# Patient Record
Sex: Male | Born: 1940
Health system: Southern US, Community
[De-identification: ages and names within clinical notes are randomized; demographics above are authoritative.]

## PROBLEM LIST (undated history)

## (undated) DIAGNOSIS — M1990 Unspecified osteoarthritis, unspecified site: Secondary | ICD-10-CM

## (undated) DIAGNOSIS — I714 Abdominal aortic aneurysm, without rupture, unspecified: Secondary | ICD-10-CM

## (undated) DIAGNOSIS — I10 Essential (primary) hypertension: Secondary | ICD-10-CM

## (undated) DIAGNOSIS — C61 Malignant neoplasm of prostate: Secondary | ICD-10-CM

## (undated) DIAGNOSIS — E119 Type 2 diabetes mellitus without complications: Secondary | ICD-10-CM

---

## 2006-07-21 ENCOUNTER — Ambulatory Visit: Payer: Self-pay | Admitting: Internal Medicine

## 2006-07-21 ENCOUNTER — Ambulatory Visit (HOSPITAL_COMMUNITY): Admission: RE | Admit: 2006-07-21 | Discharge: 2006-07-21 | Payer: Self-pay | Admitting: Internal Medicine

## 2006-07-21 ENCOUNTER — Encounter (INDEPENDENT_AMBULATORY_CARE_PROVIDER_SITE_OTHER): Payer: Self-pay | Admitting: Specialist

## 2009-11-07 HISTORY — PX: PROSTATECTOMY: SHX69

## 2010-05-06 ENCOUNTER — Encounter: Payer: Self-pay | Admitting: Urology

## 2010-05-06 ENCOUNTER — Inpatient Hospital Stay (HOSPITAL_COMMUNITY): Admission: RE | Admit: 2010-05-06 | Discharge: 2010-05-07 | Payer: Self-pay | Admitting: Urology

## 2011-01-23 LAB — TYPE AND SCREEN: Antibody Screen: NEGATIVE

## 2011-01-23 LAB — GLUCOSE, CAPILLARY
Glucose-Capillary: 146 mg/dL — ABNORMAL HIGH (ref 70–99)
Glucose-Capillary: 89 mg/dL (ref 70–99)
Glucose-Capillary: 146 mg/dL — ABNORMAL HIGH (ref 70–99)

## 2011-01-23 LAB — COMPREHENSIVE METABOLIC PANEL
ALT: 29 U/L (ref 0–53)
AST: 23 U/L (ref 0–37)
Albumin: 4.1 g/dL (ref 3.5–5.2)
Calcium: 9.7 mg/dL (ref 8.4–10.5)
Chloride: 106 mEq/L (ref 96–112)
Creatinine, Ser: 1.09 mg/dL (ref 0.4–1.5)
GFR calc Af Amer: >60 mL/min (ref >60)
Sodium: 140 mEq/L (ref 135–145)

## 2011-01-23 LAB — SURGICAL PCR SCREEN: MRSA, PCR: NEGATIVE

## 2011-03-25 NOTE — Op Note (Signed)
NAME:  Charles Medina, Charles Medina                  ACCOUNT NO.:  [id number]   MEDICAL RECORD NO.:  [id number]          PATIENT TYPE:  AMB   LOCATION:  DAY                           FACILITY:  [facility]   PHYSICIAN:  Lionel December, M.D.    DATE OF BIRTH:  07-03-1941   DATE OF PROCEDURE:  07/21/2006  DATE OF DISCHARGE:                                 OPERATIVE REPORT   PROCEDURE:  Colonoscopy.   INDICATIONS FOR PROCEDURE:  The patient is a 70 year old African American  male who is undergoing screening colonoscopy.  The procedure and risks were  reviewed with the patient and informed consent was obtained.   MEDICATIONS FOR CONSCIOUS SEDATION:  Demerol 50 mg IV, Versed 2 mg IV.   FINDINGS:  The procedure is performed in the endoscopy suite.  The patient's  vital signs and O2 saturations were monitored during the procedure and  remained stable.  The patient was placed in the left lateral position and  rectal examination performed.  No abnormality noted on external or digital  exam.  The Olympus videoscope was placed in the rectum and advanced under  vision in the sigmoid colon and beyond.  The preparation was excellent.  There were a few tiny diverticula at the sigmoid colon and two more at the  transverse colon.  The scope was advanced to the cecum which was identified  by the appendiceal orifice and ileocecal valve.  Pictures were taken for the  record.  As the scope was withdrawn, colonic mucosa was once again carefully  examined.  There were three tiny polyps at the splenic flexure, possibly  hyperplastic.  These were ablated via cold biopsy and submitted in one  container.  The mucosa of the rest of the colon was normal.  The rectal  mucosa, similarly, was normal.  The scope was retroflexed to examine the  anorectal junction which was unremarkable.  The endoscope was straightened  and withdrawn.  The patient tolerated the procedure well.   FINAL DIAGNOSIS:  1. A few small diverticula at the sigmoid  colon and transverse colon.  2. Three tiny polyps at the splenic flexure which were ablated by cold      biopsy and submitted in one container.   RECOMMENDATIONS:  1. Standard instructions given.  2. High fiber diet.  3. I will be contacting the patient with results of the biopsy and further      recommendations, if any.      Lionel December, M.D.  Electronically Signed     NR/MEDQ  D:  07/21/2006  T:  07/22/2006  Job:  [id number]   cc:   Lorin Picket A. Gerda Diss, MD  Fax: [phone]

## 2011-05-27 ENCOUNTER — Ambulatory Visit (INDEPENDENT_AMBULATORY_CARE_PROVIDER_SITE_OTHER): Payer: Medicare Other | Admitting: Urology

## 2011-05-27 DIAGNOSIS — Z8546 Personal history of malignant neoplasm of prostate: Secondary | ICD-10-CM

## 2011-05-27 DIAGNOSIS — N529 Male erectile dysfunction, unspecified: Secondary | ICD-10-CM

## 2011-05-27 DIAGNOSIS — N393 Stress incontinence (female) (male): Secondary | ICD-10-CM

## 2011-07-12 ENCOUNTER — Encounter (INDEPENDENT_AMBULATORY_CARE_PROVIDER_SITE_OTHER): Payer: Self-pay | Admitting: *Deleted

## 2012-01-02 DIAGNOSIS — Z8546 Personal history of malignant neoplasm of prostate: Secondary | ICD-10-CM | POA: Diagnosis not present

## 2012-01-05 DIAGNOSIS — E119 Type 2 diabetes mellitus without complications: Secondary | ICD-10-CM | POA: Diagnosis not present

## 2012-01-05 DIAGNOSIS — Z794 Long term (current) use of insulin: Secondary | ICD-10-CM | POA: Diagnosis not present

## 2012-01-27 ENCOUNTER — Ambulatory Visit (INDEPENDENT_AMBULATORY_CARE_PROVIDER_SITE_OTHER): Payer: Medicare Other | Admitting: Urology

## 2012-01-27 DIAGNOSIS — N529 Male erectile dysfunction, unspecified: Secondary | ICD-10-CM

## 2012-01-27 DIAGNOSIS — Z8546 Personal history of malignant neoplasm of prostate: Secondary | ICD-10-CM | POA: Diagnosis not present

## 2012-01-27 DIAGNOSIS — N393 Stress incontinence (female) (male): Secondary | ICD-10-CM | POA: Diagnosis not present

## 2012-07-24 DIAGNOSIS — Z8546 Personal history of malignant neoplasm of prostate: Secondary | ICD-10-CM | POA: Diagnosis not present

## 2012-07-27 ENCOUNTER — Ambulatory Visit (INDEPENDENT_AMBULATORY_CARE_PROVIDER_SITE_OTHER): Payer: Medicare Other | Admitting: Urology

## 2012-07-27 DIAGNOSIS — N529 Male erectile dysfunction, unspecified: Secondary | ICD-10-CM

## 2012-07-27 DIAGNOSIS — N393 Stress incontinence (female) (male): Secondary | ICD-10-CM

## 2012-07-27 DIAGNOSIS — Z8546 Personal history of malignant neoplasm of prostate: Secondary | ICD-10-CM

## 2012-08-21 DIAGNOSIS — Z23 Encounter for immunization: Secondary | ICD-10-CM | POA: Diagnosis not present

## 2012-08-21 DIAGNOSIS — Z79899 Other long term (current) drug therapy: Secondary | ICD-10-CM | POA: Diagnosis not present

## 2012-08-21 DIAGNOSIS — E785 Hyperlipidemia, unspecified: Secondary | ICD-10-CM | POA: Diagnosis not present

## 2012-08-21 DIAGNOSIS — E119 Type 2 diabetes mellitus without complications: Secondary | ICD-10-CM | POA: Diagnosis not present

## 2012-08-21 DIAGNOSIS — E782 Mixed hyperlipidemia: Secondary | ICD-10-CM | POA: Diagnosis not present

## 2012-08-21 DIAGNOSIS — I10 Essential (primary) hypertension: Secondary | ICD-10-CM | POA: Diagnosis not present

## 2012-08-21 DIAGNOSIS — E1129 Type 2 diabetes mellitus with other diabetic kidney complication: Secondary | ICD-10-CM | POA: Diagnosis not present

## 2012-09-05 ENCOUNTER — Other Ambulatory Visit (INDEPENDENT_AMBULATORY_CARE_PROVIDER_SITE_OTHER): Payer: Self-pay | Admitting: *Deleted

## 2012-09-05 ENCOUNTER — Telehealth (INDEPENDENT_AMBULATORY_CARE_PROVIDER_SITE_OTHER): Payer: Self-pay | Admitting: *Deleted

## 2012-09-05 ENCOUNTER — Encounter (INDEPENDENT_AMBULATORY_CARE_PROVIDER_SITE_OTHER): Payer: Self-pay | Admitting: *Deleted

## 2012-09-05 DIAGNOSIS — Z86010 Personal history of colonic polyps: Secondary | ICD-10-CM

## 2012-09-05 NOTE — Telephone Encounter (Signed)
Patient needs movi prep 

## 2012-09-07 MED ORDER — PEG-KCL-NACL-NASULF-NA ASC-C 100 G PO SOLR: 1.0000 | Freq: Once | ORAL | Status: DC

## 2012-10-17 ENCOUNTER — Telehealth (INDEPENDENT_AMBULATORY_CARE_PROVIDER_SITE_OTHER): Payer: Self-pay | Admitting: *Deleted

## 2012-10-17 NOTE — Telephone Encounter (Signed)
agree

## 2012-10-17 NOTE — Telephone Encounter (Signed)
  Procedure: tcs  Reason/Indication:  Hx polyps  Has patient had this procedure before?  yes  If so, when, by whom and where?  5 yrs ago ([facility])  Is there a family history of colon cancer?  no  Who?  What age when diagnosed?    Is patient diabetic?   yes      Does patient have prosthetic heart valve?  no  Do you have a pacemaker?  no  Has patient had joint replacement within last 12 months?  no  Is patient on Coumadin, Plavix and/or Aspirin? no  Medications: glyburide 2.5 mg 2 tab in morning and 1 tab in evening, metformin 1000 mg daily, lisinopril 10 mg daily, pravastatin 80 mg daily  Allergies: nkda  Medication Adjustment: hold evening dose of glyburide evening before, 1/2 metformin day before, hold both morning of   Procedure date & time: 11/14/12 at 830

## 2012-10-29 ENCOUNTER — Encounter (HOSPITAL_COMMUNITY): Payer: Self-pay | Admitting: Pharmacy Technician

## 2012-11-14 ENCOUNTER — Ambulatory Visit (HOSPITAL_COMMUNITY)
Admission: RE | Admit: 2012-11-14 | Discharge: 2012-11-14 | Disposition: A | Discharge status: Home / Self Care | Payer: Medicare Other | Source: Ambulatory Visit | Attending: Internal Medicine | Admitting: Internal Medicine

## 2012-11-14 ENCOUNTER — Encounter (HOSPITAL_COMMUNITY): Payer: Self-pay | Admitting: *Deleted

## 2012-11-14 ENCOUNTER — Encounter (HOSPITAL_COMMUNITY)
Admission: RE | Disposition: A | Discharge status: Home / Self Care | Payer: Self-pay | Source: Ambulatory Visit | Attending: Internal Medicine

## 2012-11-14 DIAGNOSIS — K644 Residual hemorrhoidal skin tags: Secondary | ICD-10-CM | POA: Diagnosis not present

## 2012-11-14 DIAGNOSIS — D126 Benign neoplasm of colon, unspecified: Secondary | ICD-10-CM

## 2012-11-14 DIAGNOSIS — K648 Other hemorrhoids: Secondary | ICD-10-CM | POA: Diagnosis not present

## 2012-11-14 DIAGNOSIS — E119 Type 2 diabetes mellitus without complications: Secondary | ICD-10-CM | POA: Insufficient documentation

## 2012-11-14 DIAGNOSIS — K5730 Diverticulosis of large intestine without perforation or abscess without bleeding: Secondary | ICD-10-CM | POA: Diagnosis not present

## 2012-11-14 DIAGNOSIS — I10 Essential (primary) hypertension: Secondary | ICD-10-CM | POA: Diagnosis not present

## 2012-11-14 DIAGNOSIS — Z860100 Personal history of colon polyps, unspecified: Secondary | ICD-10-CM | POA: Insufficient documentation

## 2012-11-14 HISTORY — PX: COLONOSCOPY: SHX5424

## 2012-11-14 HISTORY — DX: Unspecified osteoarthritis, unspecified site: M19.90

## 2012-11-14 HISTORY — DX: Type 2 diabetes mellitus without complications: E11.9

## 2012-11-14 HISTORY — DX: Essential (primary) hypertension: I10

## 2012-11-14 SURGERY — COLONOSCOPY
Anesthesia: Moderate Sedation

## 2012-11-14 MED ORDER — MEPERIDINE HCL 50 MG/ML IJ SOLN
INTRAMUSCULAR | Status: AC
  Filled 2012-11-14: qty 1

## 2012-11-14 MED ORDER — MIDAZOLAM HCL 5 MG/5ML IJ SOLN
INTRAMUSCULAR | Status: DC | PRN
  Administered 2012-11-14 (×2): 2 mg via INTRAVENOUS

## 2012-11-14 MED ORDER — MIDAZOLAM HCL 5 MG/5ML IJ SOLN
INTRAMUSCULAR | Status: AC
  Filled 2012-11-14: qty 10

## 2012-11-14 MED ORDER — STERILE WATER FOR IRRIGATION IR SOLN
Status: DC | PRN
  Administered 2012-11-14: 09:00:00

## 2012-11-14 MED ORDER — SODIUM CHLORIDE 0.45 % IV SOLN
INTRAVENOUS | Status: DC
  Administered 2012-11-14: 1000 mL via INTRAVENOUS

## 2012-11-14 MED ORDER — MEPERIDINE HCL 50 MG/ML IJ SOLN
INTRAMUSCULAR | Status: DC | PRN
  Administered 2012-11-14 (×2): 25 mg via INTRAVENOUS

## 2012-11-14 NOTE — H&P (Signed)
Charles Medina is an 72 y.o. male.   Chief Complaint: Patient is here for colonoscopy. HPI: Patient is 72 year old African male with history of colonic adenomas and is here for surveillance colonoscopy. His last exam was in September 2007 with removal of 3 small polyps and 2 were adenomatous. He feels well. He denies abdominal pain change in his bowel habits or rectal bleeding. Family history is negative for colorectal carcinoma. He has history of prostate CA and had robotic surgery in 2012 and remains in remission.  Past Medical History  Diagnosis Date  . Hypertension   . Diabetes mellitus without complication   . Arthritis     Past Surgical History  Procedure Date  . Prostatectomy 2012    History reviewed. No pertinent family history. Social History:  reports that he quit smoking about 29 years ago. He has never used smokeless tobacco. He reports that he does not drink alcohol or use illicit drugs.  Allergies: No Known Allergies  Medications Prior to Admission  Medication Sig Dispense Refill  . glyBURIDE (DIABETA) 2.5 MG tablet Take 2.5-5 mg by mouth 2 (two) times daily. 2 in the morning and 1 at night.      Marland Kitchen lisinopril (PRINIVIL,ZESTRIL) 10 MG tablet Take 10 mg by mouth daily.      . metFORMIN (GLUCOPHAGE) 1000 MG tablet Take 1,000 mg by mouth daily with breakfast.      . peg 3350 powder (MOVIPREP) 100 G SOLR Take 1 kit (100 g total) by mouth once.  1 kit  0  . pravastatin (PRAVACHOL) 80 MG tablet Take 80 mg by mouth daily.         No results found for this or any previous visit (from the past 48 hour(s)). No results found.  ROS  Blood pressure 158/87, pulse 72, temperature 98 F (36.7 C), temperature source Oral, resp. rate 18, height 6' (1.829 m), weight 186 lb (84.369 kg), SpO2 99.00%. Physical Exam  Constitutional: He appears well-developed and well-nourished.  HENT:  Mouth/Throat: Oropharynx is clear and moist.  Eyes: Conjunctivae normal are normal. No scleral  icterus.  Neck: No thyromegaly present.  Cardiovascular: Normal rate, regular rhythm and normal heart sounds.   No murmur heard. Respiratory: Effort normal and breath sounds normal.  GI: Soft. He exhibits no distension and no mass. There is no tenderness.  Musculoskeletal: He exhibits no edema.  Lymphadenopathy:    He has no cervical adenopathy.  Neurological: He is alert.  Skin: Skin is warm and dry.     Assessment/Plan History of colonic adenomas. Surveillance colonoscopy.  REHMAN,NAJEEB U 11/14/2012, 8:37 AM

## 2012-11-14 NOTE — Op Note (Signed)
COLONOSCOPY PROCEDURE REPORT  PATIENT:  Charles Medina  MR#:  [id number] Birthdate:  Jan 12, 1941, 72 y.o., male Endoscopist:  Dr. Malissa Hippo, MD Referred By:  Dr. Lilyan Punt, MD Procedure Date: 11/14/2012  Procedure:   Colonoscopy  Indications:  Patient is 72 year old African male with history of colonic adenomas. His last exam was in September 2007.  Informed Consent:  The procedure and risks were reviewed with the patient and informed consent was obtained.  Medications:  Demerol 50 mg IV Versed 4 mg IV  Description of procedure:  After a digital rectal exam was performed, that colonoscope was advanced from the anus through the rectum and colon to the area of the cecum, ileocecal valve and appendiceal orifice. The cecum was deeply intubated. These structures were well-seen and photographed for the record. From the level of the cecum and ileocecal valve, the scope was slowly and cautiously withdrawn. The mucosal surfaces were carefully surveyed utilizing scope tip to flexion to facilitate fold flattening as needed. The scope was pulled down into the rectum where a thorough exam including retroflexion was performed.  Findings:   Prep excellent. Scattered diverticula throughout the colon. Two small polyps ablated via cold biopsy from ascending colon and submitted together. Another small polyp ablated via cold biopsy from transverse colon. Normal rectal mucosa. Small hemorrhoids above and below the dentate line.  Therapeutic/Diagnostic Maneuvers Performed:  See above  Complications:  None  Cecal Withdrawal Time:  19 minutes  Impression:  Examination performed to cecum. Pancolonic diverticulosis(few diverticula scattered throughout the colon). Three small polyps ablated via cold biopsy;  two of these were ascending colon and submitted together. Third polyp was at transverse colon. Small internal/external hemorrhoids.  Recommendations:  Standard instructions given. I will contact  patient with results of biopsy and further recommendations.  Charles Medina  11/14/2012 9:22 AM  CC: Dr. Lilyan Punt, MD & Dr. No ref. provider found

## 2012-11-19 ENCOUNTER — Encounter (HOSPITAL_COMMUNITY): Payer: Self-pay | Admitting: Internal Medicine

## 2012-11-21 ENCOUNTER — Encounter (INDEPENDENT_AMBULATORY_CARE_PROVIDER_SITE_OTHER): Payer: Self-pay | Admitting: *Deleted

## 2013-01-28 ENCOUNTER — Encounter: Payer: Self-pay | Admitting: *Deleted

## 2013-01-28 DIAGNOSIS — Z8546 Personal history of malignant neoplasm of prostate: Secondary | ICD-10-CM | POA: Diagnosis not present

## 2013-01-28 DIAGNOSIS — E785 Hyperlipidemia, unspecified: Secondary | ICD-10-CM | POA: Insufficient documentation

## 2013-01-28 DIAGNOSIS — I10 Essential (primary) hypertension: Secondary | ICD-10-CM | POA: Insufficient documentation

## 2013-02-01 ENCOUNTER — Ambulatory Visit (INDEPENDENT_AMBULATORY_CARE_PROVIDER_SITE_OTHER): Payer: Medicare Other | Admitting: Urology

## 2013-02-01 DIAGNOSIS — C61 Malignant neoplasm of prostate: Secondary | ICD-10-CM | POA: Diagnosis not present

## 2013-02-01 DIAGNOSIS — N529 Male erectile dysfunction, unspecified: Secondary | ICD-10-CM

## 2013-02-01 DIAGNOSIS — Z8546 Personal history of malignant neoplasm of prostate: Secondary | ICD-10-CM | POA: Diagnosis not present

## 2013-02-01 DIAGNOSIS — N393 Stress incontinence (female) (male): Secondary | ICD-10-CM | POA: Diagnosis not present

## 2013-02-19 ENCOUNTER — Other Ambulatory Visit: Payer: Self-pay | Admitting: *Deleted

## 2013-02-19 MED ORDER — PRAVASTATIN SODIUM 80 MG PO TABS: 80.0000 mg | ORAL_TABLET | Freq: Every day | ORAL | Status: DC

## 2013-02-19 MED ORDER — LISINOPRIL 10 MG PO TABS: 10.0000 mg | ORAL_TABLET | Freq: Every day | ORAL | Status: DC

## 2013-02-19 MED ORDER — GLYBURIDE 2.5 MG PO TABS: 2.5000 mg | ORAL_TABLET | Freq: Two times a day (BID) | ORAL | Status: DC

## 2013-02-19 MED ORDER — GLYBURIDE 5 MG PO TABS: 5.0000 mg | ORAL_TABLET | Freq: Every day | ORAL | Status: DC

## 2013-02-28 ENCOUNTER — Ambulatory Visit (INDEPENDENT_AMBULATORY_CARE_PROVIDER_SITE_OTHER): Payer: Medicare Other | Admitting: Family Medicine

## 2013-02-28 ENCOUNTER — Encounter: Payer: Self-pay | Admitting: Family Medicine

## 2013-02-28 VITALS — BP 148/88 | Wt 186.2 lb

## 2013-02-28 DIAGNOSIS — I10 Essential (primary) hypertension: Secondary | ICD-10-CM | POA: Diagnosis not present

## 2013-02-28 DIAGNOSIS — E785 Hyperlipidemia, unspecified: Secondary | ICD-10-CM

## 2013-02-28 LAB — POCT GLYCOSYLATED HEMOGLOBIN (HGB A1C): Hemoglobin A1C: 8

## 2013-02-28 MED ORDER — GLIPIZIDE 5 MG PO TABS: 5.0000 mg | ORAL_TABLET | Freq: Two times a day (BID) | ORAL | Status: DC

## 2013-02-28 NOTE — Patient Instructions (Addendum)
Increase your glipizide to one tablet in am and one with supper Continue the rest as is Follow up in 3 to 4 months Do your labs please

## 2013-02-28 NOTE — Progress Notes (Signed)
  Subjective:    Patient ID: Charles Medina, male    DOB: 09-16-1941, 72 y.o.   MRN: [id number]  Diabetes He presents for his follow-up diabetic visit. He has type 2 diabetes mellitus. The initial diagnosis of diabetes was made 9 years ago. His disease course has been stable. There are no hypoglycemic associated symptoms. Pertinent negatives for diabetes include no blurred vision, no chest pain, no foot paresthesias, no foot ulcerations, no polydipsia, no polyuria, no weakness and no weight loss. Symptoms are stable. There are no diabetic complications. Risk factors for coronary artery disease include dyslipidemia, diabetes mellitus and male sex. Current diabetic treatment includes diet and oral agent (dual therapy). He is compliant with treatment all of the time. His weight is stable. He is following a diabetic and generally healthy diet. Meal planning includes avoidance of concentrated sweets. He participates in exercise intermittently. His breakfast blood glucose is taken between 6-7 am. His breakfast blood glucose range is generally 110-130 mg/dl. An ACE inhibitor/angiotensin II receptor blocker is being taken. He does not see a podiatrist.Eye exam is current.      Review of Systems  Constitutional: Negative for weight loss.  Eyes: Negative for blurred vision.  Cardiovascular: Negative for chest pain.  Endocrine: Negative for polydipsia and polyuria.  Neurological: Negative for weakness.       Objective:   Physical Exam  Vitals reviewed. Constitutional: He appears well-developed and well-nourished.  HENT:  Head: Normocephalic and atraumatic.  Right Ear: External ear normal.  Left Ear: External ear normal.  Nose: Nose normal.  Mouth/Throat: Oropharynx is clear and moist.  Eyes: EOM are normal. Pupils are equal, round, and reactive to light.  Neck: Normal range of motion. Neck supple. No thyromegaly present.  Cardiovascular: Normal rate, regular rhythm and normal heart sounds.   No  murmur heard. Pulmonary/Chest: Effort normal and breath sounds normal. No respiratory distress. He has no wheezes.  Abdominal: Soft. Bowel sounds are normal. He exhibits no distension and no mass. There is no tenderness.  Musculoskeletal: Normal range of motion. He exhibits no edema.  Diabetic foot exam was completed. He does not have any ulcers. In no neuropathy. He does have bunions and calluses. Some foot deformity 2. He does take good care of his feet.  Lymphadenopathy:    He has no cervical adenopathy.  Neurological: He is alert. He exhibits normal muscle tone.  Skin: Skin is warm and dry. No erythema.  Psychiatric: He has a normal mood and affect. His behavior is normal. Judgment normal.          Assessment & Plan:  Type II or unspecified type diabetes mellitus without mention of complication, uncontrolled - Plan: POCT glycosylated hemoglobin (Hb A1C), Microalbumin, urine  Hypertension - Plan: Basic metabolic panel  Other and unspecified hyperlipidemia - Plan: Lipid panel, Hepatic function panel  This patient was encouraged to increase his medication. Glipizide 5 mg tablet in the morning and again at suppertime. He is continue his other medicines. In addition to this he will do his lab work. I like to see him back in 3-4 months to check hemoglobin A1c at that time.

## 2013-03-12 DIAGNOSIS — E785 Hyperlipidemia, unspecified: Secondary | ICD-10-CM | POA: Diagnosis not present

## 2013-03-12 DIAGNOSIS — I10 Essential (primary) hypertension: Secondary | ICD-10-CM | POA: Diagnosis not present

## 2013-03-12 LAB — BASIC METABOLIC PANEL
BUN: 14 mg/dL (ref 6–23)
CO2: 24 mEq/L (ref 19–32)
Calcium: 9.2 mg/dL (ref 8.4–10.5)
Creat: 1.09 mg/dL (ref 0.50–1.35)
Glucose, Bld: 117 mg/dL — ABNORMAL HIGH (ref 70–99)
Sodium: 139 mEq/L (ref 135–145)

## 2013-03-12 LAB — LIPID PANEL
Cholesterol: 155 mg/dL (ref 0–200)
HDL: 30 mg/dL — ABNORMAL LOW (ref >39)
Total CHOL/HDL Ratio: 5.2 Ratio
Triglycerides: 56 mg/dL (ref <150)

## 2013-03-12 LAB — HEPATIC FUNCTION PANEL
Albumin: 4.3 g/dL (ref 3.5–5.2)
Alkaline Phosphatase: 62 U/L (ref 39–117)
Total Protein: 7.1 g/dL (ref 6.0–8.3)

## 2013-03-13 ENCOUNTER — Other Ambulatory Visit: Payer: Self-pay | Admitting: *Deleted

## 2013-03-13 MED ORDER — ATORVASTATIN CALCIUM 40 MG PO TABS: 40.0000 mg | ORAL_TABLET | Freq: Every day | ORAL | Status: DC

## 2013-05-02 DIAGNOSIS — H524 Presbyopia: Secondary | ICD-10-CM | POA: Diagnosis not present

## 2013-05-02 DIAGNOSIS — E119 Type 2 diabetes mellitus without complications: Secondary | ICD-10-CM | POA: Diagnosis not present

## 2013-05-02 DIAGNOSIS — H5200 Hypermetropia, unspecified eye: Secondary | ICD-10-CM | POA: Diagnosis not present

## 2013-05-02 DIAGNOSIS — H52229 Regular astigmatism, unspecified eye: Secondary | ICD-10-CM | POA: Diagnosis not present

## 2013-05-13 ENCOUNTER — Other Ambulatory Visit: Payer: Self-pay | Admitting: Family Medicine

## 2013-05-30 ENCOUNTER — Ambulatory Visit (INDEPENDENT_AMBULATORY_CARE_PROVIDER_SITE_OTHER): Payer: Medicare Other | Admitting: Family Medicine

## 2013-05-30 ENCOUNTER — Encounter: Payer: Self-pay | Admitting: Family Medicine

## 2013-05-30 VITALS — BP 130/80 | Wt 183.4 lb

## 2013-05-30 DIAGNOSIS — E119 Type 2 diabetes mellitus without complications: Secondary | ICD-10-CM | POA: Diagnosis not present

## 2013-05-30 DIAGNOSIS — I10 Essential (primary) hypertension: Secondary | ICD-10-CM | POA: Diagnosis not present

## 2013-05-30 DIAGNOSIS — E785 Hyperlipidemia, unspecified: Secondary | ICD-10-CM

## 2013-05-30 LAB — POCT GLYCOSYLATED HEMOGLOBIN (HGB A1C): Hemoglobin A1C: 7.1

## 2013-05-30 NOTE — Progress Notes (Signed)
  Subjective:    Patient ID: Charles Medina, male    DOB: 1941-05-03, 72 y.o.   MRN: [id number]  HPI Patient arrives to follow up on diabetes. No Problems. This patient states she's overall doing well with diet and exercise he is also doing well with taking his medication. He denies any excessively high sugars denies blurred vision excessive thirst urination. Denies low spells. Past medical history hyperlipidemia hypertension diabetes Social does not smoke   Review of Systems Denies chest pain shortness of breath. States he's eating well and exercising    Objective:   Physical Exam Blood pressure is good lungs are clear no crackles heart is regular no murmurs pulses are normal foot exam noted.       Assessment & Plan:  Diabetes good control overall lab work will be done in the fall time. I am very pleased with the progress this patient is making see back in the fall. Flu vaccine recommended.

## 2013-07-09 DIAGNOSIS — E785 Hyperlipidemia, unspecified: Secondary | ICD-10-CM | POA: Diagnosis not present

## 2013-07-10 ENCOUNTER — Encounter: Payer: Self-pay | Admitting: Family Medicine

## 2013-07-10 LAB — LIPID PANEL
LDL Cholesterol: 86 mg/dL (ref 0–99)
Triglycerides: 84 mg/dL (ref <150)

## 2013-07-16 ENCOUNTER — Other Ambulatory Visit: Payer: Self-pay | Admitting: *Deleted

## 2013-07-16 MED ORDER — ATORVASTATIN CALCIUM 40 MG PO TABS: 40.0000 mg | ORAL_TABLET | Freq: Every day | ORAL | Status: DC

## 2013-08-21 DIAGNOSIS — Z8546 Personal history of malignant neoplasm of prostate: Secondary | ICD-10-CM | POA: Diagnosis not present

## 2013-09-19 ENCOUNTER — Other Ambulatory Visit: Payer: Self-pay | Admitting: Family Medicine

## 2013-11-08 ENCOUNTER — Ambulatory Visit (INDEPENDENT_AMBULATORY_CARE_PROVIDER_SITE_OTHER): Payer: Medicare Other | Admitting: Urology

## 2013-11-08 ENCOUNTER — Encounter (INDEPENDENT_AMBULATORY_CARE_PROVIDER_SITE_OTHER): Payer: Self-pay

## 2013-11-08 DIAGNOSIS — N529 Male erectile dysfunction, unspecified: Secondary | ICD-10-CM

## 2013-11-08 DIAGNOSIS — N393 Stress incontinence (female) (male): Secondary | ICD-10-CM

## 2013-11-08 DIAGNOSIS — Z8546 Personal history of malignant neoplasm of prostate: Secondary | ICD-10-CM

## 2013-11-28 ENCOUNTER — Other Ambulatory Visit: Payer: Self-pay | Admitting: Family Medicine

## 2013-12-12 ENCOUNTER — Ambulatory Visit (INDEPENDENT_AMBULATORY_CARE_PROVIDER_SITE_OTHER): Payer: Medicare Other | Admitting: Family Medicine

## 2013-12-12 ENCOUNTER — Encounter: Payer: Self-pay | Admitting: Family Medicine

## 2013-12-12 VITALS — BP 132/88 | Ht 71.5 in | Wt 189.0 lb

## 2013-12-12 DIAGNOSIS — IMO0001 Reserved for inherently not codable concepts without codable children: Secondary | ICD-10-CM

## 2013-12-12 DIAGNOSIS — E119 Type 2 diabetes mellitus without complications: Secondary | ICD-10-CM

## 2013-12-12 DIAGNOSIS — I10 Essential (primary) hypertension: Secondary | ICD-10-CM | POA: Diagnosis not present

## 2013-12-12 DIAGNOSIS — Z23 Encounter for immunization: Secondary | ICD-10-CM

## 2013-12-12 DIAGNOSIS — E1165 Type 2 diabetes mellitus with hyperglycemia: Secondary | ICD-10-CM

## 2013-12-12 LAB — POCT GLYCOSYLATED HEMOGLOBIN (HGB A1C): HEMOGLOBIN A1C: 7

## 2013-12-12 NOTE — Progress Notes (Signed)
   Subjective:    Patient ID: Charles Medina, male    DOB: 11-02-1941, 73 y.o.   MRN: [id number]  HPIDiabetic check up. Blood sugar 69 - 122. Patient states she is trying eat healthy he relates he is trying to watch his diet. He also relates he is staying physically active he denies any chest tightness pressure pain shortness breath. He states he's taken his medicines as directed. Denies any complications of diabetes hypertension or hyperlipidemia. Patient does not smoke  Requesting tetanus vaccine. Vaccine given today.  The patient was seen today as part of a comprehensive diabetic check up. The patient had the following elements completed: -Review of medication compliance -Review of glucose monitoring results -Review of any complications do to high or low sugars -Diabetic foot exam was completed as part of today's visit. The following was also discussed: -Importance of yearly eye exams -Importance of following diabetic/low sugar-starch diet -Importance of exercise and regular activity -Importance of regular followup visits. -Most recent hemoglobin A1c were reviewed with the patient along with goals regarding diabetes.     Review of Systems  Constitutional: Negative for fever, activity change and appetite change.  HENT: Negative for congestion and rhinorrhea.   Eyes: Negative for discharge.  Respiratory: Negative for cough and wheezing.   Cardiovascular: Negative for chest pain.  Gastrointestinal: Negative for vomiting, abdominal pain and blood in stool.  Genitourinary: Negative for frequency and difficulty urinating.  Musculoskeletal: Negative for neck pain.  Skin: Negative for rash.  Allergic/Immunologic: Negative for environmental allergies and food allergies.  Neurological: Negative for weakness and headaches.  Psychiatric/Behavioral: Negative for agitation.       Objective:   Physical Exam  Constitutional: He appears well-developed and well-nourished.  HENT:  Head:  Normocephalic and atraumatic.  Right Ear: External ear normal.  Left Ear: External ear normal.  Nose: Nose normal.  Mouth/Throat: Oropharynx is clear and moist.  Neck: Neck supple. No thyromegaly present.  Cardiovascular: Normal rate, regular rhythm and normal heart sounds.   No murmur heard. Pulmonary/Chest: Effort normal and breath sounds normal. No respiratory distress. He has no wheezes.  Abdominal: Soft. Bowel sounds are normal. He exhibits no distension and no mass. There is no tenderness.  Musculoskeletal: Normal range of motion. He exhibits no edema.  Lymphadenopathy:    He has no cervical adenopathy.  Neurological: He is alert. He exhibits normal muscle tone.  Skin: Skin is warm and dry. No erythema.  Psychiatric: He has a normal mood and affect. His behavior is normal. Judgment normal.          Assessment & Plan:  DM-overall doing well with this encourage patient continue medication watch diet exercise on a regular basis  Did his eye exam last year. He reports as being good.  HTN-under very good control continue current measures. Watch diet closely.  Hyperlip-numbers look great, continue current medication. Followup if ongoing troubles.  Significant orthopedic issues with his feet as well as peripheral neuropathy. Patient should be able to qualify for diabetic shoes.

## 2013-12-13 ENCOUNTER — Other Ambulatory Visit: Payer: Self-pay | Admitting: Family Medicine

## 2013-12-17 DIAGNOSIS — I10 Essential (primary) hypertension: Secondary | ICD-10-CM | POA: Diagnosis not present

## 2013-12-17 DIAGNOSIS — E119 Type 2 diabetes mellitus without complications: Secondary | ICD-10-CM | POA: Diagnosis not present

## 2013-12-17 LAB — LIPID PANEL
CHOLESTEROL: 121 mg/dL (ref 0–200)
HDL: 29 mg/dL — ABNORMAL LOW (ref >39)
LDL Cholesterol: 80 mg/dL (ref 0–99)
Total CHOL/HDL Ratio: 4.2 Ratio
Triglycerides: 60 mg/dL (ref <150)
VLDL: 12 mg/dL (ref 0–40)

## 2013-12-17 LAB — BASIC METABOLIC PANEL
BUN: 12 mg/dL (ref 6–23)
CHLORIDE: 105 meq/L (ref 96–112)
CO2: 27 meq/L (ref 19–32)
Calcium: 9 mg/dL (ref 8.4–10.5)
Creat: 0.98 mg/dL (ref 0.50–1.35)
GLUCOSE: 116 mg/dL — AB (ref 70–99)
POTASSIUM: 4.7 meq/L (ref 3.5–5.3)
SODIUM: 139 meq/L (ref 135–145)

## 2013-12-17 LAB — HEPATIC FUNCTION PANEL
ALT: 21 U/L (ref 0–53)
AST: 18 U/L (ref 0–37)
Albumin: 4.2 g/dL (ref 3.5–5.2)
Alkaline Phosphatase: 78 U/L (ref 39–117)
BILIRUBIN DIRECT: 0.1 mg/dL (ref 0.0–0.3)
BILIRUBIN INDIRECT: 0.2 mg/dL (ref 0.2–1.2)
BILIRUBIN TOTAL: 0.3 mg/dL (ref 0.2–1.2)
Total Protein: 6.8 g/dL (ref 6.0–8.3)

## 2013-12-18 LAB — MICROALBUMIN, URINE: MICROALB UR: 4.78 mg/dL — AB (ref 0.00–1.89)

## 2013-12-19 ENCOUNTER — Encounter: Payer: Self-pay | Admitting: Family Medicine

## 2014-03-04 ENCOUNTER — Other Ambulatory Visit: Payer: Self-pay | Admitting: Family Medicine

## 2014-03-15 ENCOUNTER — Other Ambulatory Visit: Payer: Self-pay | Admitting: Family Medicine

## 2014-04-23 ENCOUNTER — Other Ambulatory Visit: Payer: Self-pay | Admitting: Family Medicine

## 2014-04-28 ENCOUNTER — Encounter: Payer: Medicare Other | Admitting: Family Medicine

## 2014-05-13 ENCOUNTER — Ambulatory Visit (INDEPENDENT_AMBULATORY_CARE_PROVIDER_SITE_OTHER): Payer: Medicare Other | Admitting: Family Medicine

## 2014-05-13 ENCOUNTER — Encounter: Payer: Self-pay | Admitting: Family Medicine

## 2014-05-13 VITALS — BP 130/80 | Ht 68.5 in | Wt 177.2 lb

## 2014-05-13 DIAGNOSIS — Z23 Encounter for immunization: Secondary | ICD-10-CM | POA: Diagnosis not present

## 2014-05-13 DIAGNOSIS — Z0000 Encounter for general adult medical examination without abnormal findings: Secondary | ICD-10-CM | POA: Diagnosis not present

## 2014-05-13 DIAGNOSIS — Z8546 Personal history of malignant neoplasm of prostate: Secondary | ICD-10-CM | POA: Diagnosis not present

## 2014-05-13 NOTE — Progress Notes (Signed)
   Subjective:    Patient ID: Charles Medina, male    DOB: 12-09-1940, 73 y.o.   MRN: [id number]  HPI AWV- Annual Wellness Visit  The patient was seen for their annual wellness visit. The patient's past medical history, surgical history, and family history were reviewed. Pertinent vaccines were reviewed ( tetanus, pneumonia, shingles, flu) The patient's medication list was reviewed and updated.  The height and weight were entered. The patient's current BMI is: 26.56  Cognitive screening was completed. Outcome of Mini - Cog: passed  Falls within the past 6 months:none  Current tobacco usage: non-smoker (All patients who use tobacco were given written and verbal information on quitting)  Recent listing of emergency department/hospitalizations over the past year were reviewed.  current specialist the patient sees on a regular basis: none   Medicare annual wellness visit patient questionnaire was reviewed.  A written screening schedule for the patient for the next 5-10 years was given. Appropriate discussion of followup regarding next visit was discussed.      Review of Systems  Constitutional: Negative for fever, activity change and appetite change.  HENT: Negative for congestion and rhinorrhea.   Eyes: Negative for discharge.  Respiratory: Negative for cough and wheezing.   Cardiovascular: Negative for chest pain.  Gastrointestinal: Negative for vomiting, abdominal pain and blood in stool.  Genitourinary: Negative for frequency and difficulty urinating.  Musculoskeletal: Negative for neck pain.  Skin: Negative for rash.  Allergic/Immunologic: Negative for environmental allergies and food allergies.  Neurological: Negative for weakness and headaches.  Psychiatric/Behavioral: Negative for agitation.       Objective:   Physical Exam  Constitutional: He appears well-developed and well-nourished.  HENT:  Head: Normocephalic and atraumatic.  Right Ear: External ear normal.    Left Ear: External ear normal.  Nose: Nose normal.  Mouth/Throat: Oropharynx is clear and moist.  Eyes: EOM are normal. Pupils are equal, round, and reactive to light.  Neck: Normal range of motion. Neck supple. No thyromegaly present.  Cardiovascular: Normal rate, regular rhythm and normal heart sounds.   No murmur heard. Pulmonary/Chest: Effort normal and breath sounds normal. No respiratory distress. He has no wheezes.  Abdominal: Soft. Bowel sounds are normal. He exhibits no distension and no mass. There is no tenderness.  Genitourinary: Rectum normal.  Musculoskeletal: Normal range of motion. He exhibits no edema.  Lymphadenopathy:    He has no cervical adenopathy.  Neurological: He is alert. He exhibits normal muscle tone.  Skin: Skin is warm and dry. No erythema.  Psychiatric: He has a normal mood and affect. His behavior is normal. Judgment normal.          Assessment & Plan:  Patient is up-to-date on colonoscopy will need the next one in 2019 Patient had a prostatectomy from prostate cancer does not need PSA Shingles vaccine prescription given Patient encouraged the healthy stay physically active. Pneumonia vaccine given today. Safety measures dietary measures discussed Patient able to take care of himself and drive without difficulty He is to followup in 6-8 weeks for diabetic checkup

## 2014-05-16 ENCOUNTER — Ambulatory Visit (INDEPENDENT_AMBULATORY_CARE_PROVIDER_SITE_OTHER): Payer: Medicare Other | Admitting: Urology

## 2014-05-16 DIAGNOSIS — N529 Male erectile dysfunction, unspecified: Secondary | ICD-10-CM

## 2014-05-16 DIAGNOSIS — C61 Malignant neoplasm of prostate: Secondary | ICD-10-CM

## 2014-05-16 DIAGNOSIS — N393 Stress incontinence (female) (male): Secondary | ICD-10-CM | POA: Diagnosis not present

## 2014-05-16 DIAGNOSIS — R32 Unspecified urinary incontinence: Secondary | ICD-10-CM | POA: Diagnosis not present

## 2014-06-28 ENCOUNTER — Other Ambulatory Visit: Payer: Self-pay | Admitting: Family Medicine

## 2014-07-01 ENCOUNTER — Ambulatory Visit: Payer: Medicare Other | Admitting: Family Medicine

## 2014-07-01 ENCOUNTER — Encounter: Payer: Self-pay | Admitting: Family Medicine

## 2014-08-07 ENCOUNTER — Other Ambulatory Visit: Payer: Self-pay | Admitting: Family Medicine

## 2014-08-08 ENCOUNTER — Other Ambulatory Visit: Payer: Self-pay | Admitting: Family Medicine

## 2014-08-27 ENCOUNTER — Ambulatory Visit (INDEPENDENT_AMBULATORY_CARE_PROVIDER_SITE_OTHER): Payer: Medicare Other | Admitting: *Deleted

## 2014-08-27 DIAGNOSIS — Z23 Encounter for immunization: Secondary | ICD-10-CM

## 2014-11-11 DIAGNOSIS — Z8546 Personal history of malignant neoplasm of prostate: Secondary | ICD-10-CM | POA: Diagnosis not present

## 2014-11-14 ENCOUNTER — Ambulatory Visit (INDEPENDENT_AMBULATORY_CARE_PROVIDER_SITE_OTHER): Payer: Medicare Other | Admitting: Urology

## 2014-11-14 DIAGNOSIS — R32 Unspecified urinary incontinence: Secondary | ICD-10-CM

## 2014-11-14 DIAGNOSIS — Z8546 Personal history of malignant neoplasm of prostate: Secondary | ICD-10-CM

## 2014-11-14 DIAGNOSIS — N5231 Erectile dysfunction following radical prostatectomy: Secondary | ICD-10-CM | POA: Diagnosis not present

## 2014-12-16 ENCOUNTER — Other Ambulatory Visit: Payer: Self-pay | Admitting: Family Medicine

## 2015-01-26 ENCOUNTER — Other Ambulatory Visit: Payer: Self-pay | Admitting: Family Medicine

## 2015-02-05 ENCOUNTER — Other Ambulatory Visit: Payer: Self-pay | Admitting: *Deleted

## 2015-02-11 ENCOUNTER — Telehealth: Payer: Self-pay | Admitting: Family Medicine

## 2015-02-11 NOTE — Telephone Encounter (Signed)
Note is being sent to the patient recommending that he come in for a standard follow-up. It is been over 6 months.

## 2015-02-13 ENCOUNTER — Other Ambulatory Visit: Payer: Self-pay | Admitting: Family Medicine

## 2015-02-13 ENCOUNTER — Other Ambulatory Visit: Payer: Self-pay | Admitting: *Deleted

## 2015-02-13 MED ORDER — LISINOPRIL 10 MG PO TABS: 10.0000 mg | ORAL_TABLET | Freq: Every day | ORAL | Status: DC

## 2015-02-16 ENCOUNTER — Other Ambulatory Visit: Payer: Self-pay | Admitting: Family Medicine

## 2015-02-24 ENCOUNTER — Other Ambulatory Visit: Payer: Self-pay | Admitting: Family Medicine

## 2015-02-28 ENCOUNTER — Other Ambulatory Visit: Payer: Self-pay | Admitting: Family Medicine

## 2015-03-02 NOTE — Telephone Encounter (Signed)
Give 30 day plz send card he needs to sched and be seen

## 2015-03-06 ENCOUNTER — Other Ambulatory Visit: Payer: Self-pay | Admitting: Family Medicine

## 2015-03-19 ENCOUNTER — Ambulatory Visit (INDEPENDENT_AMBULATORY_CARE_PROVIDER_SITE_OTHER): Payer: Medicare Other | Admitting: Family Medicine

## 2015-03-19 ENCOUNTER — Encounter: Payer: Self-pay | Admitting: Family Medicine

## 2015-03-19 VITALS — BP 134/76 | Ht 68.5 in | Wt 172.0 lb

## 2015-03-19 DIAGNOSIS — E785 Hyperlipidemia, unspecified: Secondary | ICD-10-CM

## 2015-03-19 DIAGNOSIS — Z8546 Personal history of malignant neoplasm of prostate: Secondary | ICD-10-CM | POA: Insufficient documentation

## 2015-03-19 DIAGNOSIS — E119 Type 2 diabetes mellitus without complications: Secondary | ICD-10-CM | POA: Diagnosis not present

## 2015-03-19 DIAGNOSIS — M2010 Hallux valgus (acquired), unspecified foot: Secondary | ICD-10-CM | POA: Insufficient documentation

## 2015-03-19 DIAGNOSIS — N1832 Type 2 diabetes mellitus with diabetic chronic kidney disease: Secondary | ICD-10-CM | POA: Insufficient documentation

## 2015-03-19 DIAGNOSIS — I10 Essential (primary) hypertension: Secondary | ICD-10-CM

## 2015-03-19 DIAGNOSIS — M21619 Bunion of unspecified foot: Secondary | ICD-10-CM

## 2015-03-19 DIAGNOSIS — C61 Malignant neoplasm of prostate: Secondary | ICD-10-CM

## 2015-03-19 DIAGNOSIS — Z79899 Other long term (current) drug therapy: Secondary | ICD-10-CM | POA: Diagnosis not present

## 2015-03-19 MED ORDER — ATORVASTATIN CALCIUM 40 MG PO TABS: 40.0000 mg | ORAL_TABLET | Freq: Every day | ORAL | Status: DC

## 2015-03-19 MED ORDER — METFORMIN HCL 1000 MG PO TABS: ORAL_TABLET | ORAL | Status: DC

## 2015-03-19 MED ORDER — LISINOPRIL 10 MG PO TABS: 10.0000 mg | ORAL_TABLET | Freq: Every day | ORAL | Status: DC

## 2015-03-19 MED ORDER — GLIPIZIDE 5 MG PO TABS: ORAL_TABLET | ORAL | Status: DC

## 2015-03-19 NOTE — Progress Notes (Signed)
   Subjective:    Patient ID: Charles Medina, male    DOB: 03-27-1941, 74 y.o.   MRN: [id number]  Diabetes He presents for his follow-up diabetic visit. He has type 2 diabetes mellitus. Pertinent negatives for hypoglycemia include no confusion. Pertinent negatives for diabetes include no chest pain, no fatigue, no polydipsia, no polyphagia and no weakness. He participates in exercise three times a week. (89 - 125) He does not see a podiatrist.Eye exam is not current.  Wants rx for diabetic shoes. A1C 7.0 done on BW 12/12/14.   Pt states no concerns today.  Patient states he does try to eat healthy. He exercises several times a week he does take his cholesterol medicine blood pressure medicine. Denies any problems chest tightness pressure pain shortness of breath. States his energy level overall doing well. Patient denies any bone pain. Denies back pain. Denies rectal bleeding   Review of Systems  Constitutional: Negative for activity change, appetite change and fatigue.  HENT: Negative for congestion.   Respiratory: Negative for cough.   Cardiovascular: Negative for chest pain.  Gastrointestinal: Negative for abdominal pain.  Endocrine: Negative for polydipsia and polyphagia.  Neurological: Negative for weakness.  Psychiatric/Behavioral: Negative for confusion.       Objective:   Physical Exam  Constitutional: He appears well-nourished. No distress.  Cardiovascular: Normal rate, regular rhythm and normal heart sounds.   No murmur heard. Pulmonary/Chest: Effort normal and breath sounds normal. No respiratory distress.  Musculoskeletal: He exhibits no edema.  Lymphadenopathy:    He has no cervical adenopathy.  Neurological: He is alert.  Psychiatric: His behavior is normal.  Vitals reviewed.         Assessment & Plan:  1. Essential hypertension Blood pressure under good control when rechecked continue current measures check lab work - Basic metabolic panel  2.  Hyperlipidemia Continue cholesterol medicine. Check lab work in the next month - Lipid panel  3. Type 2 diabetes mellitus without complication Patient recent A1c looks good we will repeat this again - Hemoglobin A1c  4. Prostate cancer Has a history of prostate cancer check PSA had a prostatectomy in the past - PSA  5. High risk medication use Because of high risk medicine check liver profile - Hepatic function panel  6. History of prostate cancer See above  7. Hallux valgus with bunions, unspecified laterality Has bunions both sides along with pre-ulcerative calluses I do believe he would benefit from a diabetic shoes  Follow-up again approximate 6 months sooner problems

## 2015-03-25 ENCOUNTER — Telehealth: Payer: Self-pay | Admitting: Family Medicine

## 2015-03-25 NOTE — Telephone Encounter (Signed)
error 

## 2015-05-15 ENCOUNTER — Ambulatory Visit (INDEPENDENT_AMBULATORY_CARE_PROVIDER_SITE_OTHER): Payer: Medicare Other | Admitting: Urology

## 2015-05-15 DIAGNOSIS — Z8546 Personal history of malignant neoplasm of prostate: Secondary | ICD-10-CM

## 2015-05-15 DIAGNOSIS — N393 Stress incontinence (female) (male): Secondary | ICD-10-CM

## 2015-05-15 DIAGNOSIS — N5231 Erectile dysfunction following radical prostatectomy: Secondary | ICD-10-CM

## 2015-05-25 DIAGNOSIS — Z9079 Acquired absence of other genital organ(s): Secondary | ICD-10-CM | POA: Diagnosis not present

## 2015-05-25 DIAGNOSIS — E119 Type 2 diabetes mellitus without complications: Secondary | ICD-10-CM | POA: Diagnosis not present

## 2015-05-25 DIAGNOSIS — Z79899 Other long term (current) drug therapy: Secondary | ICD-10-CM | POA: Diagnosis not present

## 2015-05-25 DIAGNOSIS — C61 Malignant neoplasm of prostate: Secondary | ICD-10-CM | POA: Diagnosis not present

## 2015-05-25 DIAGNOSIS — I10 Essential (primary) hypertension: Secondary | ICD-10-CM | POA: Diagnosis not present

## 2015-05-25 DIAGNOSIS — E780 Pure hypercholesterolemia: Secondary | ICD-10-CM | POA: Diagnosis not present

## 2015-06-01 ENCOUNTER — Encounter: Payer: Self-pay | Admitting: Radiation Oncology

## 2015-06-01 NOTE — Progress Notes (Signed)
GU Location of Tumor / Histology: PSA recurrent prostate cancer  If Prostate Cancer, Gleason Score is (4 + 3=7) and PSA is (0.11 on 05/15/15)  Luan Pulling presented with a rising PSA after prostatectomy.  Biopsies revealed:  05/06/10 FINAL DIAGNOSIS Microscopic Examination and Diagnosis  1. PROSTATE, RADICAL RESECTION, : - PROSTATIC ADENOCARCINOMA, GLEASON'S SCORE 4+3=7 INVOLVING RIGHT AND LEFT LOBES. 2. LYMPH NODES, REGIONAL RESECTION, RIGHT PELVIC : - ONE BENIGN LYMPH NODE.NO TUMOR IDENTIFIED. 3. LYMPH NODES, REGIONAL RESECTION, LEFT PELVIC : - ONE BENIGN LYMPH NODE, NO TUMOR IDENTIFIED.  Past/Anticipated interventions by urology, if any: prostatectomy in 2011   Past/Anticipated interventions by medical oncology, if any: will return for follow up for 6 months with Dr. Jeffie Pollock.  Weight changes, if any: no  Bowel/Bladder complaints, if any: IPSS score of 2.  He reports nocturia and 2.  Denies bowel issues.   Nausea/Vomiting, if any: no  Pain issues, if any:  no  SAFETY ISSUES:  Prior radiation? no  Pacemaker/ICD? no  Possible current pregnancy? no  Is the patient on methotrexate? no  Current Complaints / other details:  Patient is her with his son and daughter.   BP 175/81 mmHg  Pulse 77  Temp(Src) 98.4 F (36.9 C) (Oral)  Resp 16  Ht 5' 8.5" (1.74 m)  Wt 166 lb 8 oz (75.524 kg)  BMI 24.95 kg/m2  SpO2 100%

## 2015-06-03 ENCOUNTER — Encounter: Payer: Self-pay | Admitting: Radiation Oncology

## 2015-06-03 ENCOUNTER — Telehealth: Payer: Self-pay | Admitting: *Deleted

## 2015-06-03 ENCOUNTER — Ambulatory Visit
Admission: RE | Admit: 2015-06-03 | Discharge: 2015-06-03 | Disposition: A | Discharge status: Home / Self Care | Payer: Medicare Other | Source: Ambulatory Visit | Attending: Radiation Oncology | Admitting: Radiation Oncology

## 2015-06-03 VITALS — BP 175/81 | HR 77 | Temp 98.4°F | Resp 16 | Ht 68.5 in | Wt 166.5 lb

## 2015-06-03 DIAGNOSIS — Z8546 Personal history of malignant neoplasm of prostate: Secondary | ICD-10-CM

## 2015-06-03 DIAGNOSIS — C61 Malignant neoplasm of prostate: Secondary | ICD-10-CM | POA: Diagnosis not present

## 2015-06-03 DIAGNOSIS — Z510 Encounter for antineoplastic radiation therapy: Secondary | ICD-10-CM | POA: Diagnosis not present

## 2015-06-03 HISTORY — DX: Malignant neoplasm of prostate: C61

## 2015-06-03 NOTE — Telephone Encounter (Signed)
Dr. Richardson Landry would like to see pt this week about test results. tcna to let pt know.

## 2015-06-03 NOTE — Progress Notes (Signed)
  Radiation Oncology         [phone] ________________________________  Documentation  Name: Charles Medina MRN: [id number]  Date: 06/03/2015  DOB: 01/09/1941  YH:OOILN Charles Phoenix, MD  Charles Drown, MD   REFERRING PHYSICIAN: Kathyrn Drown, MD   DIAGNOSIS: Charles Medina is a 74 year old male presenting to clinic in regards to his cancer of the prostate.   HISTORY OF PRESENT ILLNESS: Charles Medina is a 74 y.o. male who  was initially seen in consultation at the [facility] upon referral from Dr. Jeffie Pollock for rising PSA. Patient was felt to be a good candidate for salvage radiation therapy. Patient was scheduled for simulation today and to proceed with helical intensity modulated radiation therapy.  On the patient's planning CT scan of the abdomen and pelvis the patient was noted to have what appeared to be a significant abdominal aortic aneurysm. Patient appears to be completely asymptomatic concerning this issue. In light of this finding on the patient's planning CT scan, I called Dr. Lance Sell office and spoke to [employer]. The Patient will be set up for vascular surgery consultation in the near future with possible additional imaging planned. In light of this significant finding on the patient's CT scan his salvage radiation therapy is on hold at this time.      RADIOGRAPHY: From planning CT scan, radiation oncology Department       ______________________________  Blair Promise, PhD, MD

## 2015-06-03 NOTE — Progress Notes (Signed)
Please see the Nurse Progress Note in the MD Initial Consult Encounter for this patient. 

## 2015-06-04 ENCOUNTER — Encounter: Payer: Self-pay | Admitting: Family Medicine

## 2015-06-04 ENCOUNTER — Ambulatory Visit (INDEPENDENT_AMBULATORY_CARE_PROVIDER_SITE_OTHER): Payer: Medicare Other | Admitting: Family Medicine

## 2015-06-04 VITALS — BP 130/82 | Ht 68.5 in | Wt 162.0 lb

## 2015-06-04 DIAGNOSIS — I714 Abdominal aortic aneurysm, without rupture, unspecified: Secondary | ICD-10-CM

## 2015-06-04 MED ORDER — HYDROCODONE-ACETAMINOPHEN 5-325 MG PO TABS: 1.0000 | ORAL_TABLET | Freq: Two times a day (BID) | ORAL | Status: DC | PRN

## 2015-06-04 NOTE — Progress Notes (Signed)
   Subjective:    Patient ID: Charles Medina, male    DOB: 09-06-1941, 74 y.o.   MRN: [id number]  HPI Patient arrives to discuss recent test results. Patient was brought in for a discussion of recent incidental findings. He was seen the specialists in preparation for treatment for his prostate cancer. Evaluation at that time revealed the presence of an aortic aneurysm.   Patient reports no abdominal pain.  History remote smoking.  No close family history of aneurysm.  Review of Systems No headache no chest pain no shortness of breath no abdominal pain    Objective:   Physical Exam  Alert vitals stable. Lungs clear. Heart regular in rhythm. No CVA tenderness. Abdomen with deep palpation prominent pulsation noted.      Assessment & Plan:  Impression aortic aneurysm discussed at length plan ultrasound aorta. I advised patient with the size of this that he may well be facing surgery. Also warning signs discussed in terms of any sudden abdominal discomfort. Vascular surgeon referral. Gen. questions answered. WSL

## 2015-06-04 NOTE — Telephone Encounter (Signed)
o v scott Monday, we will disc pain meds then, make sure with scott, this is to discuss also recent aorytic aneurysm dx while being worked up for prostate intervention

## 2015-06-04 NOTE — Telephone Encounter (Signed)
Pt called back and wanted appt today. appt given.

## 2015-06-04 NOTE — Telephone Encounter (Signed)
Discussed with pt.pt is unable to come in this week. States he can come in Monday. Also pt requesting hydrocodone for shoulder pain he has had for years and pain that he has he has a BM. He states his stools are loose but since his surgery on his prostate 5 years ago he has pain off and on.

## 2015-06-08 ENCOUNTER — Ambulatory Visit (HOSPITAL_COMMUNITY)
Admission: RE | Admit: 2015-06-08 | Discharge: 2015-06-08 | Disposition: A | Discharge status: Home / Self Care | Payer: Medicare Other | Source: Ambulatory Visit | Attending: Family Medicine | Admitting: Family Medicine

## 2015-06-08 DIAGNOSIS — I714 Abdominal aortic aneurysm, without rupture: Secondary | ICD-10-CM | POA: Diagnosis not present

## 2015-06-11 ENCOUNTER — Encounter: Payer: Self-pay | Admitting: Vascular Surgery

## 2015-06-12 ENCOUNTER — Other Ambulatory Visit: Payer: Self-pay | Admitting: Vascular Surgery

## 2015-06-12 ENCOUNTER — Encounter: Payer: Self-pay | Admitting: Vascular Surgery

## 2015-06-12 ENCOUNTER — Encounter: Payer: Self-pay | Admitting: Cardiology

## 2015-06-12 ENCOUNTER — Ambulatory Visit (INDEPENDENT_AMBULATORY_CARE_PROVIDER_SITE_OTHER): Payer: Medicare Other | Admitting: Vascular Surgery

## 2015-06-12 VITALS — BP 188/80 | HR 77 | Temp 98.9°F | Resp 16 | Ht 71.0 in | Wt 162.0 lb

## 2015-06-12 DIAGNOSIS — I714 Abdominal aortic aneurysm, without rupture, unspecified: Secondary | ICD-10-CM | POA: Insufficient documentation

## 2015-06-12 DIAGNOSIS — Z01812 Encounter for preprocedural laboratory examination: Secondary | ICD-10-CM | POA: Diagnosis not present

## 2015-06-12 DIAGNOSIS — Z8546 Personal history of malignant neoplasm of prostate: Secondary | ICD-10-CM | POA: Diagnosis not present

## 2015-06-12 LAB — CREATININE, SERUM: CREATININE: 0.93 mg/dL (ref 0.70–1.18)

## 2015-06-12 NOTE — Addendum Note (Signed)
Addended by: Dorthula Rue L on: 06/12/2015 11:38 AM   Modules accepted: Orders

## 2015-06-12 NOTE — Addendum Note (Signed)
Addended by: Dorthula Rue L on: 06/12/2015 01:24 PM   Modules accepted: Orders

## 2015-06-12 NOTE — Progress Notes (Addendum)
Referred by: Charles Drown, MD [address] [place], [place] [address]  Reason for referral: AAA  History of Present Illness  The patient is a 74 y.o. ([birth year]) male who presents with chief complaint: aneurysm.  Patient is s/p prostectomy (?robotic).  On his subsequent abdominal CT, a large AAA was found.  This was confirmed with recent abd ultrasound: 6.1 cm x 6.7 cm.  The ultrasound also found a 2.6 cm x 2.8 cm R CIA aneurysm.  he patient does not have back or abdominal pain.  The patient notes no history of embolic episodes from the AAA.  The patient's risk factors for AAA included: age, male sex.  The patient does not smoke cigarettes.  Past Medical History  Diagnosis Date  . Hypertension   . Diabetes mellitus without complication   . Arthritis   . Prostate cancer     Past Surgical History  Procedure Laterality Date  . Prostatectomy  2011  . Colonoscopy  11/14/2012    Procedure: COLONOSCOPY;  Surgeon: Charles Houston, MD;  Location: [facility];  Service: Endoscopy;  Laterality: N/A;  830    History   Social History  . Marital Status: Married    Spouse Name: N/A  . Number of Children: 6  . Years of Education: N/A   Occupational History  . Not on file.   Social History Main Topics  . Smoking status: Former Smoker -- 0.50 packs/day    Quit date: 11/14/1978  . Smokeless tobacco: Never Used  . Alcohol Use: No  . Drug Use: No  . Sexual Activity: Yes   Other Topics Concern  . Not on file   Social History Narrative    Family History  Problem Relation Age of Onset  . Family history unknown: Yes    Current Outpatient Prescriptions  Medication Sig Dispense Refill  . aspirin 81 MG tablet Take 81 mg by mouth daily.    Marland Kitchen glipiZIDE (GLUCOTROL) 5 MG tablet TAKE 1 TABLET BY MOUTH TWICE DAILY BEFORE A MEAL. 60 tablet 5  . HYDROcodone-acetaminophen (NORCO/VICODIN) 5-325 MG per tablet Take 1 tablet by mouth 2 (two) times daily as needed. With food 42  tablet 0  . lisinopril (PRINIVIL,ZESTRIL) 10 MG tablet Take 1 tablet (10 mg total) by mouth daily. 15 tablet 5  . metFORMIN (GLUCOPHAGE) 1000 MG tablet TAKE (1) TABLET BY MOUTH TWICE DAILY. 180 tablet 5  . atorvastatin (LIPITOR) 40 MG tablet Take 1 tablet (40 mg total) by mouth daily. (Patient not taking: Reported on 06/12/2015) 30 tablet 5   No current facility-administered medications for this visit.     No Known Allergies  REVIEW OF SYSTEMS:  (Positives checked otherwise negative)  CARDIOVASCULAR:   [ ]  chest pain,  [ ]  chest pressure,  [ ]  palpitations,  [ ]  shortness of breath when laying flat,  [ ]  shortness of breath with exertion,   [x]  pain in feet when walking,  [x]  pain in feet when laying flat, [ ]  history of blood clot in veins (DVT),  [ ]  history of phlebitis,  [ ]  swelling in legs,  [ ]  varicose veins  PULMONARY:   [ ]  productive cough,  [ ]  asthma,  [ ]  wheezing  NEUROLOGIC:   [ ]  weakness in arms or legs,  [x]  numbness in arms or legs,  [ ]  difficulty speaking or slurred speech,  [ ]  temporary loss of vision in one eye,  [ ]  dizziness  HEMATOLOGIC:   [ ]  bleeding problems,  [ ]  problems with blood clotting too easily  MUSCULOSKEL:   [ ]  joint pain, [ ]  joint swelling  GASTROINTEST:   [ ]  vomiting blood,  [x]  painful stool: 5 yrs ago  GENITOURINARY:   [ ]  burning with urination,  [x]  painful urination: 5 yrs ago  PSYCHIATRIC:   [ ]  history of major depression  INTEGUMENTARY:   [ ]  rashes,  [ ]  ulcers  CONSTITUTIONAL:   [ ]  fever,  [ ]  chills    For VQI Use Only  PRE-ADM LIVING: Home  AMB STATUS: Ambulatory  CAD Sx: None  PRIOR CHF: None  STRESS TEST: [x]  No, [ ]  Normal, [ ]  + ischemia, [ ]  + MI, [ ]  Both   Physical Examination  Filed Vitals:   06/12/15 1027 06/12/15 1035  BP: 183/96 188/80  Pulse: 75 77  Temp: 98.9 F (37.2 C)   TempSrc: Oral   Resp: 16   Height: 5\' 11"  (1.803 m)   Weight: 162 lb (73.483 kg)    SpO2: 100%    Body mass index is 22.6 kg/(m^2).  General: A&O x 3, WDWN  Head: [place]/AT  Ear/Nose/Throat: Hearing grossly intact, nares w/o erythema or drainage, oropharynx w/o Erythema/Exudate  Eyes: PERRLA, EOMI  Neck: Supple, no nuchal rigidity, no palpable LAD  Pulmonary: Sym exp, good air movt, CTAB, no rales, rhonchi, & wheezing  Cardiac: RRR, Nl S1, S2, no Murmurs, rubs or gallops  Vascular: Vessel Right Left  Radial Palpable Palpable  Brachial Palpable Palpable  Carotid Palpable, without bruit Palpable, without bruit  Aorta Palpable AAA N/A  Femoral Palpable Palpable  Popliteal Not palpable Not palpable  PT Faintly Palpable Faintly Palpable  DP Palpable Palpable   Gastrointestinal: soft, NTND, no G/R, no HSM, no masses, no CVAT B, large AAA palpable in mid-line, small healed incisions consistent with laparoscopic/robotic procedure  Musculoskeletal: M/S 5/5 throughout , Extremities without ischemic changes   Neurologic: CN 2-12 intact , Pain and light touch intact in extremities , Motor exam as listed above  Psychiatric: Judgment intact, Mood & affect appropriate for pt's clinical situation  Dermatologic: See M/S exam for extremity exam, no rashes otherwise noted  Lymph : No Cervical, Axillary, or Inguinal lymphadenopathy    Non-Invasive Vascular Imaging  Outside AAA Duplex (Date: 06/08/15) Abdominal aorta measures at least 6.7 cm in diameter. The sonographer did obtain some images measuring the coronal diameter of the aorta at 7.2 cm although this may be an over estimation and slightly oblique. Vascular surgery consultation is recommended.   Outside Studies/Documentation 4 pages of outside documents were reviewed including: Heme/Onc progress note and outside AAA duplex.   Medical Decision Making  The patient is a 74 y.o. male who presents with: large AAA, small R CIA aneurysm   Based on this patient's exam and diagnostic studies, he needs CTA  abd/pelvis.  Pt meets criteria for repair.  Need CTA to determine if EVAR vs FEVER vs OAR candidate.  Will need to refer to Cardiology to expedite Anesthesia clearance.  The patient will follow up in 1 week with the CTA.  I emphasized the importance of maximal medical management including strict control of blood pressure, blood glucose, and lipid levels, antiplatelet agents, obtaining regular exercise, and cessation of smoking.    Thank you for allowing [place] to participate in this patient's care.   Adele Barthel, MD [facility] Office: [phone] Pager: [phone]  06/12/2015, 11:22  AM

## 2015-06-15 ENCOUNTER — Ambulatory Visit: Payer: Medicare Other | Admitting: Radiation Oncology

## 2015-06-15 ENCOUNTER — Ambulatory Visit
Admission: RE | Admit: 2015-06-15 | Discharge: 2015-06-15 | Disposition: A | Discharge status: Home / Self Care | Payer: Medicare Other | Source: Ambulatory Visit | Attending: Radiation Oncology | Admitting: Radiation Oncology

## 2015-06-15 ENCOUNTER — Encounter: Payer: Self-pay | Admitting: *Deleted

## 2015-06-16 ENCOUNTER — Ambulatory Visit
Admission: RE | Admit: 2015-06-16 | Discharge: 2015-06-16 | Disposition: A | Discharge status: Home / Self Care | Payer: Medicare Other | Source: Ambulatory Visit | Attending: Vascular Surgery | Admitting: Vascular Surgery

## 2015-06-16 ENCOUNTER — Ambulatory Visit: Payer: Medicare Other

## 2015-06-16 ENCOUNTER — Encounter: Payer: Self-pay | Admitting: *Deleted

## 2015-06-16 DIAGNOSIS — I714 Abdominal aortic aneurysm, without rupture, unspecified: Secondary | ICD-10-CM

## 2015-06-16 DIAGNOSIS — Z8546 Personal history of malignant neoplasm of prostate: Secondary | ICD-10-CM

## 2015-06-16 DIAGNOSIS — K5730 Diverticulosis of large intestine without perforation or abscess without bleeding: Secondary | ICD-10-CM | POA: Diagnosis not present

## 2015-06-16 MED ORDER — IOPAMIDOL (ISOVUE-370) INJECTION 76%
75.0000 mL | Freq: Once | INTRAVENOUS | Status: AC | PRN
  Administered 2015-06-16: 75 mL via INTRAVENOUS

## 2015-06-17 ENCOUNTER — Ambulatory Visit: Payer: Medicare Other

## 2015-06-18 ENCOUNTER — Ambulatory Visit: Payer: Medicare Other

## 2015-06-18 ENCOUNTER — Ambulatory Visit: Payer: Medicare Other | Admitting: Cardiology

## 2015-06-18 ENCOUNTER — Encounter: Payer: Self-pay | Admitting: Vascular Surgery

## 2015-06-18 ENCOUNTER — Ambulatory Visit (INDEPENDENT_AMBULATORY_CARE_PROVIDER_SITE_OTHER): Payer: Medicare Other | Admitting: Cardiology

## 2015-06-18 ENCOUNTER — Encounter: Payer: Self-pay | Admitting: *Deleted

## 2015-06-18 ENCOUNTER — Encounter: Payer: Self-pay | Admitting: Cardiology

## 2015-06-18 VITALS — BP 158/84 | HR 71 | Ht 70.0 in | Wt 161.0 lb

## 2015-06-18 DIAGNOSIS — Z01810 Encounter for preprocedural cardiovascular examination: Secondary | ICD-10-CM | POA: Diagnosis not present

## 2015-06-18 DIAGNOSIS — I10 Essential (primary) hypertension: Secondary | ICD-10-CM

## 2015-06-18 DIAGNOSIS — R0989 Other specified symptoms and signs involving the circulatory and respiratory systems: Secondary | ICD-10-CM

## 2015-06-18 NOTE — Addendum Note (Signed)
Addended by: Dorthula Rue L on: 06/18/2015 02:50 PM   Modules accepted: Orders

## 2015-06-18 NOTE — Progress Notes (Signed)
Patient ID: PHILO KURTZ, male   DOB: 1941-04-06, 74 y.o.   MRN: [id number]     Clinical Summary Mr. Rideaux is a 74 y.o.male seen today as a new patient for the following medical problems.  1. Preoperative cardiac evaluation - patient being considered for AAA surgery for 6.1 x 6.7 aneurysm - no cardiac history. Denies any chest pain. Denies any SOB or DOE.  - goes to gym regularly, can go up to 30 minutes on treadmill at fast walking pace. Walks up flight of stairs without troubles on a regular basis.    Past Medical History  Diagnosis Date  . Hypertension   . Diabetes mellitus without complication   . Arthritis   . Prostate cancer      No Known Allergies   Current Outpatient Prescriptions  Medication Sig Dispense Refill  . aspirin 81 MG tablet Take 81 mg by mouth daily.    Marland Kitchen atorvastatin (LIPITOR) 40 MG tablet Take 1 tablet (40 mg total) by mouth daily. (Patient not taking: Reported on 06/12/2015) 30 tablet 5  . glipiZIDE (GLUCOTROL) 5 MG tablet TAKE 1 TABLET BY MOUTH TWICE DAILY BEFORE A MEAL. 60 tablet 5  . HYDROcodone-acetaminophen (NORCO/VICODIN) 5-325 MG per tablet Take 1 tablet by mouth 2 (two) times daily as needed. With food 42 tablet 0  . lisinopril (PRINIVIL,ZESTRIL) 10 MG tablet Take 1 tablet (10 mg total) by mouth daily. 15 tablet 5  . metFORMIN (GLUCOPHAGE) 1000 MG tablet TAKE (1) TABLET BY MOUTH TWICE DAILY. 180 tablet 5   No current facility-administered medications for this visit.     Past Surgical History  Procedure Laterality Date  . Prostatectomy  2011  . Colonoscopy  11/14/2012    Procedure: COLONOSCOPY;  Surgeon: Rogene Houston, MD;  Location: [facility];  Service: Endoscopy;  Laterality: N/A;  830     No Known Allergies    Family History  Problem Relation Age of Onset  . Family history unknown: Yes     Social History Mr. Chamberland reports that he quit smoking about 36 years ago. He has never used smokeless tobacco. Mr. Moates reports that he does  not drink alcohol.   Review of Systems CONSTITUTIONAL: No weight loss, fever, chills, weakness or fatigue.  HEENT: Eyes: No visual loss, blurred vision, double vision or yellow sclerae.No hearing loss, sneezing, congestion, runny nose or sore throat.  SKIN: No rash or itching.  CARDIOVASCULAR: per HPI RESPIRATORY: No shortness of breath, cough or sputum.  GASTROINTESTINAL: No anorexia, nausea, vomiting or diarrhea. No abdominal pain or blood.  GENITOURINARY: No burning on urination, no polyuria NEUROLOGICAL: No headache, dizziness, syncope, paralysis, ataxia, numbness or tingling in the extremities. No change in bowel or bladder control.  MUSCULOSKELETAL: No muscle, back pain, joint pain or stiffness.  LYMPHATICS: No enlarged nodes. No history of splenectomy.  PSYCHIATRIC: No history of depression or anxiety.  ENDOCRINOLOGIC: No reports of sweating, cold or heat intolerance. No polyuria or polydipsia.  Marland Kitchen   Physical Examination Filed Vitals:   06/18/15 1041  BP: 158/84  Pulse: 71   Filed Vitals:   06/18/15 1041  Height: 5\' 10"  (1.778 m)  Weight: 161 lb (73.029 kg)   Manual bp 140/90 Gen: resting comfortably, no acute distress HEENT: no scleral icterus, pupils equal round and reactive, no palptable cervical adenopathy,  CV: RRR, no m/r/g, no JVD. Bilateral carotid bruits Resp: Clear to auscultation bilaterally GI: abdomen is soft, non-tender, non-distended, normal bowel sounds, no hepatosplenomegaly MSK: extremities  are warm, no edema.  Skin: warm, no rash Neuro:  no focal deficits Psych: appropriate affect     Assessment and Plan  1. Preoperative cardiac evaluation - patient being considered for AAA repair - he has not active acute cardiac conditions - he tolerates greater than 4 METs regularly without significant limitation - recommend proceeding with procedure as planned  2. HTN - at goal, continue current meds    F/u 3 months Arnoldo Lenis, M.D.

## 2015-06-18 NOTE — Patient Instructions (Signed)
Your physician wants you to follow-up in: 6 months with Dr. Bryna Colander will receive a reminder letter in the mail two months in advance. If you don't receive a letter, please call our office to schedule the follow-up appointment.  Your physician recommends that you continue on your current medications as directed. Please refer to the Current Medication list given to you today.  Your physician has requested that you have a carotid duplex. This test is an ultrasound of the carotid arteries in your neck. It looks at blood flow through these arteries that supply the brain with blood. Allow one hour for this exam. There are no restrictions or special instructions.  Thank you for choosing [facility]!!

## 2015-06-19 ENCOUNTER — Ambulatory Visit: Payer: Medicare Other

## 2015-06-19 ENCOUNTER — Encounter: Payer: Self-pay | Admitting: Vascular Surgery

## 2015-06-19 ENCOUNTER — Other Ambulatory Visit: Payer: Self-pay

## 2015-06-19 ENCOUNTER — Ambulatory Visit (INDEPENDENT_AMBULATORY_CARE_PROVIDER_SITE_OTHER): Payer: Medicare Other | Admitting: Vascular Surgery

## 2015-06-19 ENCOUNTER — Telehealth: Payer: Self-pay | Admitting: *Deleted

## 2015-06-19 VITALS — BP 168/81 | HR 72 | Temp 98.5°F | Ht 70.0 in | Wt 162.5 lb

## 2015-06-19 DIAGNOSIS — I714 Abdominal aortic aneurysm, without rupture, unspecified: Secondary | ICD-10-CM

## 2015-06-19 NOTE — Telephone Encounter (Signed)
VVS Dr. Daron Offer office would like this complete by Monday, they are aware Dr. Harl Bowie was 1/2 day today.

## 2015-06-19 NOTE — Progress Notes (Signed)
Established Abdominal Aortic Aneurysm  History of Present Illness  The patient is a 74 y.o. ([birth year]) male who presents with chief complaint: follow up for AAA.  Pt recently had CTA abd/pelvis which identified a 6.7 cm AAA compatible with EVAR.  The patient does no have back or abdominal pain.  The patient is not a smoker.  Past Medical History  Diagnosis Date  . Hypertension   . Diabetes mellitus without complication   . Arthritis   . Prostate cancer     Past Surgical History  Procedure Laterality Date  . Prostatectomy  2011  . Colonoscopy  11/14/2012    Procedure: COLONOSCOPY;  Surgeon: Rogene Houston, MD;  Location: [facility];  Service: Endoscopy;  Laterality: N/A;  830    Social History   Social History  . Marital Status: Widowed    Spouse Name: N/A  . Number of Children: 6  . Years of Education: N/A   Occupational History  . Not on file.   Social History Main Topics  . Smoking status: Former Smoker -- 0.50 packs/day for 23 years    Types: Cigarettes    Start date: 05/08/1956    Quit date: 11/14/1978  . Smokeless tobacco: Never Used  . Alcohol Use: No  . Drug Use: No  . Sexual Activity: Yes   Other Topics Concern  . Not on file   Social History Narrative    Family History  Problem Relation Age of Onset  . Family history unknown: Yes     Current Outpatient Prescriptions  Medication Sig Dispense Refill  . aspirin 81 MG tablet Take 81 mg by mouth daily.    Marland Kitchen atorvastatin (LIPITOR) 40 MG tablet Take 1 tablet (40 mg total) by mouth daily. 30 tablet 5  . glipiZIDE (GLUCOTROL) 5 MG tablet TAKE 1 TABLET BY MOUTH TWICE DAILY BEFORE A MEAL. 60 tablet 5  . HYDROcodone-acetaminophen (NORCO/VICODIN) 5-325 MG per tablet Take 1 tablet by mouth 2 (two) times daily as needed. With food 42 tablet 0  . lisinopril (PRINIVIL,ZESTRIL) 10 MG tablet Take 1 tablet (10 mg total) by mouth daily. 15 tablet 5  . metFORMIN (GLUCOPHAGE) 1000 MG tablet TAKE (1) TABLET BY  MOUTH TWICE DAILY. 180 tablet 5   No current facility-administered medications for this visit.     No Known Allergies   REVIEW OF SYSTEMS: (Positives checked otherwise negative)  CARDIOVASCULAR:  [ ]  chest pain,  [ ]  chest pressure,  [ ]  palpitations,  [ ]  shortness of breath when laying flat,  [ ]  shortness of breath with exertion,  [x]  pain in feet when walking,  [x]  pain in feet when laying flat, [ ]  history of blood clot in veins (DVT),  [ ]  history of phlebitis,  [ ]  swelling in legs,  [ ]  varicose veins  PULMONARY:  [ ]  productive cough,  [ ]  asthma,  [ ]  wheezing  NEUROLOGIC:  [ ]  weakness in arms or legs,  [x]  numbness in arms or legs,  [ ]  difficulty speaking or slurred speech,  [ ]  temporary loss of vision in one eye,  [ ]  dizziness  HEMATOLOGIC:  [ ]  bleeding problems,  [ ]  problems with blood clotting too easily  MUSCULOSKEL:  [ ]  joint pain, [ ]  joint swelling  GASTROINTEST:  [ ]  vomiting blood,  [x]  painful stool: 5 yrs ago  GENITOURINARY:  [ ]  burning with urination,  [x]  painful urination: 5 yrs ago  PSYCHIATRIC:  [ ]   history of major depression  INTEGUMENTARY:  [ ]  rashes,  [ ]  ulcers  CONSTITUTIONAL:  [ ]  fever,  [ ]  chills    For VQI Use Only  PRE-ADM LIVING: Home  AMB STATUS: Ambulatory  CAD Sx: None  PRIOR CHF: None  STRESS TEST: [x]  No, [ ]  Normal, [ ]  + ischemia, [ ]  + MI, [ ]  Both   Physical Examination  Filed Vitals:   06/19/15 0831 06/19/15 0833  BP: 161/89 168/81  Pulse: 72   Temp: 98.5 F (36.9 C)   TempSrc: Oral   Height: 5\' 10"  (1.778 m)   Weight: 162 lb 8 oz (73.71 kg)   SpO2: 100%    Body mass index is 23.32 kg/(m^2).  General: A&O x 3, WDWN  Pulmonary: Sym exp, good air movt, CTAB, no rales, rhonchi, & wheezing  Cardiac: RRR, Nl S1, S2, no Murmurs, rubs or gallops  Vascular: Vessel Right Left  Radial Palpable Palpable  Brachial Palpable  Palpable  Carotid Palpable, with bruit Palpable, without bruit  Aorta Not palpable N/A  Femoral Palpable Palpable  Popliteal Not palpable Not palpable  PT Faintly Palpable Faintly Palpable  DP Palpable Palpable   Gastrointestinal: soft, NTND, no G/R, no HSM, no masses, no CVAT B  Musculoskeletal: M/S 5/5 throughout , Extremities without ischemic changes   Neurologic:  Pain and light touch intact in extremities , Motor exam as listed above  CTA Abd/Pelvis (06/16/15) Vascular Impression:  1. Large infrarenal abdominal aortic aneurysm measuring approximately 6.7 cm in maximal diameter extends to asymmetrically involve the origin and proximal aspect of the right common iliac artery. There is a large amount of mixed calcified and largely noncalcified irregular mural thrombus within the dominant aneurysmal component without evidence of abdominal aortic dissection or periaortic stranding. 2. Suspected hemodynamically significant stenoses involving the origin of the celiac and left common iliac arteries. Nonvascular Impression:  1. Colonic diverticulosis without evidence of diverticulitis.  Based on my review of this patient's CTA, he has a large infrarenal AAA with adequate neck and access vessels for an EVAR   Medical Decision Making  The patient is a 74 y.o. male who presents with: asymptomatic large AAA    Based on this patient's exam and diagnostic studies, he needs EVAR. The patient is aware the risks of endovascular aortic surgery include but are not limited to: bleeding, need for transfusion, infection, death, stroke, paralysis, wound complications, bowel ischemia, extended ventilation, anaphylactic reaction to contrast, contrast induced nephropathy, embolism, and need for additional procedure to address endoleaks.   Overall, I cited a mortality rate of 1-2% and morbidity rate of 15%.  Tenatively, he will be scheduled for EVAR on 18 AUG 16 vs 25 AUG 16.  Patient has no  chest pain and is able to to climb >2 flights of stairs daily without chest pain, so he should be able to tolerate the procedure.  Will get a clearance letter from Cardiology if they agree.  I emphasized the importance of maximal medical management including strict control of blood pressure, blood glucose, and lipid levels, antiplatelet agents, obtaining regular exercise, and cessation of smoking.    Thank you for allowing [place] to participate in this patient's care.   Adele Barthel, MD [facility] Office: [phone] Pager: [phone]  06/19/2015, 9:12 AM

## 2015-06-19 NOTE — Telephone Encounter (Signed)
VVS Dr. Lurline Hare would like surgical clearance for upcoming endovascular repair of aneurysm scheduled for 8/18 and Carotid US is scheduled for 8/15. Will forward to Dr. Harl Bowie

## 2015-06-19 NOTE — Telephone Encounter (Signed)
Will complete 

## 2015-06-22 ENCOUNTER — Inpatient Hospital Stay (HOSPITAL_COMMUNITY): Admission: RE | Admit: 2015-06-22 | Payer: Medicare Other | Source: Ambulatory Visit

## 2015-06-22 ENCOUNTER — Ambulatory Visit: Payer: Medicare Other

## 2015-06-23 ENCOUNTER — Encounter (HOSPITAL_COMMUNITY): Payer: Self-pay

## 2015-06-23 ENCOUNTER — Encounter (HOSPITAL_COMMUNITY)
Admission: RE | Admit: 2015-06-23 | Discharge: 2015-06-23 | Disposition: A | Discharge status: Home / Self Care | Payer: Medicare Other | Source: Ambulatory Visit | Attending: Vascular Surgery | Admitting: Vascular Surgery

## 2015-06-23 ENCOUNTER — Ambulatory Visit: Payer: Medicare Other

## 2015-06-23 ENCOUNTER — Encounter: Payer: Self-pay | Admitting: Vascular Surgery

## 2015-06-23 HISTORY — DX: Abdominal aortic aneurysm, without rupture: I71.4

## 2015-06-23 HISTORY — DX: Abdominal aortic aneurysm, without rupture, unspecified: I71.40

## 2015-06-23 HISTORY — DX: Type 2 diabetes mellitus without complications: E11.9

## 2015-06-23 LAB — COMPREHENSIVE METABOLIC PANEL
ALBUMIN: 3.8 g/dL (ref 3.5–5.0)
ALT: 27 U/L (ref 17–63)
AST: 26 U/L (ref 15–41)
Alkaline Phosphatase: 55 U/L (ref 38–126)
Anion gap: 11 (ref 5–15)
BUN: 13 mg/dL (ref 6–20)
CHLORIDE: 107 mmol/L (ref 101–111)
CO2: 22 mmol/L (ref 22–32)
Calcium: 9.5 mg/dL (ref 8.9–10.3)
Creatinine, Ser: 0.91 mg/dL (ref 0.61–1.24)
GFR calc Af Amer: >60 mL/min (ref >60)
GFR calc non Af Amer: >60 mL/min (ref >60)
GLUCOSE: 135 mg/dL — AB (ref 65–99)
POTASSIUM: 4.4 mmol/L (ref 3.5–5.1)
Sodium: 140 mmol/L (ref 135–145)
Total Bilirubin: 0.5 mg/dL (ref 0.3–1.2)
Total Protein: 6.6 g/dL (ref 6.5–8.1)

## 2015-06-23 LAB — BLOOD GAS, ARTERIAL
Acid-base deficit: 0 mmol/L (ref 0.0–2.0)
BICARBONATE: 24.2 meq/L — AB (ref 20.0–24.0)
Drawn by: 206361
FIO2: 0.21
O2 Saturation: 97.4 %
PATIENT TEMPERATURE: 98.6
PCO2 ART: 39.7 mmHg (ref 35.0–45.0)
PH ART: 7.401 (ref 7.350–7.450)
TCO2: 25.4 mmol/L (ref 0–100)
pO2, Arterial: 96.2 mmHg (ref 80.0–100.0)

## 2015-06-23 LAB — SURGICAL PCR SCREEN
MRSA, PCR: NEGATIVE
STAPHYLOCOCCUS AUREUS: NEGATIVE

## 2015-06-23 LAB — TYPE AND SCREEN
ABO/RH(D): B POS
Antibody Screen: NEGATIVE

## 2015-06-23 LAB — URINALYSIS, ROUTINE W REFLEX MICROSCOPIC
GLUCOSE, UA: NEGATIVE mg/dL
HGB URINE DIPSTICK: NEGATIVE
KETONES UR: 15 mg/dL — AB
Leukocytes, UA: NEGATIVE
Nitrite: NEGATIVE
PROTEIN: NEGATIVE mg/dL
Specific Gravity, Urine: 1.03 (ref 1.005–1.030)
Urobilinogen, UA: 1 mg/dL (ref 0.0–1.0)
pH: 5 (ref 5.0–8.0)

## 2015-06-23 LAB — CBC
HEMATOCRIT: 40.5 % (ref 39.0–52.0)
Hemoglobin: 13.4 g/dL (ref 13.0–17.0)
MCH: 31.2 pg (ref 26.0–34.0)
MCHC: 33.1 g/dL (ref 30.0–36.0)
MCV: 94.4 fL (ref 78.0–100.0)
PLATELETS: 154 10*3/uL (ref 150–400)
RBC: 4.29 MIL/uL (ref 4.22–5.81)
RDW: 15 % (ref 11.5–15.5)
WBC: 7.3 10*3/uL (ref 4.0–10.5)

## 2015-06-23 LAB — APTT: aPTT: 27 seconds (ref 24–37)

## 2015-06-23 LAB — PROTIME-INR
INR: 1.17 (ref 0.00–1.49)
Prothrombin Time: 15.1 seconds (ref 11.6–15.2)

## 2015-06-23 LAB — ABO/RH: ABO/RH(D): B POS

## 2015-06-23 LAB — GLUCOSE, CAPILLARY: Glucose-Capillary: 137 mg/dL — ABNORMAL HIGH (ref 65–99)

## 2015-06-23 NOTE — Progress Notes (Signed)
Patient denies chest pain/ shob. Cardiac clearance by Dr. Harl Bowie on chart. Dr. Wolfgang Phoenix, Nicki Reaper in [place] is PCP

## 2015-06-23 NOTE — Pre-Procedure Instructions (Signed)
CAIUS SILBERNAGEL  06/23/2015      Your procedure is scheduled on August 18.  Report to [facility] Admitting at 5:30 A.M.  Call this number if you have problems the morning of surgery:  [phone]   Remember:  Do not eat food or drink liquids after midnight.  Take these medicines the morning of surgery with A SIP OF WATER None   STOP/ Do not take  Aleve, Naproxen, Advil, Ibuprofen, Motrin, Vitamins, Herbs, or Supplements starting today  How to Manage Your Diabetes Before Surgery   Why is it important to control my blood sugar before and after surgery?   Improving blood sugar levels before and after surgery helps healing and can limit problems.  A way of improving blood sugar control is eating a healthy diet by:  - Eating less sugar and carbohydrates  - Increasing activity/exercise  - Talk with your doctor about reaching your blood sugar goals  High blood sugars (greater than 180 mg/dL) can raise your risk of infections and slow down your recovery so you will need to focus on controlling your diabetes during the weeks before surgery.  Make sure that the doctor who takes care of your diabetes knows about your planned surgery including the date and location.  How do I manage my blood sugars before surgery?   Check your blood sugar at least 4 times a day, 2 days before surgery to make sure that they are not too high or low.   Check your blood sugar the morning of your surgery when you wake up and every 2 hours until you get to the Short-Stay unit.  If your blood sugar is less than 70 mg/dL, you will need to treat for low blood sugar by:  Treat a low blood sugar (less than 70 mg/dL) with 1/2 cup of clear juice (cranberry or apple), 4 glucose tablets, OR glucose gel.  Recheck blood sugar in 15 minutes after treatment (to make sure it is greater than 70 mg/dL).  If blood sugar is not greater than 70 mg/dL on re-check, call [phone] for further  instructions.   Report your blood sugar to the Short-Stay nurse when you get to Short-Stay.  References:  [facility], 2007 "How to Manage your Diabetes Before and After Surgery".  What do I do about my diabetes medications?   Do not take oral diabetes medicines (pills) the morning of surgery.   Do not wear jewelry, make-up or nail polish.  Do not wear lotions, powders, or perfumes.  You may wear deodorant.  Do not shave 48 hours prior to surgery.  Men may shave face and neck.  Do not bring valuables to the hospital.  [facility] is not responsible for any belongings or valuables.  Contacts, dentures or bridgework may not be worn into surgery.  Leave your suitcase in the car.  After surgery it may be brought to your room.  For patients admitted to the hospital, discharge time will be determined by your treatment team.  Patients discharged the day of surgery will not be allowed to drive home.   [place] - Preparing for Surgery  Before surgery, you can play an important role.  Because skin is not sterile, your skin needs to be as free of germs as possible.  You can reduce the number of germs on you skin by washing with CHG (chlorahexidine gluconate) soap before surgery.  CHG is an antiseptic cleaner which kills germs and  bonds with the skin to continue killing germs even after washing.  Please DO NOT use if you have an allergy to CHG or antibacterial soaps.  If your skin becomes reddened/irritated stop using the CHG and inform your nurse when you arrive at Short Stay.  Do not shave (including legs and underarms) for at least 48 hours prior to the first CHG shower.  You may shave your face.  Please follow these instructions carefully:   1.  Shower with CHG Soap the night before surgery and the morning of Surgery.  2.  If you choose to wash your hair, wash your hair first as usual with your normal shampoo.  3.  After you shampoo, rinse your hair and body  thoroughly to remove the shampoo.  4.  Use CHG as you would any other liquid soap.  You can apply CHG directly to the skin and wash gently with scrungie or a clean washcloth.  5.  Apply the CHG Soap to your body ONLY FROM THE NECK DOWN.  Do not use on open wounds or open sores.  Avoid contact with your eyes, ears, mouth and genitals (private parts).  Wash genitals (private parts) with your normal soap.  6.  Wash thoroughly, paying special attention to the area where your surgery will be performed.  7.  Thoroughly rinse your body with warm water from the neck down.  8.  DO NOT shower/wash with your normal soap after using and rinsing off the CHG Soap.  9.  Pat yourself dry with a clean towel.            10.  Wear clean pajamas.            11.  Place clean sheets on your bed the night of your first shower and do not sleep with pets.  Day of Surgery  Do not apply any lotions the morning of surgery.  Please wear clean clothes to the hospital/surgery center.    Please read over the following fact sheets that you were given. Pain Booklet, Coughing and Deep Breathing, Blood Transfusion Information and Surgical Site Infection Prevention

## 2015-06-24 ENCOUNTER — Other Ambulatory Visit: Payer: Self-pay | Admitting: Vascular Surgery

## 2015-06-24 ENCOUNTER — Encounter: Payer: Self-pay | Admitting: Vascular Surgery

## 2015-06-24 ENCOUNTER — Ambulatory Visit: Payer: Medicare Other

## 2015-06-24 ENCOUNTER — Other Ambulatory Visit: Payer: Self-pay

## 2015-06-24 ENCOUNTER — Ambulatory Visit (INDEPENDENT_AMBULATORY_CARE_PROVIDER_SITE_OTHER)
Admission: RE | Admit: 2015-06-24 | Discharge: 2015-06-24 | Disposition: A | Discharge status: Home / Self Care | Payer: Medicare Other | Source: Ambulatory Visit | Attending: Vascular Surgery | Admitting: Vascular Surgery

## 2015-06-24 ENCOUNTER — Other Ambulatory Visit: Payer: Self-pay | Admitting: Cardiology

## 2015-06-24 DIAGNOSIS — R0989 Other specified symptoms and signs involving the circulatory and respiratory systems: Secondary | ICD-10-CM | POA: Diagnosis not present

## 2015-06-24 DIAGNOSIS — Z01810 Encounter for preprocedural cardiovascular examination: Secondary | ICD-10-CM

## 2015-06-24 LAB — HEMOGLOBIN A1C
HEMOGLOBIN A1C: 6.6 % — AB (ref 4.8–5.6)
MEAN PLASMA GLUCOSE: 143 mg/dL

## 2015-06-24 MED ORDER — SODIUM CHLORIDE 0.9 % IV SOLN: INTRAVENOUS | Status: DC

## 2015-06-24 MED ORDER — DEXTROSE 5 % IV SOLN
1.5000 g | INTRAVENOUS | Status: AC
  Administered 2015-06-25: 1.5 g via INTRAVENOUS
  Filled 2015-06-24: qty 1.5

## 2015-06-24 MED ORDER — CHLORHEXIDINE GLUCONATE CLOTH 2 % EX PADS: 6.0000 | MEDICATED_PAD | Freq: Once | CUTANEOUS | Status: DC

## 2015-06-24 NOTE — Progress Notes (Signed)
Anesthesia Chart Review: Patient is a 74 year old male scheduled for AAA EVAR on 06/25/15 by Dr. Bridgett Larsson.    History includes 6.7 cm AAA, former smoker, HTN, DM2, prostate cancer s/p prostatectomy '11 with evidence of recurrence 11/2014 (radiation therapy is on hold until after EVAR). PCP is Dr. Sallee Lange. Urologist is Dr. Jeffie Pollock. He was evaluated by cardiologist Dr. Carlyle Dolly and was cleared for surgery as patient was asymptomatic from a CV standpoint and could tolerate activity > 4 METS.  06/18/15 EKG: SR, old anteroseptal infarct, non-specific T wave abnormality.  He is scheduled for carotid duplex at 3:30 PM today. Results still pending at 5 PM, so Dr. Bridgett Larsson and his anesthesiologist can follow-up tomorrow.    Preoperative labs noted.   Patient was cleared by cardiology without additional cardiac testing. If no acute changes then I would anticipate that he could proceed as planned.  George Hugh [facility] Short Stay Center/Anesthesiology Phone [phone] 06/24/2015 5:00 PM

## 2015-06-25 ENCOUNTER — Inpatient Hospital Stay (HOSPITAL_COMMUNITY)
Admission: RE | Admit: 2015-06-25 | Discharge: 2015-06-26 | DRG: 269 | Disposition: A | Discharge status: Home / Self Care | Payer: Medicare Other | Source: Ambulatory Visit | Attending: Vascular Surgery | Admitting: Vascular Surgery

## 2015-06-25 ENCOUNTER — Ambulatory Visit: Payer: Medicare Other

## 2015-06-25 ENCOUNTER — Encounter (HOSPITAL_COMMUNITY)
Admission: RE | Disposition: A | Discharge status: Home / Self Care | Payer: Self-pay | Source: Ambulatory Visit | Attending: Vascular Surgery

## 2015-06-25 ENCOUNTER — Encounter: Payer: Medicare Other | Admitting: Vascular Surgery

## 2015-06-25 ENCOUNTER — Encounter (HOSPITAL_COMMUNITY): Payer: Self-pay | Admitting: *Deleted

## 2015-06-25 ENCOUNTER — Inpatient Hospital Stay (HOSPITAL_COMMUNITY): Payer: Medicare Other

## 2015-06-25 ENCOUNTER — Inpatient Hospital Stay (HOSPITAL_COMMUNITY): Payer: Medicare Other | Admitting: Certified Registered Nurse Anesthetist

## 2015-06-25 ENCOUNTER — Inpatient Hospital Stay (HOSPITAL_COMMUNITY): Payer: Medicare Other | Admitting: Vascular Surgery

## 2015-06-25 DIAGNOSIS — I723 Aneurysm of iliac artery: Secondary | ICD-10-CM | POA: Diagnosis present

## 2015-06-25 DIAGNOSIS — Z7982 Long term (current) use of aspirin: Secondary | ICD-10-CM

## 2015-06-25 DIAGNOSIS — I714 Abdominal aortic aneurysm, without rupture, unspecified: Secondary | ICD-10-CM | POA: Diagnosis present

## 2015-06-25 DIAGNOSIS — Z87891 Personal history of nicotine dependence: Secondary | ICD-10-CM

## 2015-06-25 DIAGNOSIS — I10 Essential (primary) hypertension: Secondary | ICD-10-CM | POA: Diagnosis present

## 2015-06-25 DIAGNOSIS — Z79899 Other long term (current) drug therapy: Secondary | ICD-10-CM

## 2015-06-25 DIAGNOSIS — E119 Type 2 diabetes mellitus without complications: Secondary | ICD-10-CM | POA: Diagnosis present

## 2015-06-25 DIAGNOSIS — Z8546 Personal history of malignant neoplasm of prostate: Secondary | ICD-10-CM

## 2015-06-25 DIAGNOSIS — Z48812 Encounter for surgical aftercare following surgery on the circulatory system: Secondary | ICD-10-CM | POA: Diagnosis not present

## 2015-06-25 DIAGNOSIS — Z9889 Other specified postprocedural states: Secondary | ICD-10-CM

## 2015-06-25 DIAGNOSIS — Z4889 Encounter for other specified surgical aftercare: Secondary | ICD-10-CM | POA: Diagnosis not present

## 2015-06-25 HISTORY — PX: ABDOMINAL AORTIC ENDOVASCULAR STENT GRAFT: SHX5707

## 2015-06-25 LAB — CREATININE, SERUM
CREATININE: 1.43 mg/dL — AB (ref 0.61–1.24)
GFR, EST AFRICAN AMERICAN: 53 mL/min — AB (ref >60)
GFR, EST NON AFRICAN AMERICAN: 46 mL/min — AB (ref >60)

## 2015-06-25 LAB — GLUCOSE, CAPILLARY
GLUCOSE-CAPILLARY: 114 mg/dL — AB (ref 65–99)
GLUCOSE-CAPILLARY: 169 mg/dL — AB (ref 65–99)
GLUCOSE-CAPILLARY: 145 mg/dL — AB (ref 65–99)
GLUCOSE-CAPILLARY: 119 mg/dL — AB (ref 65–99)
Glucose-Capillary: 205 mg/dL — ABNORMAL HIGH (ref 65–99)

## 2015-06-25 LAB — CBC
HCT: 22.1 % — ABNORMAL LOW (ref 39.0–52.0)
HCT: 35 % — ABNORMAL LOW (ref 39.0–52.0)
HEMOGLOBIN: 7.4 g/dL — AB (ref 13.0–17.0)
Hemoglobin: 11.5 g/dL — ABNORMAL LOW (ref 13.0–17.0)
MCH: 31.6 pg (ref 26.0–34.0)
MCH: 31.2 pg (ref 26.0–34.0)
MCHC: 33.5 g/dL (ref 30.0–36.0)
MCHC: 32.9 g/dL (ref 30.0–36.0)
MCV: 94.4 fL (ref 78.0–100.0)
MCV: 94.9 fL (ref 78.0–100.0)
PLATELETS: 103 10*3/uL — AB (ref 150–400)
Platelets: 93 10*3/uL — ABNORMAL LOW (ref 150–400)
RBC: 2.34 MIL/uL — AB (ref 4.22–5.81)
RBC: 3.69 MIL/uL — ABNORMAL LOW (ref 4.22–5.81)
RDW: 15 % (ref 11.5–15.5)
RDW: 15.1 % (ref 11.5–15.5)
WBC: 6.8 10*3/uL (ref 4.0–10.5)
WBC: 7 10*3/uL (ref 4.0–10.5)

## 2015-06-25 LAB — MAGNESIUM: MAGNESIUM: 1.5 mg/dL — AB (ref 1.7–2.4)

## 2015-06-25 LAB — BASIC METABOLIC PANEL
Anion gap: 6 (ref 5–15)
BUN: 13 mg/dL (ref 6–20)
CHLORIDE: 109 mmol/L (ref 101–111)
CO2: 22 mmol/L (ref 22–32)
Calcium: 8 mg/dL — ABNORMAL LOW (ref 8.9–10.3)
Creatinine, Ser: 0.94 mg/dL (ref 0.61–1.24)
GFR calc non Af Amer: >60 mL/min (ref >60)
Glucose, Bld: 192 mg/dL — ABNORMAL HIGH (ref 65–99)
POTASSIUM: 4.5 mmol/L (ref 3.5–5.1)
SODIUM: 137 mmol/L (ref 135–145)

## 2015-06-25 LAB — PROTIME-INR
INR: 1.39 (ref 0.00–1.49)
PROTHROMBIN TIME: 17.2 s — AB (ref 11.6–15.2)

## 2015-06-25 LAB — APTT: APTT: 27 s (ref 24–37)

## 2015-06-25 SURGERY — INSERTION, ENDOVASCULAR STENT GRAFT, AORTA, ABDOMINAL
Anesthesia: General | Site: Abdomen | Laterality: Right

## 2015-06-25 MED ORDER — METOPROLOL TARTRATE 1 MG/ML IV SOLN: 2.0000 mg | INTRAVENOUS | Status: DC | PRN

## 2015-06-25 MED ORDER — EPHEDRINE SULFATE 50 MG/ML IJ SOLN
INTRAMUSCULAR | Status: AC
  Filled 2015-06-25: qty 1

## 2015-06-25 MED ORDER — IODIXANOL 320 MG/ML IV SOLN
INTRAVENOUS | Status: DC | PRN
  Administered 2015-06-25: 150 mL via INTRAVENOUS

## 2015-06-25 MED ORDER — LABETALOL HCL 5 MG/ML IV SOLN
10.0000 mg | INTRAVENOUS | Status: AC | PRN
  Administered 2015-06-25 – 2015-06-26 (×4): 10 mg via INTRAVENOUS
  Filled 2015-06-25 (×3): qty 4

## 2015-06-25 MED ORDER — ONDANSETRON HCL 4 MG/2ML IJ SOLN
INTRAMUSCULAR | Status: DC | PRN
  Administered 2015-06-25: 4 mg via INTRAVENOUS

## 2015-06-25 MED ORDER — ENSURE ENLIVE PO LIQD
237.0000 mL | Freq: Two times a day (BID) | ORAL | Status: DC
  Administered 2015-06-25 – 2015-06-26 (×2): 237 mL via ORAL

## 2015-06-25 MED ORDER — LISINOPRIL 10 MG PO TABS
10.0000 mg | ORAL_TABLET | Freq: Every day | ORAL | Status: DC
  Administered 2015-06-25 – 2015-06-26 (×2): 10 mg via ORAL
  Filled 2015-06-25 (×2): qty 1

## 2015-06-25 MED ORDER — MAGNESIUM SULFATE 2 GM/50ML IV SOLN
2.0000 g | Freq: Every day | INTRAVENOUS | Status: AC | PRN
  Administered 2015-06-25: 2 g via INTRAVENOUS
  Filled 2015-06-25: qty 50

## 2015-06-25 MED ORDER — PROTAMINE SULFATE 10 MG/ML IV SOLN
INTRAVENOUS | Status: DC | PRN
  Administered 2015-06-25: 50 mg via INTRAVENOUS

## 2015-06-25 MED ORDER — OXYCODONE-ACETAMINOPHEN 5-325 MG PO TABS
1.0000 | ORAL_TABLET | ORAL | Status: DC | PRN
  Administered 2015-06-25 – 2015-06-26 (×4): 1 via ORAL
  Filled 2015-06-25 (×4): qty 1

## 2015-06-25 MED ORDER — HEPARIN SODIUM (PORCINE) 1000 UNIT/ML IJ SOLN
INTRAMUSCULAR | Status: AC
  Filled 2015-06-25: qty 2

## 2015-06-25 MED ORDER — GLIPIZIDE 5 MG PO TABS
5.0000 mg | ORAL_TABLET | Freq: Every day | ORAL | Status: DC
  Administered 2015-06-26: 5 mg via ORAL
  Filled 2015-06-25 (×2): qty 1

## 2015-06-25 MED ORDER — OXYCODONE HCL 5 MG/5ML PO SOLN: 5.0000 mg | Freq: Once | ORAL | Status: AC | PRN

## 2015-06-25 MED ORDER — FENTANYL CITRATE (PF) 100 MCG/2ML IJ SOLN
INTRAMUSCULAR | Status: AC
  Administered 2015-06-25: 50 ug via INTRAVENOUS
  Filled 2015-06-25: qty 2

## 2015-06-25 MED ORDER — LABETALOL HCL 5 MG/ML IV SOLN
5.0000 mg | INTRAVENOUS | Status: AC | PRN
  Administered 2015-06-25 (×2): 5 mg via INTRAVENOUS

## 2015-06-25 MED ORDER — PHENYLEPHRINE HCL 10 MG/ML IJ SOLN
INTRAMUSCULAR | Status: DC | PRN
  Administered 2015-06-25 (×2): 40 ug via INTRAVENOUS

## 2015-06-25 MED ORDER — SODIUM CHLORIDE 0.9 % IR SOLN
Status: DC | PRN
  Administered 2015-06-25: 500 mL

## 2015-06-25 MED ORDER — DEXTROSE 5 % IV SOLN: 1.5000 g | Freq: Two times a day (BID) | INTRAVENOUS | Status: DC

## 2015-06-25 MED ORDER — 0.9 % SODIUM CHLORIDE (POUR BTL) OPTIME
TOPICAL | Status: DC | PRN
  Administered 2015-06-25: 1000 mL

## 2015-06-25 MED ORDER — GLYCOPYRROLATE 0.2 MG/ML IJ SOLN
INTRAMUSCULAR | Status: DC | PRN
Start: 2015-06-25 — End: 2015-06-25
  Administered 2015-06-25: 0.4 mg via INTRAVENOUS

## 2015-06-25 MED ORDER — ROCURONIUM BROMIDE 100 MG/10ML IV SOLN
INTRAVENOUS | Status: DC | PRN
  Administered 2015-06-25: 40 mg via INTRAVENOUS

## 2015-06-25 MED ORDER — BISACODYL 10 MG RE SUPP
10.0000 mg | Freq: Every day | RECTAL | Status: DC | PRN
Start: 2015-06-25 — End: 2015-06-26

## 2015-06-25 MED ORDER — GUAIFENESIN-DM 100-10 MG/5ML PO SYRP
15.0000 mL | ORAL_SOLUTION | ORAL | Status: DC | PRN
Start: 2015-06-25 — End: 2015-06-26

## 2015-06-25 MED ORDER — MORPHINE SULFATE (PF) 2 MG/ML IV SOLN
2.0000 mg | INTRAVENOUS | Status: DC | PRN
  Administered 2015-06-25: 2 mg via INTRAVENOUS
  Filled 2015-06-25: qty 1

## 2015-06-25 MED ORDER — PROMETHAZINE HCL 25 MG/ML IJ SOLN: 6.2500 mg | INTRAMUSCULAR | Status: DC | PRN

## 2015-06-25 MED ORDER — SODIUM CHLORIDE 0.9 % IV SOLN
INTRAVENOUS | Status: DC
  Administered 2015-06-25: 13:00:00 via INTRAVENOUS

## 2015-06-25 MED ORDER — SUCCINYLCHOLINE CHLORIDE 20 MG/ML IJ SOLN
INTRAMUSCULAR | Status: AC
  Filled 2015-06-25: qty 1

## 2015-06-25 MED ORDER — FENTANYL CITRATE (PF) 100 MCG/2ML IJ SOLN
INTRAMUSCULAR | Status: DC | PRN
  Administered 2015-06-25: 25 ug via INTRAVENOUS
  Administered 2015-06-25: 100 ug via INTRAVENOUS
  Administered 2015-06-25 (×5): 50 ug via INTRAVENOUS
  Administered 2015-06-25: 75 ug via INTRAVENOUS
  Administered 2015-06-25: 50 ug via INTRAVENOUS

## 2015-06-25 MED ORDER — PROPOFOL 10 MG/ML IV BOLUS
INTRAVENOUS | Status: DC | PRN
  Administered 2015-06-25: 150 mg via INTRAVENOUS
  Administered 2015-06-25: 20 mg via INTRAVENOUS

## 2015-06-25 MED ORDER — ASPIRIN 81 MG PO CHEW
81.0000 mg | CHEWABLE_TABLET | Freq: Every day | ORAL | Status: DC
  Administered 2015-06-25 – 2015-06-26 (×2): 81 mg via ORAL
  Filled 2015-06-25 (×2): qty 1

## 2015-06-25 MED ORDER — MIDAZOLAM HCL 2 MG/2ML IJ SOLN
INTRAMUSCULAR | Status: AC
  Filled 2015-06-25: qty 4

## 2015-06-25 MED ORDER — PHENOL 1.4 % MT LIQD: 1.0000 | OROMUCOSAL | Status: DC | PRN

## 2015-06-25 MED ORDER — DOCUSATE SODIUM 100 MG PO CAPS
100.0000 mg | ORAL_CAPSULE | Freq: Every day | ORAL | Status: DC
  Administered 2015-06-26: 100 mg via ORAL
  Filled 2015-06-25: qty 1

## 2015-06-25 MED ORDER — IODIXANOL 320 MG/ML IV SOLN
INTRAVENOUS | Status: DC | PRN
  Administered 2015-06-25: 5 mL via INTRAVENOUS

## 2015-06-25 MED ORDER — ACETAMINOPHEN 325 MG PO TABS: 325.0000 mg | ORAL_TABLET | ORAL | Status: DC | PRN

## 2015-06-25 MED ORDER — DEXTROSE 5 % IV SOLN
1.5000 g | Freq: Three times a day (TID) | INTRAVENOUS | Status: DC
  Administered 2015-06-25 – 2015-06-26 (×4): 1.5 g via INTRAVENOUS
  Filled 2015-06-25 (×6): qty 1.5

## 2015-06-25 MED ORDER — ESMOLOL HCL 10 MG/ML IV SOLN
INTRAVENOUS | Status: AC
  Filled 2015-06-25: qty 20

## 2015-06-25 MED ORDER — FENTANYL CITRATE (PF) 250 MCG/5ML IJ SOLN
INTRAMUSCULAR | Status: AC
  Filled 2015-06-25: qty 5

## 2015-06-25 MED ORDER — FENTANYL CITRATE (PF) 100 MCG/2ML IJ SOLN
25.0000 ug | INTRAMUSCULAR | Status: DC | PRN
  Administered 2015-06-25 (×2): 50 ug via INTRAVENOUS

## 2015-06-25 MED ORDER — HYDRALAZINE HCL 20 MG/ML IJ SOLN
INTRAMUSCULAR | Status: DC | PRN
  Administered 2015-06-25: 6 mg via INTRAVENOUS
  Administered 2015-06-25: 2 mg via INTRAVENOUS
  Administered 2015-06-25 (×3): 4 mg via INTRAVENOUS

## 2015-06-25 MED ORDER — METFORMIN HCL 500 MG PO TABS
1000.0000 mg | ORAL_TABLET | Freq: Every day | ORAL | Status: DC
  Filled 2015-06-25 (×2): qty 2

## 2015-06-25 MED ORDER — ALUM & MAG HYDROXIDE-SIMETH 200-200-20 MG/5ML PO SUSP: 15.0000 mL | ORAL | Status: DC | PRN

## 2015-06-25 MED ORDER — PNEUMOCOCCAL VAC POLYVALENT 25 MCG/0.5ML IJ INJ
0.5000 mL | INJECTION | INTRAMUSCULAR | Status: AC
  Administered 2015-06-26: 0.5 mL via INTRAMUSCULAR
  Filled 2015-06-25: qty 0.5

## 2015-06-25 MED ORDER — LIDOCAINE HCL (CARDIAC) 20 MG/ML IV SOLN
INTRAVENOUS | Status: DC | PRN
  Administered 2015-06-25: 100 mg via INTRAVENOUS

## 2015-06-25 MED ORDER — LABETALOL HCL 5 MG/ML IV SOLN
INTRAVENOUS | Status: AC
  Filled 2015-06-25: qty 4

## 2015-06-25 MED ORDER — MIDAZOLAM HCL 5 MG/5ML IJ SOLN
INTRAMUSCULAR | Status: DC | PRN
  Administered 2015-06-25: 1 mg via INTRAVENOUS

## 2015-06-25 MED ORDER — NEOSTIGMINE METHYLSULFATE 10 MG/10ML IV SOLN
INTRAVENOUS | Status: DC | PRN
  Administered 2015-06-25: 3 mg via INTRAVENOUS

## 2015-06-25 MED ORDER — HYDRALAZINE HCL 20 MG/ML IJ SOLN
5.0000 mg | INTRAMUSCULAR | Status: DC | PRN
  Administered 2015-06-26: 5 mg via INTRAVENOUS
  Filled 2015-06-25: qty 1

## 2015-06-25 MED ORDER — PHENYLEPHRINE 40 MCG/ML (10ML) SYRINGE FOR IV PUSH (FOR BLOOD PRESSURE SUPPORT)
PREFILLED_SYRINGE | INTRAVENOUS | Status: AC
  Filled 2015-06-25: qty 10

## 2015-06-25 MED ORDER — LACTATED RINGERS IV SOLN
INTRAVENOUS | Status: DC | PRN
  Administered 2015-06-25: 07:00:00 via INTRAVENOUS

## 2015-06-25 MED ORDER — LIDOCAINE HCL (CARDIAC) 20 MG/ML IV SOLN
INTRAVENOUS | Status: AC
  Filled 2015-06-25: qty 10

## 2015-06-25 MED ORDER — ONDANSETRON HCL 4 MG/2ML IJ SOLN: 4.0000 mg | Freq: Four times a day (QID) | INTRAMUSCULAR | Status: DC | PRN

## 2015-06-25 MED ORDER — HEPARIN SODIUM (PORCINE) 1000 UNIT/ML IJ SOLN
INTRAMUSCULAR | Status: DC | PRN
  Administered 2015-06-25: 7000 [IU] via INTRAVENOUS
  Administered 2015-06-25: 2000 [IU] via INTRAVENOUS

## 2015-06-25 MED ORDER — POTASSIUM CHLORIDE CRYS ER 20 MEQ PO TBCR: 20.0000 meq | EXTENDED_RELEASE_TABLET | Freq: Every day | ORAL | Status: DC | PRN

## 2015-06-25 MED ORDER — ACETAMINOPHEN 650 MG RE SUPP: 325.0000 mg | RECTAL | Status: DC | PRN

## 2015-06-25 MED ORDER — PROTAMINE SULFATE 10 MG/ML IV SOLN
INTRAVENOUS | Status: AC
  Filled 2015-06-25: qty 5

## 2015-06-25 MED ORDER — SODIUM CHLORIDE 0.9 % IJ SOLN
INTRAMUSCULAR | Status: AC
  Filled 2015-06-25: qty 10

## 2015-06-25 MED ORDER — OXYCODONE HCL 5 MG PO TABS
ORAL_TABLET | ORAL | Status: AC
  Administered 2015-06-25: 5 mg via ORAL
  Filled 2015-06-25: qty 1

## 2015-06-25 MED ORDER — SODIUM CHLORIDE 0.9 % IV SOLN: 500.0000 mL | Freq: Once | INTRAVENOUS | Status: DC | PRN

## 2015-06-25 MED ORDER — ROCURONIUM BROMIDE 50 MG/5ML IV SOLN
INTRAVENOUS | Status: AC
  Filled 2015-06-25: qty 1

## 2015-06-25 MED ORDER — PANTOPRAZOLE SODIUM 40 MG PO TBEC
40.0000 mg | DELAYED_RELEASE_TABLET | Freq: Every day | ORAL | Status: DC
  Administered 2015-06-25 – 2015-06-26 (×2): 40 mg via ORAL
  Filled 2015-06-25 (×2): qty 1

## 2015-06-25 MED ORDER — SENNOSIDES-DOCUSATE SODIUM 8.6-50 MG PO TABS
1.0000 | ORAL_TABLET | Freq: Every evening | ORAL | Status: DC | PRN
  Filled 2015-06-25: qty 1

## 2015-06-25 MED ORDER — ESMOLOL HCL 10 MG/ML IV SOLN
INTRAVENOUS | Status: DC | PRN
  Administered 2015-06-25: 40 mg via INTRAVENOUS
  Administered 2015-06-25: 30 mg via INTRAVENOUS
  Administered 2015-06-25: 50 mg via INTRAVENOUS
  Administered 2015-06-25: 30 mg via INTRAVENOUS

## 2015-06-25 MED ORDER — LABETALOL HCL 5 MG/ML IV SOLN
INTRAVENOUS | Status: AC
  Administered 2015-06-25: 5 mg via INTRAVENOUS
  Filled 2015-06-25: qty 4

## 2015-06-25 MED ORDER — INSULIN ASPART 100 UNIT/ML ~~LOC~~ SOLN
0.0000 [IU] | Freq: Three times a day (TID) | SUBCUTANEOUS | Status: DC
  Administered 2015-06-26 (×2): 2 [IU] via SUBCUTANEOUS

## 2015-06-25 MED ORDER — PHENYLEPHRINE HCL 10 MG/ML IJ SOLN
10.0000 mg | INTRAVENOUS | Status: DC | PRN
  Administered 2015-06-25: 10 ug/min via INTRAVENOUS

## 2015-06-25 MED ORDER — GLYCOPYRROLATE 0.2 MG/ML IJ SOLN
INTRAMUSCULAR | Status: AC
  Filled 2015-06-25: qty 4

## 2015-06-25 MED ORDER — ONDANSETRON HCL 4 MG/2ML IJ SOLN
INTRAMUSCULAR | Status: AC
  Filled 2015-06-25: qty 4

## 2015-06-25 MED ORDER — PROPOFOL 10 MG/ML IV BOLUS
INTRAVENOUS | Status: AC
  Filled 2015-06-25: qty 20

## 2015-06-25 MED ORDER — LABETALOL HCL 5 MG/ML IV SOLN
INTRAVENOUS | Status: DC | PRN
  Administered 2015-06-25: 5 mg via INTRAVENOUS
  Administered 2015-06-25: 2.5 mg via INTRAVENOUS
  Administered 2015-06-25 (×2): 5 mg via INTRAVENOUS
  Administered 2015-06-25: 2.5 mg via INTRAVENOUS

## 2015-06-25 MED ORDER — OXYCODONE HCL 5 MG PO TABS
5.0000 mg | ORAL_TABLET | Freq: Once | ORAL | Status: AC | PRN
  Administered 2015-06-25: 5 mg via ORAL

## 2015-06-25 MED ORDER — ENOXAPARIN SODIUM 40 MG/0.4ML ~~LOC~~ SOLN
40.0000 mg | SUBCUTANEOUS | Status: DC
  Administered 2015-06-26: 40 mg via SUBCUTANEOUS
  Filled 2015-06-25 (×2): qty 0.4

## 2015-06-25 MED ORDER — ATORVASTATIN CALCIUM 40 MG PO TABS
40.0000 mg | ORAL_TABLET | Freq: Every day | ORAL | Status: DC
  Administered 2015-06-25 – 2015-06-26 (×2): 40 mg via ORAL
  Filled 2015-06-25 (×2): qty 1

## 2015-06-25 SURGICAL SUPPLY — 68 items
CANISTER SUCTION 2500CC (MISCELLANEOUS) ×4 IMPLANT
CATH ACCU-VU SIZ PIG 5F 70CM (CATHETERS) ×4 IMPLANT
CATH ANGIO 5F BER2 65CM (CATHETERS) ×4 IMPLANT
CATH BEACON 5.038 65CM KMP-01 (CATHETERS) IMPLANT
CATH CROSS OVER TEMPO 5F (CATHETERS) ×4 IMPLANT
CATH OMNI FLUSH .035X70CM (CATHETERS) IMPLANT
CATH SOFTOUCH MOTARJEME 5F (CATHETERS) ×4 IMPLANT
CATH SOS OMNI O 5F 80CM (CATHETERS) ×4 IMPLANT
COVER PROBE W GEL 5X96 (DRAPES) ×4 IMPLANT
DEVICE CLOSURE PERCLS PRGLD 6F (VASCULAR PRODUCTS) ×8 IMPLANT
DEVICE TORQUE KENDALL .025-038 (MISCELLANEOUS) ×4 IMPLANT
DRESSING OPSITE X SMALL 2X3 (GAUZE/BANDAGES/DRESSINGS) ×4 IMPLANT
DRSG TEGADERM 2-3/8X2-3/4 SM (GAUZE/BANDAGES/DRESSINGS) ×8 IMPLANT
DRYSEAL FLEXSHEATH 12FR 33CM (SHEATH) ×2
DRYSEAL FLEXSHEATH 16FR 33CM (SHEATH) ×2
ELECT REM PT RETURN 9FT ADLT (ELECTROSURGICAL) ×8
ELECTRODE REM PT RTRN 9FT ADLT (ELECTROSURGICAL) ×4 IMPLANT
EXCLUDER TNK LEG 26MX14X14 (Endovascular Graft) ×2 IMPLANT
EXCLUDER TRUNK LEG 26MX14X14 (Endovascular Graft) ×4 IMPLANT
GAUZE SPONGE 2X2 8PLY STRL LF (GAUZE/BANDAGES/DRESSINGS) ×2 IMPLANT
GLOVE BIO SURGEON STRL SZ7 (GLOVE) ×8 IMPLANT
GLOVE BIO SURGEON STRL SZ7.5 (GLOVE) ×8 IMPLANT
GLOVE BIOGEL PI IND STRL 6.5 (GLOVE) ×4 IMPLANT
GLOVE BIOGEL PI IND STRL 7.0 (GLOVE) ×2 IMPLANT
GLOVE BIOGEL PI IND STRL 7.5 (GLOVE) ×2 IMPLANT
GLOVE BIOGEL PI IND STRL 8 (GLOVE) ×4 IMPLANT
GLOVE BIOGEL PI INDICATOR 6.5 (GLOVE) ×4
GLOVE BIOGEL PI INDICATOR 7.0 (GLOVE) ×2
GLOVE BIOGEL PI INDICATOR 7.5 (GLOVE) ×2
GLOVE BIOGEL PI INDICATOR 8 (GLOVE) ×4
GLOVE SS BIOGEL STRL SZ 6.5 (GLOVE) ×2 IMPLANT
GLOVE SUPERSENSE BIOGEL SZ 6.5 (GLOVE) ×2
GOWN STRL REUS W/ TWL LRG LVL3 (GOWN DISPOSABLE) ×6 IMPLANT
GOWN STRL REUS W/TWL LRG LVL3 (GOWN DISPOSABLE) ×6
GRAFT BALLN CATH 65CM (STENTS) ×2 IMPLANT
GRAFT EXCLUDER LEG 12X12 (Endovascular Graft) ×4 IMPLANT
GRAFT EXCLUDER LEG 16X10 (Endovascular Graft) ×8 IMPLANT
GUIDEWIRE ANGLED .035X150CM (WIRE) ×4 IMPLANT
KIT BASIN OR (CUSTOM PROCEDURE TRAY) ×4 IMPLANT
KIT ROOM TURNOVER OR (KITS) ×4 IMPLANT
LIQUID BAND (GAUZE/BANDAGES/DRESSINGS) ×4 IMPLANT
NEEDLE PERC 18GX7CM (NEEDLE) ×4 IMPLANT
NS IRRIG 1000ML POUR BTL (IV SOLUTION) ×4 IMPLANT
PACK ENDOVASCULAR (PACKS) ×4 IMPLANT
PAD ARMBOARD 7.5X6 YLW CONV (MISCELLANEOUS) ×8 IMPLANT
PERCLOSE PROGLIDE 6F (VASCULAR PRODUCTS) ×16
SET MICROPUNCTURE 5F STIFF (MISCELLANEOUS) ×4 IMPLANT
SHEATH AVANTI 11CM 8FR (MISCELLANEOUS) IMPLANT
SHEATH BRITE TIP 8FR 23CM (MISCELLANEOUS) ×4 IMPLANT
SHEATH DRYSEAL FLEX 12FR 33CM (SHEATH) ×2 IMPLANT
SHEATH DRYSEAL FLEX 16FR 33CM (SHEATH) ×2 IMPLANT
SPONGE GAUZE 2X2 STER 10/PKG (GAUZE/BANDAGES/DRESSINGS) ×2
STAPLER VISISTAT 35W (STAPLE) IMPLANT
STENT GRAFT BALLN CATH 65CM (STENTS) ×2
STOPCOCK MORSE 400PSI 3WAY (MISCELLANEOUS) ×4 IMPLANT
SUT ETHILON 3 0 PS 1 (SUTURE) IMPLANT
SUT MNCRL AB 4-0 PS2 18 (SUTURE) ×8 IMPLANT
SUT PROLENE 5 0 C 1 24 (SUTURE) IMPLANT
SUT VIC AB 2-0 CTX 36 (SUTURE) IMPLANT
SUT VIC AB 3-0 SH 27 (SUTURE)
SUT VIC AB 3-0 SH 27X BRD (SUTURE) IMPLANT
SYR 30ML LL (SYRINGE) ×4 IMPLANT
TRAY FOLEY W/METER SILVER 16FR (SET/KITS/TRAYS/PACK) ×4 IMPLANT
TUBING HIGH PRESSURE 120CM (CONNECTOR) ×4 IMPLANT
WIRE AMPLATZ SS-J .035X180CM (WIRE) ×8 IMPLANT
WIRE BENTSON .035X145CM (WIRE) ×12 IMPLANT
WIRE HI TORQ VERSACORE J 260CM (WIRE) ×4 IMPLANT
WIRE ROSEN 145CM (WIRE) ×4 IMPLANT

## 2015-06-25 NOTE — Transfer of Care (Addendum)
Immediate Anesthesia Transfer of Care Note  Patient: Charles Medina  Procedure(s) Performed: Procedure(s): ABDOMINAL AORTIC ENDOVASCULAR STENT GRAFT (N/A)  Patient Location: PACU  Anesthesia Type:General  Level of Consciousness: awake, alert  and oriented  Airway & Oxygen Therapy: Patient Spontanous Breathing and Patient connected to nasal cannula oxygen  Post-op Assessment: Report given to RN and Post -op Vital signs reviewed and stable  Post vital signs: Reviewed and stable  Last Vitals:  Filed Vitals:   06/25/15 1054  BP:   Pulse:   Temp: 36.6 C  Resp:     Complications: No apparent anesthesia complications   Continued high systolic BP >283 on arterial line. Fitzgerald to come to PACU bedside. Pt A/O. VSS.

## 2015-06-25 NOTE — Progress Notes (Signed)
Notified Dr. Linna Caprice of patient stating water 300 am (patient stated a quart initially , now says only 1 pint).

## 2015-06-25 NOTE — Op Note (Signed)
OPERATIVE NOTE   PROCEDURE: 1. Left common femoral artery cannulation under ultrasound guidance 2. "Preclose" repair of right common femoral artery  3. Placement of catheter in aorta  4. Aortogram 5. Placement of bifurcated aortic endograft (26 mm x 14 mm x 14 cm) 6. Placement of right iliac limb extension (10 mm x 7 cm) 7. Radiologic S&I  (Dictated by Dr. Scot Dock) 8. Right femoral artery cannulation under ultrasound guidance 9. "Preclose" repair of right common femoral artery                              10. Placement of catheter in aorta  11. Placement of left iliac limb (12 mm x 10 cm) 12. Placement of left iliac limb extension (10 mm x 7 cm)  PRE-OPERATIVE DIAGNOSIS: asymptomatic large abdominal aortic aneurysm and asymptomatic common iliac artery aneurysms  POST-OPERATIVE DIAGNOSIS: same as above   CO-SURGEONS: Adele Barthel, MD; Gae Gallop, MD  ANESTHESIA: general  ESTIMATED BLOOD LOSS: 50 cc  FINDING(S): 1. Successful exclusion of abdominal aortic aneurysm with preservation of bilateral renal arteries  2.  Bilateral internal iliac artery occlusion 3. No endoleak 4. Dopplerable posterior tibial artery signals bilaterally at end of case  SPECIMEN(S): none  INDICATIONS:  Charles Medina is a 74 y.o. (1941-10-02) male who presents with large asymptomatic abdominal aortic aneurysm with bilateral common iliac artery aneurysm.  His right common iliac artery was too large to be sealed, so extension to the right external iliac artery was necessary.  Subsequently, we discussed proceeding with endovascular repair of her infrarenal aortic aneurysm and embolization of right internal iliac artery. The patient is aware the risks of endovascular aortic surgery include but are not limited to: bleeding, need for transfusion, infection, death, stroke, paralysis, wound complications, bowel ischemia, pelvic ischemia, extended ventilation, anaphylactic reaction to contrast, contrast  induced nephropathy, embolism, and need for additional procedure to address endoleaks. Overall, I cited a mortality rate of 1-2% and morbidity rate of 15%.  DESCRIPTION: After obtaining full informed written consent, the patient was brought back to the operating room and placed supine upon the operating table. The patient received IV antibiotics prior to induction. After obtaining adequate anesthesia, the patient was prepped and draped in the standard fashion for: aortic and endovascular aortic repair. At this point, I turned my attention to the left groin. Under Sonosite guidance, I cannulated the right common femoral artery with a micropuncture needle.  I passed a microwire up the left common femoral artery and then loaded a microsheath. A Benson wire was passed into the aorta.  The first Proglide device was loaded over the wire into the artery, the wire was removed and the device advanced until a flash of blood was obtained through the side port. I deployed the device with 30 degrees medial rotation. The proglide sutures were removed and tagged. The [facility] wire was reloaded through the used Proglide device. I loaded another Proglide over the wire in iliac artery. This time the device was was deployed at 30 degree lateral rotation. The device was removed and then a 8-Fr Destination sheath was loaded into the left common iliac artery. At this point, I selected the right common iliac artery and tried to cross with a variety of wires and catheter.  Ultimately, no combination would track into the right external iliac artery due to severe angulation in the proximal external iliac artery and a calcified shelf of plaque in  the iliac bifurcation.     At this point, Dr. Scot Dock turned his attention to the right common femoral artery. Dr. Scot Dock cannulated this artery under ultrasound guidance and performed the Preclose technique on this artery, as described above.  He placed a long  8-Fr Sheath over  a Benson wire.  I then passed a Versacore into the distal aorta.  Dr. Scot Dock then snared this wire with a loop snare and pulled the wire into the right sheath while I advanced the Versacore wire.  I then reloaded the dilator in the left sheath and passed it into the proximal right common iliac artery.  This sheath was now in position for embolization of the right internal iliac artery.  Dr. Scot Dock did a hand injection of the right pelvis to image the right internal iliac artery's position.  On this image, the right internal iliac artery appeared to be already occluded in the mid-segment down to its bifurcation.  Subsequently, I did not feel embolization was going to be needed.  I retracted the Versacore wire and pulled the left sheath back into the aorta.  I redirected the sheath and wire back into the suprarenal aorta.  At this point, Dr. Scot Dock advanced his wire into the suprarenal aorta and exchanged his wire for an Amplatz wire (see his notes for details).  The right sheath was exchanged for a 12-Fr Dryseal sheath.  Similarly at this point, I exchanged the left wire for an Amplatz.  I exchanged the left sheath for a 16-Fr Dryseal sheath which I advanced into the aorta. Dr. Scot Dock loaded the pigtail over the right wire and removed the wire. The pigtail catheter was positioned in the suprarenal position for an aortogram.I loaded the main body through the left sheath: C3 26 mm x 14 mm x 14 cm in the infrarenal position. A power injector aortogram was completed to identify the location of the left renal artery which was the lowest renal artery. I deployed the main body main body in what I thought was the immediate infrarenal position. Completion aortogram demonstrated a downward tick of the graft ~0.5 cm.  I reconstrained the main body and repositioned it more proximal.  It appeared to be too high, so I reconstrained it and this time it appeared to lay just distal to the left renal artery.  Completion  aortogram demonstrated: patency over both renal arteries with near flush deployment next to left renal artery.    At this point, Dr. Scot Dock placed an Amplatz wire through the pigtail catheter.  He then placed a buddy wire, Glidewire, through the right femoral sheath with a 8-Fr dilator. Using a BER-2 and glidewire, I was able to cannulate the contralateral gate. I advanced the catheter and wire into the suprarenal aorta. The Amplatz wire was removed and used to replace the Glidewire.  The catheter was exchanged for a pigtail catheter. The wire was pulled down and the catheter was rotated at the top of the endograft to demonstrated intragraft position of the catheter. The Amplatz wire was replaced in this catheter and the catheter pulled down the flow divider.   A retrograde injection from the right femoral sheath demonstrated the position of the residual right internal iliac artery. Based on the length measurements, a left limb (12 mm x 10 cm) was selected. Dr. Scot Dock positioned this graft with adequate overlap, and he deployed the limb. The stent delivery device was removed. A limb extension was selected (10 mm x 7 cm) and used to  extend into the right external iliac artery.  The stent delivery device was removed.  See Dr. Scot Dock note for details.  At this point, I pulled back the left femoral sheath and performed a retrograde injection on the left side to confirm that the left internal iliac artery was occluded.  I found no internal iliac artery on this side.  I fully released the ipsilateral limb and detached the main body.  The stent delivery device was removed.  I selected a 10 mm x 7 cm limb extension and deployed it with adequate overlap.  This gave adequate extension into the left common iliac artery.  At this point, Dr. Scot Dock passed the Q50 balloon up the right sheath and inflated the molding balloon at the proximal aortic endograft, at the flow divider and at the end of the right  iliac limb. The balloon was deflated and placed on the left wire. I molded the overlapping points of the limbs and the distal iliac limb.  A pigtail catheter was loaded on the right side and the left wire was exchanged for a [employer] wire. A completion aortogram was completed. Based on the images, there appeared to be no endoleak, widely patent renal arteries and no evidence of continued internal iliac artery flow, and intact external iliac artery blood flow.    Dr. Scot Dock placed a Britta Mccreedy wire in the right pigtail catheter and removed the catheter. I then held pressure while Dr. Scot Dock removed the right sheath.  Dr. Scot Dock then completed the repair of the right common femoral artery with the previously placed Proglide sutures.  See his notes for details.  Dr. Scot Dock then held pressure on the left groin, while I removed the sheath.  I then repaired the left common femoral artery with the previously placed Proglide sutures.  The patient was given 50 mg of Protamine to reverse anticoagulation. Both incisions were washed out. No active bleeding was noted. Both groins incisions were repaired with U-stitches of 4-0 Monocryl.  The skin at both incisions was cleaned, dried, and reinforced with Dermabond. Sterile bandages were applied to both groins  Distally there were dopplerable posterior tibial artery signals.   COMPLICATIONS: none  CONDITION: stable   Adele Barthel, MD [facility] [place] Office: [phone] Pager: [phone]  06/25/2015, 10:40 AM

## 2015-06-25 NOTE — Anesthesia Procedure Notes (Signed)
Procedure Name: Intubation Date/Time: 06/25/2015 7:58 AM Performed by: Merdis Delay Pre-anesthesia Checklist: Patient identified, Emergency Drugs available, Suction available, Patient being monitored and Timeout performed Patient Re-evaluated:Patient Re-evaluated prior to inductionOxygen Delivery Method: Circle system utilized Preoxygenation: Pre-oxygenation with 100% oxygen Intubation Type: IV induction Ventilation: Mask ventilation without difficulty and Oral airway inserted - appropriate to patient size Laryngoscope Size: Mac and 3 Grade View: Grade II Tube type: Oral Tube size: 7.5 mm Number of attempts: 1 Airway Equipment and Method: Stylet Placement Confirmation: breath sounds checked- equal and bilateral,  CO2 detector,  positive ETCO2 and ETT inserted through vocal cords under direct vision Secured at: 22 cm Tube secured with: Tape Dental Injury: Teeth and Oropharynx as per pre-operative assessment

## 2015-06-25 NOTE — H&P (View-Only) (Signed)
Established Abdominal Aortic Aneurysm  History of Present Illness  The patient is a 74 y.o. ([birth year]) male who presents with chief complaint: follow up for AAA.  Pt recently had CTA abd/pelvis which identified a 6.7 cm AAA compatible with EVAR.  The patient does no have back or abdominal pain.  The patient is not a smoker.  Past Medical History  Diagnosis Date  . Hypertension   . Diabetes mellitus without complication   . Arthritis   . Prostate cancer     Past Surgical History  Procedure Laterality Date  . Prostatectomy  2011  . Colonoscopy  11/14/2012    Procedure: COLONOSCOPY;  Surgeon: Rogene Houston, MD;  Location: [facility];  Service: Endoscopy;  Laterality: N/A;  830    Social History   Social History  . Marital Status: Widowed    Spouse Name: N/A  . Number of Children: 6  . Years of Education: N/A   Occupational History  . Not on file.   Social History Main Topics  . Smoking status: Former Smoker -- 0.50 packs/day for 23 years    Types: Cigarettes    Start date: 05/08/1956    Quit date: 11/14/1978  . Smokeless tobacco: Never Used  . Alcohol Use: No  . Drug Use: No  . Sexual Activity: Yes   Other Topics Concern  . Not on file   Social History Narrative    Family History  Problem Relation Age of Onset  . Family history unknown: Yes     Current Outpatient Prescriptions  Medication Sig Dispense Refill  . aspirin 81 MG tablet Take 81 mg by mouth daily.    Marland Kitchen atorvastatin (LIPITOR) 40 MG tablet Take 1 tablet (40 mg total) by mouth daily. 30 tablet 5  . glipiZIDE (GLUCOTROL) 5 MG tablet TAKE 1 TABLET BY MOUTH TWICE DAILY BEFORE A MEAL. 60 tablet 5  . HYDROcodone-acetaminophen (NORCO/VICODIN) 5-325 MG per tablet Take 1 tablet by mouth 2 (two) times daily as needed. With food 42 tablet 0  . lisinopril (PRINIVIL,ZESTRIL) 10 MG tablet Take 1 tablet (10 mg total) by mouth daily. 15 tablet 5  . metFORMIN (GLUCOPHAGE) 1000 MG tablet TAKE (1) TABLET BY  MOUTH TWICE DAILY. 180 tablet 5   No current facility-administered medications for this visit.     No Known Allergies   REVIEW OF SYSTEMS: (Positives checked otherwise negative)  CARDIOVASCULAR:  [ ]  chest pain,  [ ]  chest pressure,  [ ]  palpitations,  [ ]  shortness of breath when laying flat,  [ ]  shortness of breath with exertion,  [x]  pain in feet when walking,  [x]  pain in feet when laying flat, [ ]  history of blood clot in veins (DVT),  [ ]  history of phlebitis,  [ ]  swelling in legs,  [ ]  varicose veins  PULMONARY:  [ ]  productive cough,  [ ]  asthma,  [ ]  wheezing  NEUROLOGIC:  [ ]  weakness in arms or legs,  [x]  numbness in arms or legs,  [ ]  difficulty speaking or slurred speech,  [ ]  temporary loss of vision in one eye,  [ ]  dizziness  HEMATOLOGIC:  [ ]  bleeding problems,  [ ]  problems with blood clotting too easily  MUSCULOSKEL:  [ ]  joint pain, [ ]  joint swelling  GASTROINTEST:  [ ]  vomiting blood,  [x]  painful stool: 5 yrs ago  GENITOURINARY:  [ ]  burning with urination,  [x]  painful urination: 5 yrs ago  PSYCHIATRIC:  [ ]   history of major depression  INTEGUMENTARY:  [ ]  rashes,  [ ]  ulcers  CONSTITUTIONAL:  [ ]  fever,  [ ]  chills    For VQI Use Only  PRE-ADM LIVING: Home  AMB STATUS: Ambulatory  CAD Sx: None  PRIOR CHF: None  STRESS TEST: [x]  No, [ ]  Normal, [ ]  + ischemia, [ ]  + MI, [ ]  Both   Physical Examination  Filed Vitals:   06/19/15 0831 06/19/15 0833  BP: 161/89 168/81  Pulse: 72   Temp: 98.5 F (36.9 C)   TempSrc: Oral   Height: 5\' 10"  (1.778 m)   Weight: 162 lb 8 oz (73.71 kg)   SpO2: 100%    Body mass index is 23.32 kg/(m^2).  General: A&O x 3, WDWN  Pulmonary: Sym exp, good air movt, CTAB, no rales, rhonchi, & wheezing  Cardiac: RRR, Nl S1, S2, no Murmurs, rubs or gallops  Vascular: Vessel Right Left  Radial Palpable Palpable  Brachial Palpable  Palpable  Carotid Palpable, with bruit Palpable, without bruit  Aorta Not palpable N/A  Femoral Palpable Palpable  Popliteal Not palpable Not palpable  PT Faintly Palpable Faintly Palpable  DP Palpable Palpable   Gastrointestinal: soft, NTND, no G/R, no HSM, no masses, no CVAT B  Musculoskeletal: M/S 5/5 throughout , Extremities without ischemic changes   Neurologic:  Pain and light touch intact in extremities , Motor exam as listed above  CTA Abd/Pelvis (06/16/15) Vascular Impression:  1. Large infrarenal abdominal aortic aneurysm measuring approximately 6.7 cm in maximal diameter extends to asymmetrically involve the origin and proximal aspect of the right common iliac artery. There is a large amount of mixed calcified and largely noncalcified irregular mural thrombus within the dominant aneurysmal component without evidence of abdominal aortic dissection or periaortic stranding. 2. Suspected hemodynamically significant stenoses involving the origin of the celiac and left common iliac arteries. Nonvascular Impression:  1. Colonic diverticulosis without evidence of diverticulitis.  Based on my review of this patient's CTA, he has a large infrarenal AAA with adequate neck and access vessels for an EVAR   Medical Decision Making  The patient is a 74 y.o. male who presents with: asymptomatic large AAA    Based on this patient's exam and diagnostic studies, he needs EVAR. The patient is aware the risks of endovascular aortic surgery include but are not limited to: bleeding, need for transfusion, infection, death, stroke, paralysis, wound complications, bowel ischemia, extended ventilation, anaphylactic reaction to contrast, contrast induced nephropathy, embolism, and need for additional procedure to address endoleaks.   Overall, I cited a mortality rate of 1-2% and morbidity rate of 15%.  Tenatively, he will be scheduled for EVAR on 18 AUG 16 vs 25 AUG 16.  Patient has no  chest pain and is able to to climb >2 flights of stairs daily without chest pain, so he should be able to tolerate the procedure.  Will get a clearance letter from Cardiology if they agree.  I emphasized the importance of maximal medical management including strict control of blood pressure, blood glucose, and lipid levels, antiplatelet agents, obtaining regular exercise, and cessation of smoking.    Thank you for allowing [place] to participate in this patient's care.   Adele Barthel, MD [facility] [place] Office: [phone] Pager: [phone]  06/19/2015, 9:12 AM

## 2015-06-25 NOTE — Interval H&P Note (Signed)
[facility] [place]  History and Physical Update  The patient was interviewed and re-examined.  The patient's previous History and Physical has been reviewed and is unchanged from consult.  Will plan on R IIA embolization, EVAR.  The patient is aware the risks of endovascular aortic surgery include but are not limited to: bleeding, need for transfusion, infection, death, stroke, paralysis, wound complications, bowel ischemia, pelvic ischemia, extended ventilation, anaphylactic reaction to contrast, contrast induced nephropathy, embolism, and need for additional procedure to address endoleaks.  Overall, I cited a mortality rate of 1-2% and morbidity rate of 15%.   Adele Barthel, MD [facility] [place] Office: [phone] Pager: [phone]  06/25/2015, 7:14 AM

## 2015-06-25 NOTE — Op Note (Signed)
    NAME: [name]    MRN: [id number] DOB: 01-16-1941    DATE OF OPERATION: 06/25/2015  PREOP DIAGNOSIS: abdominal aortic aneurysm  POSTOP DIAGNOSIS: same  PROCEDURE: 1. Ultrasound-guided access to the right common femoral artery 2. Pre-closure of right common femoral artery using 2 pro glide devices 3. Introduction of contralateral iliac limb 2  CO-SURGEON's: Judeth Cornfield. Scot Dock, MD, Dr. Adele Barthel  ANESTHESIA: Gen.   EBL: per anesthesia record  INDICATIONS: [name] is a 74 y.o. male who presents for elective repair of an abdominal aortic aneurysm.  FINDINGS: completion arteriogram shows excellent position of graft with no evidence of endoleak.  TECHNIQUE: On the right side, after the left side had been previously cannulated as dictated, under ultrasound guidance the right common femoral artery was cannulated and the tract dilated with an 8 French sheath. 2 Perclose devices were placed. The initial device was rotated 10 medially and the second device was rotated 10 laterally. A long 8 French sheath was then placed on the right.  The Bentson wire was exchanged for an Amplatz wire over the Berenstein to catheter and then the 12 French sheath introduced over the Amplatz wire on the right side. After the trunk ipsilateral component had been deployed as dictated the contralateral gate was cannulated using a Berenstein to catheter and an angled Glidewire. Position was confirmed by turning the pigtail catheter within the body of the graft. The Amplatz wire was then repositioned in the initial iliac limb was advanced into the contralateral gate. This was a 12 mm x 10 cm Gore iliac limb. A second 10 mm x 7 cm Gore iliac limb was then overlapped into the initial iliac limb.  The proximal graft and overlap regions in addition to the distal graft were angioplastied  With theGore Q-50 balloon. Completion films showed an excellent result with no endo-leaks noted and excellent position of the  graft.  At the completion, the previously placed prone glide sutures were secured and there was good hemostasis. The wire was removed and the sutures secured. There were then cut. The small incision was closed with a 4-0 Monocryl.  The patient tolerated the procedure well and was transferred to the recovery room in stable condition. All needle and sponge counts were correct.  Deitra Mayo, MD, FACS [facility]  DATE OF DICTATION:   06/25/2015

## 2015-06-25 NOTE — Anesthesia Preprocedure Evaluation (Addendum)
Anesthesia Evaluation  Patient identified by MRN, date of birth, ID band Patient awake    Reviewed: Allergy & Precautions, NPO status , Patient's Chart, lab work & pertinent test results  Airway Mallampati: II  TM Distance: >3 FB Neck ROM: Full    Dental  (+) Edentulous Upper, Edentulous Lower, Dental Advisory Given   Pulmonary former smoker,  breath sounds clear to auscultation        Cardiovascular hypertension, Pt. on medications + Peripheral Vascular Disease Rhythm:Regular Rate:Normal     Neuro/Psych negative neurological ROS     GI/Hepatic negative GI ROS, Neg liver ROS,   Endo/Other  diabetes, Type 2, Oral Hypoglycemic Agents  Renal/GU negative Renal ROS     Musculoskeletal  (+) Arthritis -,   Abdominal   Peds  Hematology negative hematology ROS (+)   Anesthesia Other Findings   Reproductive/Obstetrics                           Anesthesia Physical Anesthesia Plan  ASA: III  Anesthesia Plan: General   Post-op Pain Management:    Induction: Intravenous  Airway Management Planned: Oral ETT  Additional Equipment: Arterial line  Intra-op Plan:   Post-operative Plan: Extubation in OR  Informed Consent: I have reviewed the patients History and Physical, chart, labs and discussed the procedure including the risks, benefits and alternatives for the proposed anesthesia with the patient or authorized representative who has indicated his/her understanding and acceptance.   Dental advisory given  Plan Discussed with: CRNA  Anesthesia Plan Comments:         Anesthesia Quick Evaluation

## 2015-06-25 NOTE — Progress Notes (Addendum)
  Day of Surgery Note    Subjective:  Sleeping-awakes to voice  Filed Vitals:   06/25/15 1054  BP:   Pulse:   Temp: 97.9 F (36.6 C)  Resp:     Incisions:   Bilateral groins are soft without hematoma Extremities:  +audible doppler signals right DP/PT and left monophasic PT Cardiac:  regular Lungs:  Non labored Abdomen:  Soft, NT/ND  Assessment/Plan:  This is a 74 y.o. male who is s/p EVAR  -pt doing well in PACU -no left DP pulse able to be found with doppler.  Discussed with Dr. Bridgett Larsson and will wait for pt to warm up and since he does have a monophasic PT and is asymptomatic, will watch. -to [place] when bed available.   Leontine Locket, PA-C 06/25/2015 11:11 AM  Addendum  Pt asx with completely intact motor and sensation in feet.  Palpable R PT with biphasic L PT.  I did not feel B DP in this pt before the start of the case, so I suspect my pre-op exam must have been incorrect.  A-line and BP also almost 30-40 mm Hg different.  A-line waveform appears to be ok, so will assume that to be correct for now.  Adele Barthel, MD [facility] [place] Office: [phone] Pager: [phone]  06/25/2015, 12:59 PM

## 2015-06-25 NOTE — Anesthesia Postprocedure Evaluation (Signed)
  Anesthesia Post-op Note  Patient: Charles Medina  Procedure(s) Performed: Procedure(s): ABDOMINAL AORTIC ENDOVASCULAR STENT GRAFT (N/A)  Patient Location: PACU  Anesthesia Type:General  Level of Consciousness: awake and alert   Airway and Oxygen Therapy: Patient Spontanous Breathing  Post-op Pain: none  Post-op Assessment: Post-op Vital signs reviewed LLE Motor Response: Purposeful movement, Responds to commands LLE Sensation: No numbness RLE Motor Response: Purposeful movement, Responds to commands RLE Sensation: No numbness      Post-op Vital Signs: Reviewed  Last Vitals:  Filed Vitals:   06/25/15 1320  BP:   Pulse: 86  Temp:   Resp: 12    Complications: No apparent anesthesia complications

## 2015-06-26 ENCOUNTER — Encounter (HOSPITAL_COMMUNITY): Payer: Self-pay | Admitting: Vascular Surgery

## 2015-06-26 ENCOUNTER — Other Ambulatory Visit: Payer: Self-pay | Admitting: *Deleted

## 2015-06-26 ENCOUNTER — Ambulatory Visit: Payer: Medicare Other

## 2015-06-26 DIAGNOSIS — I714 Abdominal aortic aneurysm, without rupture, unspecified: Secondary | ICD-10-CM

## 2015-06-26 DIAGNOSIS — Z48812 Encounter for surgical aftercare following surgery on the circulatory system: Secondary | ICD-10-CM

## 2015-06-26 DIAGNOSIS — I739 Peripheral vascular disease, unspecified: Secondary | ICD-10-CM

## 2015-06-26 LAB — CBC
HCT: 33.2 % — ABNORMAL LOW (ref 39.0–52.0)
HEMOGLOBIN: 10.9 g/dL — AB (ref 13.0–17.0)
MCH: 31 pg (ref 26.0–34.0)
MCHC: 32.8 g/dL (ref 30.0–36.0)
MCV: 94.3 fL (ref 78.0–100.0)
Platelets: 101 10*3/uL — ABNORMAL LOW (ref 150–400)
RBC: 3.52 MIL/uL — ABNORMAL LOW (ref 4.22–5.81)
RDW: 15.2 % (ref 11.5–15.5)
WBC: 7.6 10*3/uL (ref 4.0–10.5)

## 2015-06-26 LAB — BASIC METABOLIC PANEL
Anion gap: 8 (ref 5–15)
BUN: 14 mg/dL (ref 6–20)
CHLORIDE: 104 mmol/L (ref 101–111)
CO2: 24 mmol/L (ref 22–32)
CREATININE: 1.39 mg/dL — AB (ref 0.61–1.24)
Calcium: 8.5 mg/dL — ABNORMAL LOW (ref 8.9–10.3)
GFR calc Af Amer: 55 mL/min — ABNORMAL LOW (ref >60)
GFR calc non Af Amer: 48 mL/min — ABNORMAL LOW (ref >60)
GLUCOSE: 175 mg/dL — AB (ref 65–99)
Potassium: 4.2 mmol/L (ref 3.5–5.1)
SODIUM: 136 mmol/L (ref 135–145)

## 2015-06-26 LAB — GLUCOSE, CAPILLARY
GLUCOSE-CAPILLARY: 160 mg/dL — AB (ref 65–99)
GLUCOSE-CAPILLARY: 189 mg/dL — AB (ref 65–99)
GLUCOSE-CAPILLARY: 221 mg/dL — AB (ref 65–99)

## 2015-06-26 LAB — HEMOGLOBIN A1C
Hgb A1c MFr Bld: 6.6 % — ABNORMAL HIGH (ref 4.8–5.6)
Mean Plasma Glucose: 143 mg/dL

## 2015-06-26 MED ORDER — TRAMADOL HCL 50 MG PO TABS
50.0000 mg | ORAL_TABLET | Freq: Four times a day (QID) | ORAL | Status: DC | PRN
  Administered 2015-06-26: 50 mg via ORAL
  Filled 2015-06-26: qty 1

## 2015-06-26 MED ORDER — TRAMADOL HCL 50 MG PO TABS: 50.0000 mg | ORAL_TABLET | Freq: Four times a day (QID) | ORAL | Status: DC | PRN

## 2015-06-26 MED ORDER — OXYCODONE-ACETAMINOPHEN 5-325 MG PO TABS: 1.0000 | ORAL_TABLET | Freq: Four times a day (QID) | ORAL | Status: DC | PRN

## 2015-06-26 NOTE — Care Management Note (Signed)
Case Management Note  Patient Details  Name: Charles Medina MRN: [id number] Date of Birth: November 01, 1941  Subjective/Objective:                 Admitted with AAA   Action/Plan: Discharge planning  Expected Discharge Date:                  Expected Discharge Plan:  Home/Self Care  In-House Referral:     Discharge planning Services  CM Consult  Post Acute Care Choice:    Choice offered to:     DME Arranged:    DME Agency:     HH Arranged:    HH Agency:     Status of Service:  In process, will continue to follow  Medicare Important Message Given:    Date Medicare IM Given:    Medicare IM give by:    Date Additional Medicare IM Given:    Additional Medicare Important Message give by:     If discussed at [place] of Stay Meetings, dates discussed:    Additional Comments: Charles Medina (Daughter) [phone] Charles Medina (Daughter) [phone]  Sharin Mons, RN 06/26/2015, 8:33 AM

## 2015-06-26 NOTE — Progress Notes (Signed)
Patient discharge instructions reviewed with the patient and two daughters who were at bedside. Prescription handed to patient. Incision instructions reviewed. Patient and family verbalize understanding of all instructions.  No physical therapy needs were required. Patient was able to ambulate around unit without any equipment and assistance. Gait was stable. IVs were removed, pressure held for a few minutes as bleeding was continual. Bleeding stopped. Patient was observed for another few minutes to ensure no more bleeding. All of the patients questions were answered. VSS. Pt is in no acute distress. Patient will be going home with his daughter, whom he lives with. Patient discharged via wheelchair.

## 2015-06-26 NOTE — Progress Notes (Addendum)
[facility] [place] a little dizzy when he got up to the chair this am and a little nauseated.   Objective 136/66 91 99.4 F (37.4 C) (Oral) 10 97%  Intake/Output Summary (Last 24 hours) at 06/26/15 0711 Last data filed at 06/26/15 0600  Gross per 24 hour  Intake 4748.75 ml  Output   2175 ml  Net 2573.75 ml    Doppler PT bilaterally biphasic Groins soft clean and dry Abdomin soft Heat RRR  Assessment/Planning: POD # 1 EVAR  He has tolerated PO's well He needs to ambulate and void before he is D/C'd I added Tramadol for pain due to nausea to try BP elevated given Labetalol IV will monitor Stable Plan to discharge later today   Laurence Slate [facility] 06/26/2015 7:11 AM --  Laboratory Lab Results:  Recent Labs  06/25/15 1119 06/25/15 1715  WBC 6.8 7.0  HGB 7.4* 11.5*  HCT 22.1* 35.0*  PLT 93* 103*   BMET  Recent Labs  06/25/15 1119 06/25/15 1715 06/26/15 0420  NA 137  --  136  K 4.5  --  4.2  CL 109  --  104  CO2 22  --  24  GLUCOSE 192*  --  175*  BUN 13  --  14  CREATININE 0.94 1.43* 1.39*  CALCIUM 8.0*  --  8.5*    COAG Lab Results  Component Value Date   INR 1.39 06/25/2015   INR 1.17 06/23/2015   No results found for: PTT    Addendum  I have independently interviewed and examined the patient, and I agree with the physician assistant's findings.  Suspect nausea due to narcotics: chg to tramadol.  Hypertensive but not getting his home PO regimen: suspect will be controlled once started.  Ok to D/C home.  Pt continues to be asx from his feet with dopplerable PT signal.  This patient's L sheath was occlusive, so I have no concern of distal embolization.  This suggests that my preop exam was correct that he does not have DP pulses and has pre-existing PAD.  Follow up in office in one month with CTA.  Adele Barthel, MD [facility] [place] Office: [phone] Pager:  [phone]  06/26/2015, 7:53 AM

## 2015-06-26 NOTE — Progress Notes (Signed)
Pt stood up to attempt to ambulate this AM and he c/o of dizziness. Pt sat down in chair and still continues to c/o of dizziness and a headache. BP is 168/72. 1 percocet and 10mg  labetalol IV given. Gwenette Greet PA is at the bedside and made aware. Will continue to monitor.

## 2015-06-26 NOTE — Progress Notes (Signed)
Initial Nutrition Assessment  DOCUMENTATION CODES:   Not applicable  INTERVENTION:    Ensure Enlive po BID, each supplement provides 350 kcal and 20 grams of protein  NUTRITION DIAGNOSIS:   Inadequate oral intake related to poor appetite as evidenced by meal completion < 50%.  GOAL:   Patient will meet greater than or equal to 90% of their needs  MONITOR:   PO intake, Supplement acceptance, Weight trends, Labs  REASON FOR ASSESSMENT:   Malnutrition Screening Tool    ASSESSMENT:   Patient admitted on 8/18 S/P AAA repair with stent.   Spoke with patient and his daughter. He has been eating well at home. For the past month has been unable to go to the gym for weight lifting, which has caused him to lose weight. Nutrition-Focused physical exam completed. Findings are no fat depletion, mild-moderate muscle depletion, and no edema. Patient has been drinking Ensure between meals. Plans for d/c home today.  Diet Order:  Diet heart healthy/carb modified Room service appropriate?: Yes; Fluid consistency:: Thin  Skin:  Reviewed, no issues  Last BM:  8/17  Height:   Ht Readings from Last 1 Encounters:  06/25/15 5\' 9"  (1.753 m)    Weight:   Wt Readings from Last 1 Encounters:  06/25/15 160 lb 7.9 oz (72.8 kg)    Ideal Body Weight:  72.7 kg  BMI:  Body mass index is 23.69 kg/(m^2).  Estimated Nutritional Needs:   Kcal:  2000-2200  Protein:  100-115 gm  Fluid:  2-2.2 L  EDUCATION NEEDS:   Education needs addressed   Molli Barrows, [place], [place], [place] Pager [phone] After Hours Pager [phone]

## 2015-06-29 ENCOUNTER — Ambulatory Visit: Payer: Medicare Other

## 2015-06-30 ENCOUNTER — Ambulatory Visit: Payer: Medicare Other

## 2015-06-30 NOTE — Discharge Summary (Signed)
[facility] Discharge Summary   Patient ID:  Charles Medina MRN: [id number] DOB/AGE: Sep 12, 1941 74 y.o.  Admit date: 06/25/2015 Discharge date: 06/26/2015 Date of Surgery: 06/25/2015 Surgeon: Surgeon(s): Conrad [place]avia, MD Angelia Mould, MD  Admission Diagnosis: Abdominal aortic aneurysm I71.4  Discharge Diagnoses:  Abdominal aortic aneurysm I71.4  Secondary Diagnoses: Past Medical History  Diagnosis Date  . Hypertension   . Diabetes mellitus without complication   . Arthritis   . Prostate cancer   . Type 2 diabetes mellitus   . AAA (abdominal aortic aneurysm)     Procedure(s): ABDOMINAL AORTIC ENDOVASCULAR STENT GRAFT  Discharged Condition: good  HPI: 74 y.o. (12/19/1940) male who presents with chief complaint: follow up for AAA. Pt recently had CTA abd/pelvis which identified a 6.7 cm AAA compatible with EVAR. The patient does no have back or abdominal pain. The patient is not a smoker.    Hospital Course:  Charles Medina is a 74 y.o. male is S/P  Procedure(s): ABDOMINAL AORTIC ENDOVASCULAR STENT GRAFT  POD#1    He has tolerated PO's well He needs to ambulate and void before he is D/C'd I added Tramadol for pain due to nausea to try BP elevated given Labetalol IV will monitor Stable Plan to discharge today  F/U with Dr. Bridgett Larsson in 4 weeks with CTA abdomin and pelvis  Significant Diagnostic Studies: CBC Lab Results  Component Value Date   WBC 7.6 06/26/2015   HGB 10.9* 06/26/2015   HCT 33.2* 06/26/2015   MCV 94.3 06/26/2015   PLT 101* 06/26/2015    BMET    Component Value Date/Time   NA 136 06/26/2015 0420   K 4.2 06/26/2015 0420   CL 104 06/26/2015 0420   CO2 24 06/26/2015 0420   GLUCOSE 175* 06/26/2015 0420   BUN 14 06/26/2015 0420   CREATININE 1.39* 06/26/2015 0420   CREATININE 0.93 06/12/2015 1214   CALCIUM 8.5* 06/26/2015 0420   GFRNONAA 48* 06/26/2015 0420   GFRAA 55* 06/26/2015 0420   COAG Lab Results  Component  Value Date   INR 1.39 06/25/2015   INR 1.17 06/23/2015     Disposition:  Discharge to :Home Discharge Instructions    Call MD for:  redness, tenderness, or signs of infection (pain, swelling, bleeding, redness, odor or green/yellow discharge around incision site)    Complete by:  As directed      Call MD for:  severe or increased pain, loss or decreased feeling  in affected limb(s)    Complete by:  As directed      Call MD for:  temperature >100.5    Complete by:  As directed      Discharge instructions    Complete by:  As directed   You may shower in 24 hours, band aide over incisions both  groins as needed     Driving Restrictions    Complete by:  As directed   No driving While taking pain medication     Lifting restrictions    Complete by:  As directed   No lifting for 6 weeks     Resume previous diet    Complete by:  As directed             Medication List    TAKE these medications        aspirin 81 MG tablet  Take 81 mg by mouth daily.     atorvastatin 40 MG tablet  Commonly known as:  LIPITOR  Take 1 tablet (40 mg total) by mouth daily.     glipiZIDE 5 MG tablet  Commonly known as:  GLUCOTROL  TAKE 1 TABLET BY MOUTH TWICE DAILY BEFORE A MEAL.     lisinopril 10 MG tablet  Commonly known as:  PRINIVIL,ZESTRIL  Take 1 tablet (10 mg total) by mouth daily.     metFORMIN 1000 MG tablet  Commonly known as:  GLUCOPHAGE  TAKE (1) TABLET BY MOUTH TWICE DAILY.     oxyCODONE-acetaminophen 5-325 MG per tablet  Commonly known as:  PERCOCET/ROXICET  Take 1 tablet by mouth every 6 (six) hours as needed for moderate pain.     traMADol 50 MG tablet  Commonly known as:  ULTRAM  Take 1 tablet (50 mg total) by mouth every 6 (six) hours as needed for moderate pain.       Verbal and written Discharge instructions given to the patient. Wound care per Discharge AVS     Follow-up Information    Follow up with Adele Barthel, MD In 4 weeks.   Specialties:  Vascular  Surgery, Cardiology   Why:  sent message to Office   Contact information:   [address] [place] [address] [phone]       Signed: Laurence Slate [facility] 06/30/2015, 4:17 PM   Addendum  I have independently interviewed and examined the patient, and I agree with the physician assistant's discharge summary.  Pt underwent uneventful EVAR.  During the case, the R IIA was found to be already embolized on imaging.  Pt will follow up in the office in one month with CTA.    Adele Barthel, MD [facility] [place] Office: [phone] Pager: [phone]  07/01/2015, 9:53 AM   - For VQI Registry use --- Instructions: Press F2 to tab through selections.  Delete question if not applicable.   Post-op:  Time to Extubation: [x ] In OR, [ ]  < 12 hrs, [ ]  12-24 hrs, [ ]  >=24 hrs Vasopressors Req. Post-op: No MI: [ x] No, [ ]  Troponin only, [ ]  EKG or Clinical New Arrhythmia: No CHF: No ICU Stay: 1 days Transfusion: No  If yes, 0 units given  Complications: Resp failure: [x ] none, [ ]  Pneumonia, [ ]  Ventilator Chg in renal function: [x ] none, [ ]  Inc. Cr > 0.5, [ ]  Temp. Dialysis, [ ]  Permanent dialysis Leg ischemia: [x ] No, [ ]  Yes, no Surgery needed, [ ]  Yes, Surgery needed, [ ]  Amputation Bowel ischemia: [x ] No, [ ]  Medical Rx, [ ]  Surgical Rx Wound complication: [x ] No, [ ]  Superficial separation/infection, [ ]  Return to OR Return to OR: No  Return to OR for bleeding: No Stroke: [ x] None, [ ]  Minor, [ ]  Major  Discharge medications: Statin use:  Yes ASA use:  Yes Plavix use:  No  for medical reason   Beta blocker use:  No  for medical reason

## 2015-07-01 ENCOUNTER — Ambulatory Visit: Payer: Medicare Other

## 2015-07-01 ENCOUNTER — Telehealth: Payer: Self-pay | Admitting: Vascular Surgery

## 2015-07-01 NOTE — Telephone Encounter (Addendum)
-----   Message from Mena Goes, RN sent at 06/26/2015 10:52 AM EDT ----- Regarding: Schedule   ----- Message -----    From: Conrad [place], MD    Sent: 06/26/2015   7:56 AM      To: [facility] [address]  Charles Medina [id number] 1941/09/22  On follow-up please do B ABI also.  notified patient of cta appt. on 07-24-15 at 11, then post op appt. on 07-31-15 2:30 for abi's, then 3:15 to see dr. Bridgett Larsson

## 2015-07-02 ENCOUNTER — Ambulatory Visit: Payer: Medicare Other

## 2015-07-03 ENCOUNTER — Ambulatory Visit: Payer: Medicare Other

## 2015-07-06 ENCOUNTER — Ambulatory Visit: Payer: Medicare Other

## 2015-07-07 ENCOUNTER — Ambulatory Visit: Payer: Medicare Other

## 2015-07-08 ENCOUNTER — Ambulatory Visit: Payer: Medicare Other

## 2015-07-09 ENCOUNTER — Ambulatory Visit: Payer: Medicare Other

## 2015-07-10 ENCOUNTER — Ambulatory Visit: Payer: Medicare Other

## 2015-07-14 ENCOUNTER — Ambulatory Visit: Payer: Medicare Other

## 2015-07-15 ENCOUNTER — Ambulatory Visit: Payer: Medicare Other

## 2015-07-16 ENCOUNTER — Ambulatory Visit: Payer: Medicare Other

## 2015-07-17 ENCOUNTER — Ambulatory Visit: Payer: Medicare Other

## 2015-07-20 ENCOUNTER — Ambulatory Visit: Payer: Medicare Other

## 2015-07-21 ENCOUNTER — Ambulatory Visit: Payer: Medicare Other

## 2015-07-22 ENCOUNTER — Ambulatory Visit: Payer: Medicare Other

## 2015-07-23 ENCOUNTER — Ambulatory Visit: Payer: Medicare Other

## 2015-07-24 ENCOUNTER — Ambulatory Visit
Admission: RE | Admit: 2015-07-24 | Discharge: 2015-07-24 | Disposition: A | Discharge status: Home / Self Care | Payer: Medicare Other | Source: Ambulatory Visit | Attending: Surgery | Admitting: Surgery

## 2015-07-24 ENCOUNTER — Ambulatory Visit: Payer: Medicare Other

## 2015-07-24 DIAGNOSIS — I739 Peripheral vascular disease, unspecified: Secondary | ICD-10-CM

## 2015-07-24 DIAGNOSIS — Z48812 Encounter for surgical aftercare following surgery on the circulatory system: Secondary | ICD-10-CM

## 2015-07-24 DIAGNOSIS — I714 Abdominal aortic aneurysm, without rupture, unspecified: Secondary | ICD-10-CM

## 2015-07-24 MED ORDER — IOPAMIDOL (ISOVUE-370) INJECTION 76%
75.0000 mL | Freq: Once | INTRAVENOUS | Status: AC | PRN
  Administered 2015-07-24: 75 mL via INTRAVENOUS

## 2015-07-27 ENCOUNTER — Ambulatory Visit: Payer: Medicare Other

## 2015-07-28 ENCOUNTER — Ambulatory Visit: Payer: Medicare Other

## 2015-07-29 ENCOUNTER — Ambulatory Visit: Payer: Medicare Other

## 2015-07-30 ENCOUNTER — Ambulatory Visit: Payer: Medicare Other

## 2015-07-31 ENCOUNTER — Encounter: Payer: Medicare Other | Admitting: Vascular Surgery

## 2015-07-31 ENCOUNTER — Ambulatory Visit: Payer: Medicare Other

## 2015-07-31 ENCOUNTER — Encounter (HOSPITAL_COMMUNITY): Payer: Medicare Other

## 2015-08-03 ENCOUNTER — Ambulatory Visit: Payer: Medicare Other

## 2015-08-04 ENCOUNTER — Ambulatory Visit: Payer: Medicare Other

## 2015-08-05 ENCOUNTER — Ambulatory Visit: Payer: Medicare Other

## 2015-08-05 ENCOUNTER — Encounter: Payer: Self-pay | Admitting: Vascular Surgery

## 2015-08-06 ENCOUNTER — Ambulatory Visit: Payer: Medicare Other

## 2015-08-07 ENCOUNTER — Ambulatory Visit (INDEPENDENT_AMBULATORY_CARE_PROVIDER_SITE_OTHER): Payer: Self-pay | Admitting: Vascular Surgery

## 2015-08-07 ENCOUNTER — Encounter: Payer: Self-pay | Admitting: Vascular Surgery

## 2015-08-07 ENCOUNTER — Ambulatory Visit (HOSPITAL_COMMUNITY)
Admission: RE | Admit: 2015-08-07 | Discharge: 2015-08-07 | Disposition: A | Discharge status: Home / Self Care | Payer: Medicare Other | Source: Ambulatory Visit | Attending: Vascular Surgery | Admitting: Vascular Surgery

## 2015-08-07 VITALS — BP 158/71 | HR 77 | Temp 99.0°F | Resp 16 | Ht 70.5 in | Wt 156.0 lb

## 2015-08-07 DIAGNOSIS — Z48812 Encounter for surgical aftercare following surgery on the circulatory system: Secondary | ICD-10-CM | POA: Insufficient documentation

## 2015-08-07 DIAGNOSIS — E119 Type 2 diabetes mellitus without complications: Secondary | ICD-10-CM | POA: Insufficient documentation

## 2015-08-07 DIAGNOSIS — I714 Abdominal aortic aneurysm, without rupture, unspecified: Secondary | ICD-10-CM

## 2015-08-07 DIAGNOSIS — I10 Essential (primary) hypertension: Secondary | ICD-10-CM | POA: Diagnosis not present

## 2015-08-07 DIAGNOSIS — I739 Peripheral vascular disease, unspecified: Secondary | ICD-10-CM | POA: Diagnosis not present

## 2015-08-07 NOTE — Addendum Note (Signed)
Addended by: Dorthula Rue L on: 08/07/2015 03:09 PM   Modules accepted: Orders

## 2015-08-07 NOTE — Progress Notes (Signed)
    Post-operative EVAR   History of Present Illness  Charles Medina is a 74 y.o. male who presents post-op s/p EVAR (Date: 06/25/15). Most recent CTA (Date: 08/07/2015) demonstrates: no endoleak and stable sac size.  The patient has not had back or abdominal pain.  This patient denies any sx of CMI and denies any intermittent claudication.  For VQI Use Only  PRE-ADM LIVING: Home  AMB STATUS: Ambulatory  Physical Examination  Filed Vitals:   08/07/15 1331  BP: 158/71  Pulse: 77  Temp:   Resp:     Vascular: Vessel Right Left  Aorta Non-palpable N/A  Femoral  Palpable  Palpable  Popliteal  Non-palpable  Non-palpable  PT Faintly Palpable Faintly Palpable  DP Palpable Faintly Palpable   Gastrointestinal: soft, NTND, -G/R, - HSM, - masses, - CVAT B  Non-Invasive Vascular Imaging  CTA Abd & pelvis (Date: 08/07/2015)  AAA sac size: 7.0 cm x 5.8 cm  No endoleak detected  Based on my review of this patient's CTA, his endograft is in good infrarenal position without endoleak.  Both limbs are widely patent.  I do not see the severe celiac stenosis.  SMA appears widely patent.  ABI (Date: 08/07/2015)  R:   ABI: 0.76,   DP: mono,   PT: mono,   TBI: 0.64  L:   ABI: 0.65,   DP: mono,   PT: mono,   TBI: 0.56   Medical Decision Making  Charles Medina is a 74 y.o. male who presents s/p EVAR.  Pt is asymptomatic with stable sac size, BLE PAD   In regards to the celiac stenosis, this patient has NO CMI sx.  With a high grade celiac stenosis and covered IMA, I would expect this patient to be sx.  In regards to his patient's PAD, his ABI demonstrate what I had suspected.  This patient has chronic SFA occlusion well compensated as evident with normal blood flow at the level of the toes on the right nearly normal on the left.  No intervention for the legs is necessary.  I discussed with the patient the importance of surveillance of the endograft.  The next endograft  duplex will be scheduled for 6 months.  The next CT will be scheduled for 12 months.  The patient will follow up with [place] in 6 months with these studies.  Thank you for allowing [place] to participate in this patient's care.  Adele Barthel, MD [facility] [place] Office: [phone] Pager: [phone]  08/07/2015, 1:56 PM

## 2015-08-07 NOTE — Progress Notes (Signed)
Filed Vitals:   08/07/15 1326 08/07/15 1331  BP: 162/73 158/71  Pulse: 74 77  Temp: 99 F (37.2 C)   TempSrc: Oral   Resp: 16   Height: 5' 10.5" (1.791 m)   Weight: 156 lb (70.761 kg)   SpO2: 100%

## 2015-09-15 ENCOUNTER — Encounter: Payer: Self-pay | Admitting: *Deleted

## 2015-09-21 ENCOUNTER — Ambulatory Visit (INDEPENDENT_AMBULATORY_CARE_PROVIDER_SITE_OTHER): Payer: Medicare Other | Admitting: Family Medicine

## 2015-09-21 ENCOUNTER — Encounter: Payer: Self-pay | Admitting: Family Medicine

## 2015-09-21 VITALS — BP 132/72 | Ht 68.5 in | Wt 162.0 lb

## 2015-09-21 DIAGNOSIS — Z23 Encounter for immunization: Secondary | ICD-10-CM

## 2015-09-21 DIAGNOSIS — E785 Hyperlipidemia, unspecified: Secondary | ICD-10-CM | POA: Diagnosis not present

## 2015-09-21 DIAGNOSIS — Z79899 Other long term (current) drug therapy: Secondary | ICD-10-CM | POA: Diagnosis not present

## 2015-09-21 DIAGNOSIS — E119 Type 2 diabetes mellitus without complications: Secondary | ICD-10-CM | POA: Diagnosis not present

## 2015-09-21 DIAGNOSIS — I10 Essential (primary) hypertension: Secondary | ICD-10-CM

## 2015-09-21 NOTE — Progress Notes (Signed)
   Subjective:    Patient ID: Charles Medina, male    DOB: 1941-04-13, 74 y.o.   MRN: [id number]  Diabetes He presents for his follow-up diabetic visit. He has type 2 diabetes mellitus. There are no hypoglycemic associated symptoms. Pertinent negatives for hypoglycemia include no confusion. There are no diabetic associated symptoms. Pertinent negatives for diabetes include no chest pain, no fatigue, no polydipsia, no polyphagia and no weakness. There are no hypoglycemic complications. Symptoms are stable. There are no diabetic complications. There are no known risk factors for coronary artery disease. Current diabetic treatment includes oral agent (dual therapy). He is compliant with treatment all of the time.   Last A1C was 2 months ago. Never had a diabetic eye exam.   Patient has no concerns at this time.    patient denies any low sugar spells Patient had aortic aneurysm repaired with the stent he is doing well with this although he's been out of exercise for several months and is loss of fair amount of muscle mass   patient does relate he takes his blood pressure medicine on regular basis tries watch his diet  patient denies any low sugar spells   patient denies any bleeding issues is taking 81 mg aspirin  patient is taking his cholesterol medicine states tolerating it well  Patient has not had exam    Review of Systems  Constitutional: Negative for activity change, appetite change and fatigue.  HENT: Negative for congestion.   Respiratory: Negative for cough.   Cardiovascular: Negative for chest pain.  Gastrointestinal: Negative for abdominal pain.  Endocrine: Negative for polydipsia and polyphagia.  Neurological: Negative for weakness.  Psychiatric/Behavioral: Negative for confusion.       Objective:   Physical Exam  Constitutional: He appears well-nourished. No distress.  Cardiovascular: Normal rate, regular rhythm and normal heart sounds.   No murmur heard. Pulmonary/Chest:  Effort normal and breath sounds normal. No respiratory distress.  Musculoskeletal: He exhibits no edema.  Lymphadenopathy:    He has no cervical adenopathy.  Neurological: He is alert.  Psychiatric: His behavior is normal.  Vitals reviewed.    diabetic foot exam was completed today as part of his visit      Assessment & Plan:   eye exam was recommended the patient will set this up No signs of cardiac disease currently Blood pressure very good currently continue medication  diabetes under very good control A1c recently looked good no low sugar spells continue medication  patient with hyperlipidemia continue medication check lipid liver profile  urine micro- protein will be checked as well as patient wasn't counseled to continue his medications including 81 mg aspirin

## 2015-09-22 DIAGNOSIS — E785 Hyperlipidemia, unspecified: Secondary | ICD-10-CM | POA: Diagnosis not present

## 2015-09-22 DIAGNOSIS — Z79899 Other long term (current) drug therapy: Secondary | ICD-10-CM | POA: Diagnosis not present

## 2015-09-22 DIAGNOSIS — E119 Type 2 diabetes mellitus without complications: Secondary | ICD-10-CM | POA: Diagnosis not present

## 2015-09-23 ENCOUNTER — Ambulatory Visit (INDEPENDENT_AMBULATORY_CARE_PROVIDER_SITE_OTHER): Payer: Medicare Other | Admitting: Cardiology

## 2015-09-23 ENCOUNTER — Encounter: Payer: Self-pay | Admitting: Cardiology

## 2015-09-23 ENCOUNTER — Encounter: Payer: Self-pay | Admitting: Family Medicine

## 2015-09-23 VITALS — BP 154/84 | HR 78 | Ht 70.0 in | Wt 159.0 lb

## 2015-09-23 DIAGNOSIS — E785 Hyperlipidemia, unspecified: Secondary | ICD-10-CM

## 2015-09-23 DIAGNOSIS — I10 Essential (primary) hypertension: Secondary | ICD-10-CM

## 2015-09-23 LAB — HEPATIC FUNCTION PANEL
ALBUMIN: 4.3 g/dL (ref 3.5–4.8)
ALK PHOS: 85 IU/L (ref 39–117)
ALT: 18 IU/L (ref 0–44)
AST: 15 IU/L (ref 0–40)
Bilirubin Total: 0.3 mg/dL (ref 0.0–1.2)
Bilirubin, Direct: 0.09 mg/dL (ref 0.00–0.40)
Total Protein: 7.1 g/dL (ref 6.0–8.5)

## 2015-09-23 LAB — BASIC METABOLIC PANEL
BUN/Creatinine Ratio: 11 (ref 10–22)
BUN: 12 mg/dL (ref 8–27)
CHLORIDE: 101 mmol/L (ref 97–106)
CO2: 22 mmol/L (ref 18–29)
Calcium: 9.4 mg/dL (ref 8.6–10.2)
Creatinine, Ser: 1.11 mg/dL (ref 0.76–1.27)
GFR calc Af Amer: 74 mL/min/{1.73_m2} (ref >59)
GFR, EST NON AFRICAN AMERICAN: 64 mL/min/{1.73_m2} (ref >59)
GLUCOSE: 171 mg/dL — AB (ref 65–99)
POTASSIUM: 4.4 mmol/L (ref 3.5–5.2)
SODIUM: 143 mmol/L (ref 136–144)

## 2015-09-23 LAB — LIPID PANEL
CHOL/HDL RATIO: 3.4 ratio (ref 0.0–5.0)
CHOLESTEROL TOTAL: 121 mg/dL (ref 100–199)
HDL: 36 mg/dL — AB (ref >39)
LDL Calculated: 73 mg/dL (ref 0–99)
TRIGLYCERIDES: 60 mg/dL (ref 0–149)
VLDL Cholesterol Cal: 12 mg/dL (ref 5–40)

## 2015-09-23 LAB — MICROALBUMIN / CREATININE URINE RATIO
Creatinine, Urine: 114.6 mg/dL
MICROALB/CREAT RATIO: 53.2 mg/g creat — ABNORMAL HIGH (ref 0.0–30.0)
MICROALBUM., U, RANDOM: 61 ug/mL

## 2015-09-23 LAB — COMMENT

## 2015-09-23 MED ORDER — ATORVASTATIN CALCIUM 80 MG PO TABS: 80.0000 mg | ORAL_TABLET | Freq: Every day | ORAL | Status: DC

## 2015-09-23 NOTE — Progress Notes (Signed)
Patient ID: Charles Medina, male   DOB: 1941-06-30, 74 y.o.   MRN: [id number]     Clinical Summary Mr. Tellechea is a 74 y.o.male seen today for follow up of the following medical problems.   1. HL - 09/2015 TC 121 TG 60 HDL 36 LDL 73 - compliant with lipitor  2. HTN - compliant with meds, but has not taken yet today. - visit with Dr Wolfgang Phoenix few days ago bp was 130/70   3. AAA - s/p EVAR 06/2015, followed by vascular  4. PAD - followed by vascular  5. DM2 - followed by pcp    Past Medical History  Diagnosis Date  . Hypertension   . Diabetes mellitus without complication ([place])   . Arthritis   . Prostate cancer ([place])   . Type 2 diabetes mellitus ([place])   . AAA (abdominal aortic aneurysm) ([facility])      No Known Allergies   Current Outpatient Prescriptions  Medication Sig Dispense Refill  . aspirin 81 MG tablet Take 81 mg by mouth daily.    Marland Kitchen atorvastatin (LIPITOR) 40 MG tablet Take 1 tablet (40 mg total) by mouth daily. 30 tablet 5  . glipiZIDE (GLUCOTROL) 5 MG tablet TAKE 1 TABLET BY MOUTH TWICE DAILY BEFORE A MEAL. 60 tablet 5  . lisinopril (PRINIVIL,ZESTRIL) 10 MG tablet Take 1 tablet (10 mg total) by mouth daily. 15 tablet 5  . metFORMIN (GLUCOPHAGE) 1000 MG tablet TAKE (1) TABLET BY MOUTH TWICE DAILY. 180 tablet 5  . traMADol (ULTRAM) 50 MG tablet Take 1 tablet (50 mg total) by mouth every 6 (six) hours as needed for moderate pain. 30 tablet 0   No current facility-administered medications for this visit.     Past Surgical History  Procedure Laterality Date  . Prostatectomy  2011  . Colonoscopy  11/14/2012    Procedure: COLONOSCOPY;  Surgeon: Rogene Houston, MD;  [facility];  Service: Endoscopy;  Laterality: N/A;  830  . Abdominal aortic endovascular stent graft N/A 06/25/2015    Procedure: ABDOMINAL AORTIC ENDOVASCULAR STENT GRAFT;  Surgeon: Conrad [place], MD;  Location: [facility] OR;  Service: Vascular;  Laterality: N/A;     No Known Allergies    Family  History  Problem Relation Age of Onset  . Family history unknown: Yes     Social History Mr. Enoch reports that he quit smoking about 43 years ago. His smoking use included Cigarettes. He started smoking about 59 years ago. He has a 11.5 pack-year smoking history. He has never used smokeless tobacco. Mr. Paxson reports that he does not drink alcohol.   Review of Systems CONSTITUTIONAL: No weight loss, fever, chills, weakness or fatigue.  HEENT: Eyes: No visual loss, blurred vision, double vision or yellow sclerae.No hearing loss, sneezing, congestion, runny nose or sore throat.  SKIN: No rash or itching.  CARDIOVASCULAR: per HPI RESPIRATORY: No shortness of breath, cough or sputum.  GASTROINTESTINAL: No anorexia, nausea, vomiting or diarrhea. No abdominal pain or blood.  GENITOURINARY: No burning on urination, no polyuria NEUROLOGICAL: No headache, dizziness, syncope, paralysis, ataxia, numbness or tingling in the extremities. No change in bowel or bladder control.  MUSCULOSKELETAL: No muscle, back pain, joint pain or stiffness.  LYMPHATICS: No enlarged nodes. No history of splenectomy.  PSYCHIATRIC: No history of depression or anxiety.  ENDOCRINOLOGIC: No reports of sweating, cold or heat intolerance. No polyuria or polydipsia.  Marland Kitchen   Physical Examination Filed Vitals:   09/23/15 0807  BP: 154/84  Pulse: 78   Filed Vitals:   09/23/15 0807  Height: 5\' 10"  (1.778 m)  Weight: 159 lb (72.122 kg)    Gen: resting comfortably, no acute distress HEENT: no scleral icterus, pupils equal round and reactive, no palptable cervical adenopathy,  CV: RRR, no m/r/g, no jvd. Right carotid bruit Resp: Clear to auscultation bilaterally GI: abdomen is soft, non-tender, non-distended, normal bowel sounds, no hepatosplenomegaly MSK: extremities are warm, no edema.  Skin: warm, no rash Neuro:  no focal deficits Psych: appropriate affect      Assessment and Plan  1. HL - in setting of known  PAD change to high dose statin, start atorva 80mg  daily  2. HTN - elevated in clinic, however patient has not taken his meds yet. At recent pcp visit he was at goal. Continue current meds  3. AAA - s/p evar, continue to follow with vascular  F/u 1 year      Arnoldo Lenis, M.D.

## 2015-09-23 NOTE — Patient Instructions (Addendum)
   Increase Lipitor to 80mg  daily - may take 2 tabs of your 40mg  tablet till finish current supply.  New prescription sent to [facility] today. Continue all other medications.   Your physician wants you to follow up in:  1 year.  You will receive a reminder letter in the mail one-two months in advance.  If you don't receive a letter, please call our office to schedule the follow up appointment

## 2015-10-07 ENCOUNTER — Other Ambulatory Visit: Payer: Self-pay | Admitting: Family Medicine

## 2015-11-06 ENCOUNTER — Other Ambulatory Visit: Payer: Self-pay | Admitting: Family Medicine

## 2016-01-01 ENCOUNTER — Other Ambulatory Visit: Payer: Self-pay | Admitting: *Deleted

## 2016-01-01 MED ORDER — ATORVASTATIN CALCIUM 80 MG PO TABS: 80.0000 mg | ORAL_TABLET | Freq: Every day | ORAL | Status: DC

## 2016-01-06 ENCOUNTER — Other Ambulatory Visit: Payer: Self-pay

## 2016-01-06 MED ORDER — LISINOPRIL 10 MG PO TABS: 10.0000 mg | ORAL_TABLET | Freq: Every day | ORAL | Status: DC

## 2016-01-28 ENCOUNTER — Encounter: Payer: Self-pay | Admitting: Vascular Surgery

## 2016-02-05 ENCOUNTER — Encounter: Payer: Medicare Other | Admitting: Vascular Surgery

## 2016-02-05 ENCOUNTER — Other Ambulatory Visit (HOSPITAL_COMMUNITY): Payer: Medicare Other

## 2016-02-05 NOTE — Progress Notes (Deleted)
    Established EVAR  History of Present Illness  Charles Medina is a 75 y.o. (08/08/1941) male who presents for routine follow up s/p EVAR (Date: 06/25/15).  Most recent EVAR duplex (Date: 02/05/2016) demonstrates: *** endoleak and *** sac size.  Most recent CTA (Date: 08/07/15) demonstrates: no endoleak and stable sac size.  The patient has *** had back or abdominal pain.  Pt has occluded B SFA.  The patient's PMH, PSH, SH, and FamHx are unchanged from 08/07/15.  Current Outpatient Prescriptions  Medication Sig Dispense Refill  . aspirin 81 MG tablet Take 81 mg by mouth daily.    Marland Kitchen atorvastatin (LIPITOR) 80 MG tablet Take 1 tablet (80 mg total) by mouth daily. 90 tablet 3  . glipiZIDE (GLUCOTROL) 5 MG tablet TAKE 1 TABLET BY MOUTH TWICE DAILY BEFORE A MEAL. 60 tablet 5  . lisinopril (PRINIVIL,ZESTRIL) 10 MG tablet Take 1 tablet (10 mg total) by mouth daily. 90 tablet 1  . metFORMIN (GLUCOPHAGE) 1000 MG tablet TAKE (1) TABLET BY MOUTH TWICE DAILY. 180 tablet 5  . traMADol (ULTRAM) 50 MG tablet Take 1 tablet (50 mg total) by mouth every 6 (six) hours as needed for moderate pain. 30 tablet 0   No current facility-administered medications for this visit.    No Known Allergies  On ROS today: ***, ***   Physical Examination  There were no vitals filed for this visit. There is no weight on file to calculate BMI.  General: A&O x 3, WD***, Obese, ***, Cachectic, ***, Ill appear, ***, Somulent,  Pulmonary: Sym exp, good air movt, CTAB, no rales, rhonchi, & wheezing***, + rales, ***, + rhonchi, ***, + wheezing,   Cardiac: RRR, Nl S1, S2, no Murmurs, rubs or gallops***, S3/S4, ***Irregularly, irregular rhythm and rate,  Vascular: Vessel Right Left  Radial ***Palpable ***Palpable  Brachial ***Palpable ***Palpable  Carotid ***Palpable, with***out bruit ***Palpable, with***out bruit  Aorta Not palpable N/A  Femoral ***Palpable ***Palpable  Popliteal Not palpable Not palpable  PT ***Palpable  ***Palpable  DP ***Palpable ***Palpable   Gastrointestinal: soft, NTND, no G/R, no HSM, no masses, no CVAT B***, + AAA , ***, Surg. Inc, ***TTP: ***, +G / +R,  Musculoskeletal: M/S 5/5 throughout *** except ***, Extremities without ischemic changes *** except  ***  Neurologic: Pain and light touch intact in extremities *** except ***, Motor exam as listed above  Non-Invasive Vascular Imaging  EVAR Duplex (02/05/2016)  AAA sac size: *** cm x *** cm  *** endoleak detected  CTA Abd/Pelvis Duplex (02/05/2016)  AAA sac size: *** cm x *** cm  *** endoleak detected   Medical Decision Making  Charles Medina is a 75 y.o. male who presents s/p EVAR.  Pt is ***symptomatic with *** sac size.  I discussed with the patient the importance of surveillance of the endograft.  The next endograft duplex will be scheduled for *** months.  The next CTA will be scheduled for *** months.  The patient will follow up with [place] in *** months with these studies.  Thank you for allowing [place] to participate in this patient's care.   Adele Barthel, MD [facility] Office: [phone] Pager: [phone]  02/05/2016, 9:08 AM

## 2016-02-08 NOTE — Progress Notes (Signed)
This encounter was created in error - please disregard.

## 2016-03-21 ENCOUNTER — Ambulatory Visit: Payer: Medicare Other | Admitting: Family Medicine

## 2016-04-05 ENCOUNTER — Other Ambulatory Visit: Payer: Self-pay | Admitting: Family Medicine

## 2016-04-06 ENCOUNTER — Other Ambulatory Visit: Payer: Self-pay | Admitting: *Deleted

## 2016-04-06 MED ORDER — LISINOPRIL 10 MG PO TABS: 10.0000 mg | ORAL_TABLET | Freq: Every day | ORAL | Status: DC

## 2016-04-06 MED ORDER — GLIPIZIDE 5 MG PO TABS
ORAL_TABLET | ORAL | Status: DC
Start: 2016-04-06 — End: 2016-06-01

## 2016-04-11 ENCOUNTER — Telehealth: Payer: Self-pay | Admitting: *Deleted

## 2016-04-11 ENCOUNTER — Encounter (HOSPITAL_COMMUNITY): Payer: Self-pay | Admitting: Emergency Medicine

## 2016-04-11 ENCOUNTER — Emergency Department (HOSPITAL_COMMUNITY)
Admission: EM | Admit: 2016-04-11 | Discharge: 2016-04-11 | Disposition: A | Discharge status: Home / Self Care | Payer: Medicare Other | Attending: Emergency Medicine | Admitting: Emergency Medicine

## 2016-04-11 DIAGNOSIS — B029 Zoster without complications: Secondary | ICD-10-CM | POA: Insufficient documentation

## 2016-04-11 DIAGNOSIS — Z7984 Long term (current) use of oral hypoglycemic drugs: Secondary | ICD-10-CM | POA: Diagnosis not present

## 2016-04-11 DIAGNOSIS — M1990 Unspecified osteoarthritis, unspecified site: Secondary | ICD-10-CM | POA: Diagnosis not present

## 2016-04-11 DIAGNOSIS — Z7982 Long term (current) use of aspirin: Secondary | ICD-10-CM | POA: Insufficient documentation

## 2016-04-11 DIAGNOSIS — Z79899 Other long term (current) drug therapy: Secondary | ICD-10-CM | POA: Insufficient documentation

## 2016-04-11 DIAGNOSIS — E119 Type 2 diabetes mellitus without complications: Secondary | ICD-10-CM | POA: Diagnosis not present

## 2016-04-11 DIAGNOSIS — Z87891 Personal history of nicotine dependence: Secondary | ICD-10-CM | POA: Insufficient documentation

## 2016-04-11 DIAGNOSIS — R1032 Left lower quadrant pain: Secondary | ICD-10-CM | POA: Diagnosis present

## 2016-04-11 DIAGNOSIS — I10 Essential (primary) hypertension: Secondary | ICD-10-CM | POA: Diagnosis not present

## 2016-04-11 LAB — BASIC METABOLIC PANEL
ANION GAP: 7 (ref 5–15)
BUN: 18 mg/dL (ref 6–20)
CO2: 27 mmol/L (ref 22–32)
Calcium: 9.7 mg/dL (ref 8.9–10.3)
Chloride: 104 mmol/L (ref 101–111)
Creatinine, Ser: 1.55 mg/dL — ABNORMAL HIGH (ref 0.61–1.24)
GFR, EST AFRICAN AMERICAN: 48 mL/min — AB (ref >60)
GFR, EST NON AFRICAN AMERICAN: 42 mL/min — AB (ref >60)
GLUCOSE: 64 mg/dL — AB (ref 65–99)
POTASSIUM: 4.5 mmol/L (ref 3.5–5.1)
Sodium: 138 mmol/L (ref 135–145)

## 2016-04-11 LAB — CBC WITH DIFFERENTIAL/PLATELET
BASOS ABS: 0 10*3/uL (ref 0.0–0.1)
BASOS PCT: 0 %
Eosinophils Absolute: 0.1 10*3/uL (ref 0.0–0.7)
Eosinophils Relative: 1 %
HEMATOCRIT: 42.5 % (ref 39.0–52.0)
Hemoglobin: 13.4 g/dL (ref 13.0–17.0)
LYMPHS PCT: 15 %
Lymphs Abs: 0.8 10*3/uL (ref 0.7–4.0)
MCH: 29.7 pg (ref 26.0–34.0)
MCHC: 31.5 g/dL (ref 30.0–36.0)
MCV: 94.2 fL (ref 78.0–100.0)
Monocytes Absolute: 0.4 10*3/uL (ref 0.1–1.0)
Monocytes Relative: 7 %
NEUTROS ABS: 4 10*3/uL (ref 1.7–7.7)
Neutrophils Relative %: 76 %
PLATELETS: 106 10*3/uL — AB (ref 150–400)
RBC: 4.51 MIL/uL (ref 4.22–5.81)
RDW: 14.1 % (ref 11.5–15.5)
WBC: 5.2 10*3/uL (ref 4.0–10.5)

## 2016-04-11 LAB — URINALYSIS, ROUTINE W REFLEX MICROSCOPIC
Bilirubin Urine: NEGATIVE
Glucose, UA: NEGATIVE mg/dL
Hgb urine dipstick: NEGATIVE
Ketones, ur: NEGATIVE mg/dL
LEUKOCYTES UA: NEGATIVE
NITRITE: NEGATIVE
PROTEIN: NEGATIVE mg/dL
Specific Gravity, Urine: >1.03 — ABNORMAL HIGH (ref 1.005–1.030)
pH: 5.5 (ref 5.0–8.0)

## 2016-04-11 LAB — CBG MONITORING, ED: Glucose-Capillary: 174 mg/dL — ABNORMAL HIGH (ref 65–99)

## 2016-04-11 MED ORDER — ACYCLOVIR 400 MG PO TABS: 400.0000 mg | ORAL_TABLET | Freq: Every day | ORAL | Status: DC

## 2016-04-11 MED ORDER — HYDROCODONE-ACETAMINOPHEN 5-325 MG PO TABS: 1.0000 | ORAL_TABLET | Freq: Four times a day (QID) | ORAL | Status: DC | PRN

## 2016-04-11 NOTE — ED Notes (Signed)
MD went into room and d/c pt prior to assessment. See EDP documentation

## 2016-04-11 NOTE — Telephone Encounter (Signed)
Patient called with c/o left lower side pain for 2-3 days and vomiting times one. Consult with Dr Nicki Reaper. Dr Nicki Reaper advises patient to go directly to ER for eval and treatment. Patient verbalized understanding and agreed to go to the hospital.

## 2016-04-11 NOTE — ED Provider Notes (Signed)
CSN: [id number]     Arrival date & time 04/11/16  1514 History   First MD Initiated Contact with Patient 04/11/16 1920     Chief Complaint  Patient presents with  . Abdominal Pain     (Consider location/radiation/quality/duration/timing/severity/associated sxs/prior Treatment) Patient is a 75 y.o. male presenting with abdominal pain. The history is provided by the patient (Patient complains of left lower quadrant abdominal pain with the rash. This is been going on for couple days.).  Abdominal Pain Pain location:  LLQ Pain quality: aching   Pain radiates to:  Does not radiate Pain severity:  Moderate Onset quality:  Gradual Timing:  Constant Progression:  Unchanged Chronicity:  New Context: not alcohol use   Associated symptoms: no chest pain, no cough, no diarrhea, no fatigue and no hematuria     Past Medical History  Diagnosis Date  . Hypertension   . Diabetes mellitus without complication ([place])   . Arthritis   . Prostate cancer ([place])   . Type 2 diabetes mellitus ([place])   . AAA (abdominal aortic aneurysm) [facility])    Past Surgical History  Procedure Laterality Date  . Prostatectomy  2011  . Colonoscopy  11/14/2012    Procedure: COLONOSCOPY;  Surgeon: Rogene Houston, MD;  [facility];  Service: Endoscopy;  Laterality: N/A;  830  . Abdominal aortic endovascular stent graft N/A 06/25/2015    Procedure: ABDOMINAL AORTIC ENDOVASCULAR STENT GRAFT;  Surgeon: Conrad [place], MD;  Location: [facility] OR;  Service: Vascular;  Laterality: N/A;   Family History  Problem Relation Age of Onset  . Family history unknown: Yes   Social History  Substance Use Topics  . Smoking status: Former Smoker -- 0.50 packs/day for 23 years    Types: Cigarettes    Start date: 05/08/1956    Quit date: 11/15/1971  . Smokeless tobacco: Never Used  . Alcohol Use: No    Review of Systems  Constitutional: Negative for appetite change and fatigue.  HENT: Negative for congestion, ear discharge and  sinus pressure.   Eyes: Negative for discharge.  Respiratory: Negative for cough.   Cardiovascular: Negative for chest pain.  Gastrointestinal: Positive for abdominal pain. Negative for diarrhea.  Genitourinary: Negative for frequency and hematuria.  Musculoskeletal: Negative for back pain.  Skin: Positive for rash.  Neurological: Negative for seizures and headaches.  Psychiatric/Behavioral: Negative for hallucinations.  0    Allergies  Review of patient's allergies indicates no known allergies.  Home Medications   Prior to Admission medications   Medication Sig Start Date End Date Taking? Authorizing Provider  aspirin 81 MG tablet Take 81 mg by mouth daily.   Yes Historical Provider, MD  atorvastatin (LIPITOR) 80 MG tablet Take 1 tablet (80 mg total) by mouth daily. 01/01/16  Yes Arnoldo Lenis, MD  glipiZIDE (GLUCOTROL) 5 MG tablet TAKE 1 TABLET BY MOUTH TWICE DAILY BEFORE A MEAL. 04/06/16  Yes Kathyrn Drown, MD  lisinopril (PRINIVIL,ZESTRIL) 10 MG tablet Take 1 tablet (10 mg total) by mouth daily. 04/06/16  Yes Kathyrn Drown, MD  metFORMIN (GLUCOPHAGE) 1000 MG tablet TAKE (1) TABLET BY MOUTH TWICE DAILY. 04/05/16  Yes Kathyrn Drown, MD  acyclovir (ZOVIRAX) 400 MG tablet Take 1 tablet (400 mg total) by mouth 5 (five) times daily. 04/11/16   Milton Ferguson, MD  HYDROcodone-acetaminophen (NORCO/VICODIN) 5-325 MG tablet Take 1 tablet by mouth every 6 (six) hours as needed. 04/11/16   Milton Ferguson, MD   BP 170/83 mmHg  Pulse 88  Temp(Src) 98.7 F (37.1 C) (Oral)  Resp 18  Ht 5\' 11"  (1.803 m)  Wt 172 lb (78.019 kg)  BMI 24.00 kg/m2  SpO2 100% Physical Exam  Constitutional: He is oriented to person, place, and time. He appears well-developed.  HENT:  Head: Normocephalic.  Eyes: Conjunctivae and EOM are normal. No scleral icterus.  Neck: Neck supple. No thyromegaly present.  Cardiovascular: Normal rate and regular rhythm.  Exam reveals no gallop and no friction rub.   No murmur  heard. Pulmonary/Chest: No stridor. He has no wheezes. He has no rales. He exhibits no tenderness.  Abdominal: He exhibits no distension. There is no tenderness. There is no rebound.  Musculoskeletal: Normal range of motion. He exhibits no edema.  Lymphadenopathy:    He has no cervical adenopathy.  Neurological: He is oriented to person, place, and time. He exhibits normal muscle tone. Coordination normal.  Skin: Rash noted. No erythema.  Patient has a vesicular rash to his left lower quadrant and going into his back  Psychiatric: He has a normal mood and affect. His behavior is normal.    ED Course  Procedures (including critical care time) Labs Review Labs Reviewed  URINALYSIS, ROUTINE W REFLEX MICROSCOPIC (NOT AT [facility]) - Abnormal; Notable for the following:    Specific Gravity, Urine >1.030 (*)    All other components within normal limits  CBC WITH DIFFERENTIAL/PLATELET - Abnormal; Notable for the following:    Platelets 106 (*)    All other components within normal limits  BASIC METABOLIC PANEL - Abnormal; Notable for the following:    Glucose, Bld 64 (*)    Creatinine, Ser 1.55 (*)    GFR calc non Af Amer 42 (*)    GFR calc Af Amer 48 (*)    All other components within normal limits  CBG MONITORING, ED - Abnormal; Notable for the following:    Glucose-Capillary 174 (*)    All other components within normal limits    Imaging Review No results found. I have personally reviewed and evaluated these images and lab results as part of my medical decision-making.   EKG Interpretation None      MDM   Final diagnoses:  Shingles    Patient with shingles. Patient will be treated with acyclovir and hydrocodone   Milton Ferguson, MD 04/11/16 2014

## 2016-04-11 NOTE — Discharge Instructions (Signed)
Follow up with your md next week. °

## 2016-04-11 NOTE — ED Notes (Signed)
Pt c/o intermittent llq abd pain and nausea x 2 weeks.

## 2016-04-26 ENCOUNTER — Other Ambulatory Visit: Payer: Self-pay | Admitting: Family Medicine

## 2016-04-26 NOTE — Telephone Encounter (Signed)
May give a refill regarding this medicine I also believe it is in the patient's best interest to follow-up somewhere within the next 10 days

## 2016-05-11 ENCOUNTER — Other Ambulatory Visit: Payer: Self-pay | Admitting: Family Medicine

## 2016-06-01 ENCOUNTER — Other Ambulatory Visit: Payer: Self-pay | Admitting: Family Medicine

## 2016-06-02 ENCOUNTER — Emergency Department (HOSPITAL_COMMUNITY): Payer: Medicare Other

## 2016-06-02 ENCOUNTER — Emergency Department (HOSPITAL_COMMUNITY)
Admission: EM | Admit: 2016-06-02 | Discharge: 2016-06-02 | Disposition: A | Discharge status: Home / Self Care | Payer: Medicare Other | Attending: Emergency Medicine | Admitting: Emergency Medicine

## 2016-06-02 ENCOUNTER — Encounter (HOSPITAL_COMMUNITY): Payer: Self-pay | Admitting: Emergency Medicine

## 2016-06-02 DIAGNOSIS — Z87891 Personal history of nicotine dependence: Secondary | ICD-10-CM | POA: Diagnosis not present

## 2016-06-02 DIAGNOSIS — Z79899 Other long term (current) drug therapy: Secondary | ICD-10-CM | POA: Diagnosis not present

## 2016-06-02 DIAGNOSIS — R059 Cough, unspecified: Secondary | ICD-10-CM

## 2016-06-02 DIAGNOSIS — E119 Type 2 diabetes mellitus without complications: Secondary | ICD-10-CM | POA: Diagnosis not present

## 2016-06-02 DIAGNOSIS — E785 Hyperlipidemia, unspecified: Secondary | ICD-10-CM | POA: Insufficient documentation

## 2016-06-02 DIAGNOSIS — Z8546 Personal history of malignant neoplasm of prostate: Secondary | ICD-10-CM | POA: Diagnosis not present

## 2016-06-02 DIAGNOSIS — Z7984 Long term (current) use of oral hypoglycemic drugs: Secondary | ICD-10-CM | POA: Diagnosis not present

## 2016-06-02 DIAGNOSIS — J9801 Acute bronchospasm: Secondary | ICD-10-CM | POA: Diagnosis not present

## 2016-06-02 DIAGNOSIS — R05 Cough: Secondary | ICD-10-CM

## 2016-06-02 DIAGNOSIS — I10 Essential (primary) hypertension: Secondary | ICD-10-CM | POA: Insufficient documentation

## 2016-06-02 MED ORDER — ALBUTEROL SULFATE HFA 108 (90 BASE) MCG/ACT IN AERS
1.0000 | INHALATION_SPRAY | RESPIRATORY_TRACT | Status: DC
  Administered 2016-06-02: 2 via RESPIRATORY_TRACT
  Filled 2016-06-02: qty 6.7

## 2016-06-02 MED ORDER — AEROCHAMBER PLUS W/MASK MISC
1.0000 | Freq: Once | Status: AC
  Administered 2016-06-02: 1
  Filled 2016-06-02: qty 1

## 2016-06-02 MED ORDER — DEXTROMETHORPHAN-GUAIFENESIN 20-400 MG PO TABS: 1.0000 | ORAL_TABLET | ORAL | 0 refills | Status: DC | PRN

## 2016-06-02 MED ORDER — AZITHROMYCIN 250 MG PO TABS: ORAL_TABLET | ORAL | 0 refills | Status: DC

## 2016-06-02 MED ORDER — PREDNISONE 20 MG PO TABS: 40.0000 mg | ORAL_TABLET | Freq: Every day | ORAL | 0 refills | Status: DC

## 2016-06-02 NOTE — ED Provider Notes (Signed)
[facility] DEPT Provider Note   CSN: [id number] Arrival date & time: 06/02/16  1045  First Provider Contact:  First MD Initiated Contact with Patient 06/02/16 1114        History   Chief Complaint Chief Complaint  Patient presents with  . Cough    HPI Charles Medina is a 75 y.o. male presents emergency Department with chief complaint of cough. Onset 2-1/2 weeks ago. His productive of sputum. He has paroxysms of severe coughing fits with associated posttussive emesis. Denies fevers, chills, other symptoms of URI. He has a history of diabetes and vascular disease. He has no history of COPD or smoking. Denies fevers, chills, myalgias, arthralgias. Denies DOE, SOB, chest tightness or pressure, radiation to left arm, jaw or back, or diaphoresis. Denies dysuria, flank pain, suprapubic pain, frequency, urgency, or hematuria. Denies headaches, light headedness, weakness, visual disturbances. Denies abdominal pain, nausea, diarrhea or constipation.   HPI  Past Medical History:  Diagnosis Date  . AAA (abdominal aortic aneurysm) ([place])   . Arthritis   . Diabetes mellitus without complication ([place])   . Hypertension   . Prostate cancer ([place])   . Type 2 diabetes mellitus [facility])     Patient Active Problem List   Diagnosis Date Noted  . AAA (abdominal aortic aneurysm) ([place]) 06/25/2015  . AAA (abdominal aortic aneurysm) without rupture ([place]) 06/12/2015  . Prostate cancer ([place]) 06/03/2015  . Type 2 diabetes mellitus ([place]) 03/19/2015  . History of prostate cancer 03/19/2015  . Hallux valgus with bunions 03/19/2015  . Type II or unspecified type diabetes mellitus without mention of complication, uncontrolled 01/28/2013  . Hyperlipidemia 01/28/2013  . Hypertension 01/28/2013    Past Surgical History:  Procedure Laterality Date  . ABDOMINAL AORTIC ENDOVASCULAR STENT GRAFT N/A 06/25/2015   Procedure: ABDOMINAL AORTIC ENDOVASCULAR STENT GRAFT;  Surgeon: Conrad [place], MD;  Location: [place];   Service: Vascular;  Laterality: N/A;  . COLONOSCOPY  11/14/2012   Procedure: COLONOSCOPY;  Surgeon: Rogene [place], MD;  [facility];  Service: Endoscopy;  Laterality: N/A;  830  . PROSTATECTOMY  2011       Home Medications    Prior to Admission medications   Medication Sig Start Date End Date Taking? Authorizing Provider  acyclovir (ZOVIRAX) 400 MG tablet TAKE 1 TABLET 5 TIMES DAILY. 04/27/16   Kathyrn Drown, MD  aspirin 81 MG tablet Take 81 mg by mouth daily.    Historical Provider, MD  atorvastatin (LIPITOR) 80 MG tablet Take 1 tablet (80 mg total) by mouth daily. 01/01/16   Arnoldo Lenis, MD  glipiZIDE (GLUCOTROL) 5 MG tablet TAKE 1 TABLET BY MOUTH TWICE DAILY BEFORE A MEAL. 06/01/16   Kathyrn Drown, MD  HYDROcodone-acetaminophen (NORCO/VICODIN) 5-325 MG tablet Take 1 tablet by mouth every 6 (six) hours as needed. 04/11/16   Milton Ferguson, MD  lisinopril (PRINIVIL,ZESTRIL) 10 MG tablet TAKE ONE TABLET BY MOUTH ONCE DAILY. 06/01/16   Kathyrn Drown, MD  metFORMIN (GLUCOPHAGE) 1000 MG tablet TAKE (1) TABLET BY MOUTH TWICE DAILY. 05/11/16   Kathyrn Drown, MD    Family History Family History  Problem Relation Age of Onset  . Family history unknown: Yes    Social History Social History  Substance Use Topics  . Smoking status: Former Smoker    Packs/day: 0.50    Years: 23.00    Types: Cigarettes    Start date: 05/08/1956    Quit date: 11/15/1971  . Smokeless tobacco:  Never Used  . Alcohol use No     Allergies   Review of patient's allergies indicates no known allergies.   Review of Systems Review of Systems  Ten systems reviewed and are negative for acute change, except as noted in the HPI.    Physical Exam Updated Vital Signs BP 160/65 (BP Location: Left Arm)   Pulse 76   Temp 98.3 F (36.8 C) (Oral)   Resp 18   Ht 5\' 11"  (1.803 m)   Wt 81.2 kg   SpO2 100%   BMI 24.97 kg/m   Physical Exam  Constitutional: He appears well-developed and  well-nourished. No distress.  HENT:  Head: Normocephalic and atraumatic.  Eyes: Conjunctivae are normal. No scleral icterus.  Neck: Normal range of motion. Neck supple.  Cardiovascular: Normal rate, regular rhythm and normal heart sounds.   Pulmonary/Chest: Effort normal and breath sounds normal. No respiratory distress. He has no wheezes.  Abdominal: Soft. There is no tenderness.  Musculoskeletal: He exhibits no edema.  Neurological: He is alert.  Skin: Skin is warm and dry. He is not diaphoretic.  Psychiatric: His behavior is normal.  Nursing note and vitals reviewed.    ED Treatments / Results  Labs (all labs ordered are listed, but only abnormal results are displayed) Labs Reviewed  BORDETELLA PERTUSSIS PCR    EKG  EKG Interpretation None       Radiology Dg Chest 2 View  Result Date: 06/02/2016 CLINICAL DATA:  Cough, 1 week duration. Occasionally productive of clear sputum. EXAM: CHEST  2 VIEW COMPARISON:  06/25/2015 FINDINGS: Heart size is normal. Mediastinal shadows are unremarkable except for aortic atherosclerosis. The lungs are clear. The vascularity is normal. No effusions. No significant bone finding. IMPRESSION: No active disease.  Aortic atherosclerosis. Electronically Signed   By: Nelson Chimes M.D.   On: 06/02/2016 11:36   Procedures Procedures (including critical care time)  Medications Ordered in ED Medications - No data to display   Initial Impression / Assessment and Plan / ED Course  I have reviewed the triage vital signs and the nursing notes.  Pertinent labs & imaging results that were available during my care of the patient were reviewed by me and considered in my medical decision making (see chart for details).  Clinical Course    11:55 AM BP 160/65 (BP Location: Left Arm)   Pulse 76   Temp 98.3 F (36.8 C) (Oral)   Resp 18   Ht 5\' 11"  (1.803 m)   Wt 81.2 kg   SpO2 100%   BMI 24.97 kg/m  Patient with productive cough.  Will check  pertussis. The patient will be given zpak, inhaler and prednisone burst. He is to follow up with his pcp for ED follow up within the week or sooner if sxs worse. Discussed return precautions. Patient seen in shared visit with attending physician.   Final Clinical Impressions(s) / ED Diagnoses   Final diagnoses:  Cough  Bronchospasm    New Prescriptions New Prescriptions   No medications on file     Margarita Mail, PA-C 06/02/16 [place], MD 06/02/16 2047

## 2016-06-02 NOTE — Discharge Instructions (Signed)
Take the medications as directed.  You may use the inhaler, 1-2 puffs  every 4-6 hours. Return for worening symptoms. Call your doctor for a follow up appointment.

## 2016-06-02 NOTE — ED Triage Notes (Signed)
Pt reports cough x 1 week. Productive at times with clear sputum. Pt denies fever.

## 2016-06-02 NOTE — Progress Notes (Signed)
Pt given Albuterol inhaler along with Aerochamber. Teach back method done, pt able to demonstrate correct usage of inhaler with and without chamber. RT explained importance of using chamber with inhaler. RT explained dosage/usage of inhaler. Pt/family member understand. RT will continue to monitor.

## 2016-06-21 ENCOUNTER — Encounter: Payer: Self-pay | Admitting: Family Medicine

## 2016-06-21 ENCOUNTER — Ambulatory Visit (INDEPENDENT_AMBULATORY_CARE_PROVIDER_SITE_OTHER): Payer: Medicare Other | Admitting: Family Medicine

## 2016-06-21 ENCOUNTER — Other Ambulatory Visit: Payer: Self-pay | Admitting: Family Medicine

## 2016-06-21 VITALS — BP 138/82 | Ht 68.5 in | Wt 150.0 lb

## 2016-06-21 DIAGNOSIS — M2010 Hallux valgus (acquired), unspecified foot: Secondary | ICD-10-CM

## 2016-06-21 DIAGNOSIS — Z79899 Other long term (current) drug therapy: Secondary | ICD-10-CM

## 2016-06-21 DIAGNOSIS — M21619 Bunion of unspecified foot: Secondary | ICD-10-CM

## 2016-06-21 DIAGNOSIS — Z8546 Personal history of malignant neoplasm of prostate: Secondary | ICD-10-CM

## 2016-06-21 DIAGNOSIS — E785 Hyperlipidemia, unspecified: Secondary | ICD-10-CM | POA: Diagnosis not present

## 2016-06-21 DIAGNOSIS — E119 Type 2 diabetes mellitus without complications: Secondary | ICD-10-CM

## 2016-06-21 DIAGNOSIS — E0841 Diabetes mellitus due to underlying condition with diabetic mononeuropathy: Secondary | ICD-10-CM | POA: Diagnosis not present

## 2016-06-21 DIAGNOSIS — I10 Essential (primary) hypertension: Secondary | ICD-10-CM

## 2016-06-21 LAB — POCT GLYCOSYLATED HEMOGLOBIN (HGB A1C): HEMOGLOBIN A1C: 5.8

## 2016-06-21 NOTE — Progress Notes (Signed)
   Subjective:    Patient ID: Charles Medina, male    DOB: Jan 06, 1941, 75 y.o.   MRN: [id number]  Diabetes  He presents for his follow-up diabetic visit. He has type 2 diabetes mellitus. Pertinent negatives for hypoglycemia include no confusion. Pertinent negatives for diabetes include no chest pain, no fatigue, no polydipsia, no polyphagia and no weakness. He is compliant with treatment all of the time. He is following a generally healthy diet. He participates in exercise three times a week. Home blood sugar record trend: readings between 85-90. He does not see a podiatrist.Eye exam is not current.   Pt wants script for diabetic shoes Patient has a repair of the lower aortic aneurysm overall this is doing well denies any abdominal pain rectal bleeding Occasionally gets discomfort for which he has to use hydrocodone but rarely Has blood pressure issues takes medicine on a regular basis denies any problems with this watching diet staying physically active Patient does take his cholesterol medicine on a regular basis. He does complain of numbness and burning into his feet   Review of Systems  Constitutional: Negative for activity change, appetite change and fatigue.  HENT: Negative for congestion.   Respiratory: Negative for cough.   Cardiovascular: Negative for chest pain.  Gastrointestinal: Negative for abdominal pain.  Endocrine: Negative for polydipsia and polyphagia.  Neurological: Negative for weakness.  Psychiatric/Behavioral: Negative for confusion.       Objective:   Physical Exam  Constitutional: He appears well-nourished. No distress.  Cardiovascular: Normal rate, regular rhythm and normal heart sounds.   No murmur heard. Pulmonary/Chest: Effort normal and breath sounds normal. No respiratory distress.  Musculoskeletal: He exhibits no edema.  Lymphadenopathy:    He has no cervical adenopathy.  Neurological: He is alert.  Psychiatric: His behavior is normal.  Vitals  reviewed.   Patient has a history of prostate cancer needs a follow-up PSA Diabetic foot exam reveals abnormalities bunions hammertoes and mild neuropathy     Assessment & Plan:  Type 2 diabetes under good control stop glipizide. Cholesterol good control watch diet recent labs reviewed check additional labs continue medication Blood pressure good control continue current medications watch diet stay active Diabetic neuropathy with foot deformity and referral for diabetic shoes History prostate cancer check PSA Follow-up within 4-6 months

## 2016-06-24 DIAGNOSIS — E119 Type 2 diabetes mellitus without complications: Secondary | ICD-10-CM | POA: Diagnosis not present

## 2016-06-24 DIAGNOSIS — E785 Hyperlipidemia, unspecified: Secondary | ICD-10-CM | POA: Diagnosis not present

## 2016-06-24 DIAGNOSIS — Z8546 Personal history of malignant neoplasm of prostate: Secondary | ICD-10-CM | POA: Diagnosis not present

## 2016-06-24 DIAGNOSIS — Z79899 Other long term (current) drug therapy: Secondary | ICD-10-CM | POA: Diagnosis not present

## 2016-06-24 DIAGNOSIS — I10 Essential (primary) hypertension: Secondary | ICD-10-CM | POA: Diagnosis not present

## 2016-06-25 LAB — LIPID PANEL
CHOL/HDL RATIO: 4.7 ratio (ref 0.0–5.0)
Cholesterol, Total: 186 mg/dL (ref 100–199)
HDL: 40 mg/dL (ref >39)
LDL CALC: 137 mg/dL — AB (ref 0–99)
TRIGLYCERIDES: 47 mg/dL (ref 0–149)
VLDL CHOLESTEROL CAL: 9 mg/dL (ref 5–40)

## 2016-06-25 LAB — BASIC METABOLIC PANEL
BUN/Creatinine Ratio: 14 (ref 10–24)
BUN: 15 mg/dL (ref 8–27)
CALCIUM: 9.4 mg/dL (ref 8.6–10.2)
CO2: 25 mmol/L (ref 18–29)
Chloride: 106 mmol/L (ref 96–106)
Creatinine, Ser: 1.04 mg/dL (ref 0.76–1.27)
GFR calc non Af Amer: 69 mL/min/{1.73_m2} (ref >59)
GFR, EST AFRICAN AMERICAN: 80 mL/min/{1.73_m2} (ref >59)
GLUCOSE: 77 mg/dL (ref 65–99)
Potassium: 5.2 mmol/L (ref 3.5–5.2)
Sodium: 144 mmol/L (ref 134–144)

## 2016-06-25 LAB — HEPATIC FUNCTION PANEL
ALT: 15 IU/L (ref 0–44)
AST: 17 IU/L (ref 0–40)
Albumin: 4.2 g/dL (ref 3.5–4.8)
Alkaline Phosphatase: 82 IU/L (ref 39–117)
BILIRUBIN TOTAL: 0.2 mg/dL (ref 0.0–1.2)
Bilirubin, Direct: 0.06 mg/dL (ref 0.00–0.40)
Total Protein: 6.7 g/dL (ref 6.0–8.5)

## 2016-06-25 LAB — MICROALBUMIN / CREATININE URINE RATIO
Creatinine, Urine: 304.3 mg/dL
MICROALB/CREAT RATIO: 19.4 mg/g{creat} (ref 0.0–30.0)
MICROALBUM., U, RANDOM: 59.1 ug/mL

## 2016-06-25 LAB — PSA: Prostate Specific Ag, Serum: 0.1 ng/mL (ref 0.0–4.0)

## 2016-06-26 ENCOUNTER — Encounter: Payer: Self-pay | Admitting: Family Medicine

## 2016-07-04 ENCOUNTER — Other Ambulatory Visit: Payer: Self-pay | Admitting: Family Medicine

## 2016-08-11 ENCOUNTER — Ambulatory Visit (INDEPENDENT_AMBULATORY_CARE_PROVIDER_SITE_OTHER): Payer: Commercial Managed Care - HMO | Admitting: *Deleted

## 2016-08-11 DIAGNOSIS — Z23 Encounter for immunization: Secondary | ICD-10-CM | POA: Diagnosis not present

## 2016-09-29 IMAGING — CT CT CTA ABD/PEL W/CM AND/OR W/O CM
1 of 7 series · 10 of 36 positions shown, 15 images · IV contrast (75CC ISOVUE 300)
Comparison: None.

CLINICAL DATA: Abdominal aortic aneurysm

EXAM:
CT ANGIOGRAPHY ABDOMEN AND PELVIS WITH CONTRAST
TECHNIQUE: Multidetector CT imaging of the abdomen and pelvis was performed
using the standard protocol during bolus administration of
intravenous contrast. Multiplanar reconstructed images including
MIPs were obtained and reviewed to evaluate the vascular anatomy.
CONTRAST:  75 cc Isovue 370

[Series 4: angio (id) · axial · 0.68mm/px · z∈[-413,-8]mm · 10 of 189 slices shown, 15 images]
[im 14/189  soft-tissue]
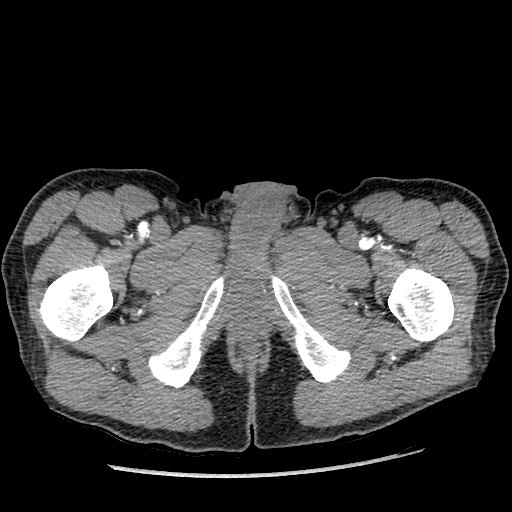
[im 14/189  bone]
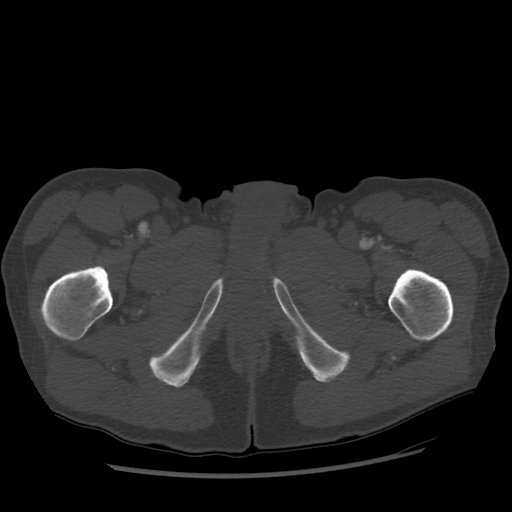
[im 41/189  soft-tissue]
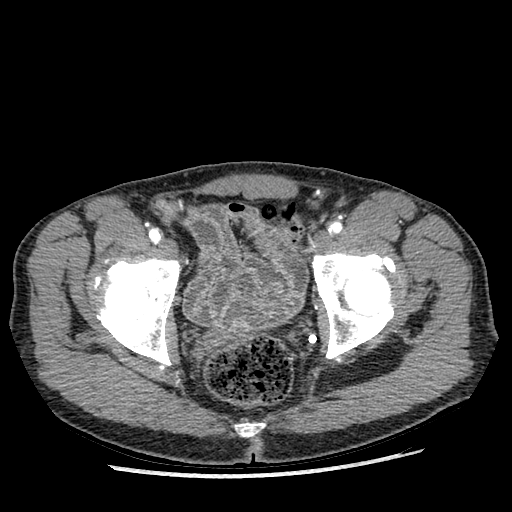
[im 54/189  soft-tissue]
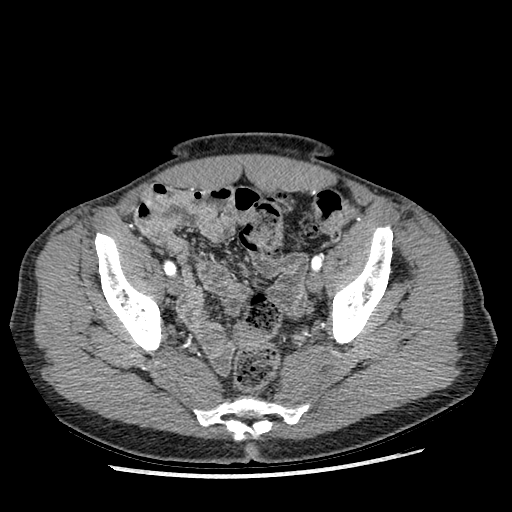
[im 81/189  soft-tissue]
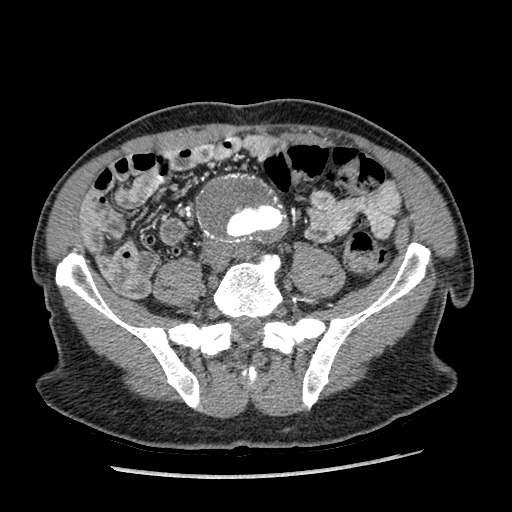
[im 95/189  soft-tissue]
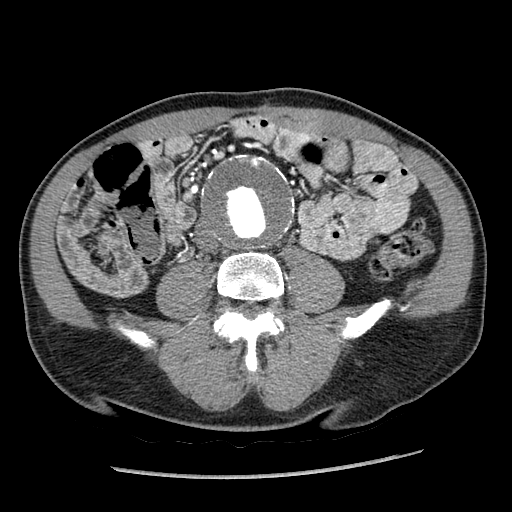
[im 108/189  soft-tissue]
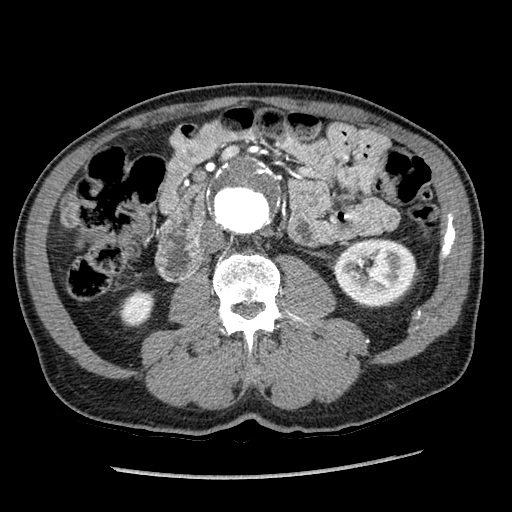
[im 135/189  soft-tissue]
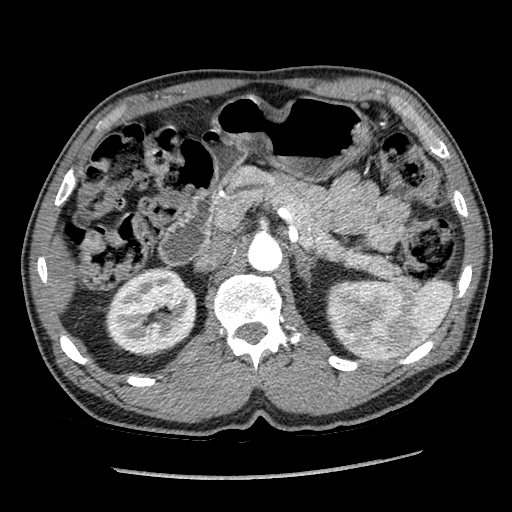
[im 135/189  lung]
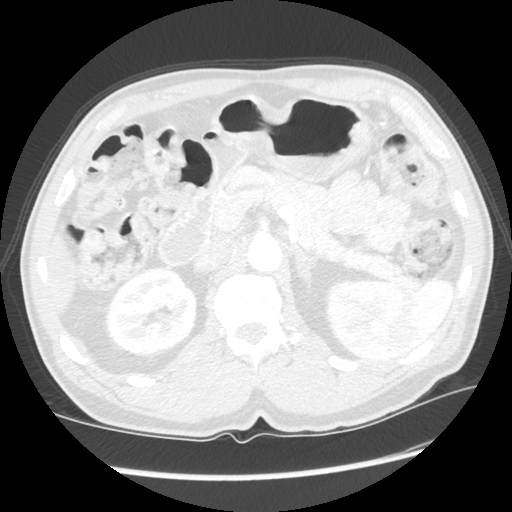
[im 148/189  soft-tissue]
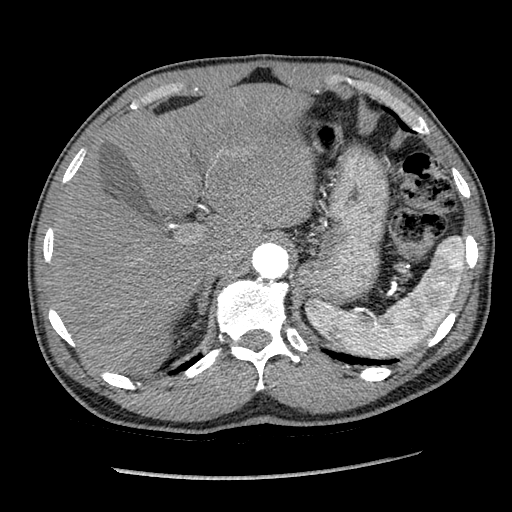
[im 148/189  lung]
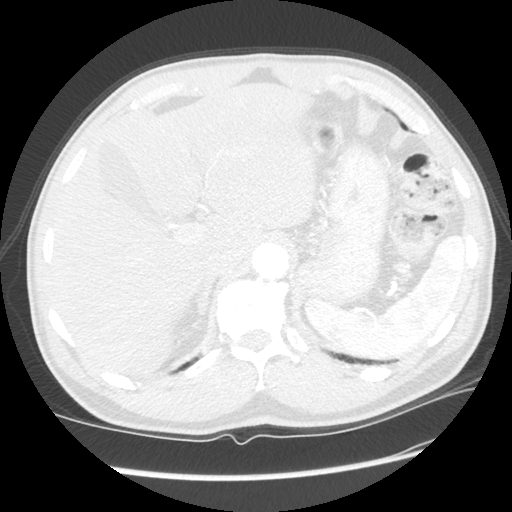
[im 162/189  lung]
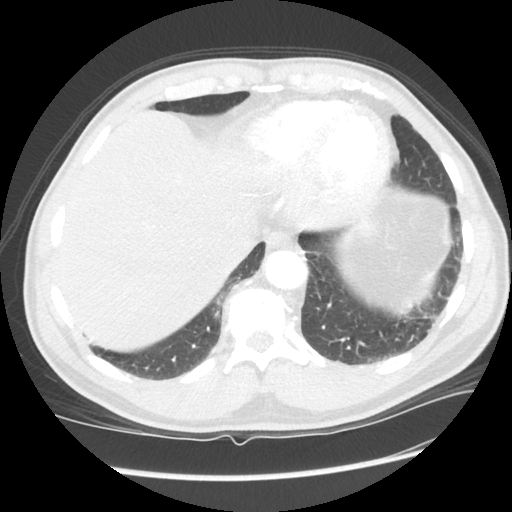
[im 175/189  soft-tissue]
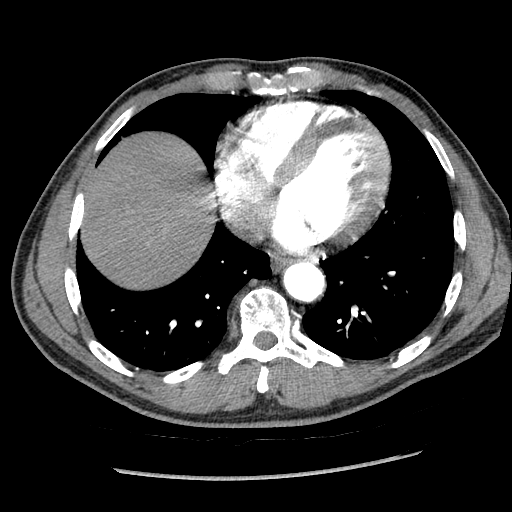
[im 175/189  lung]
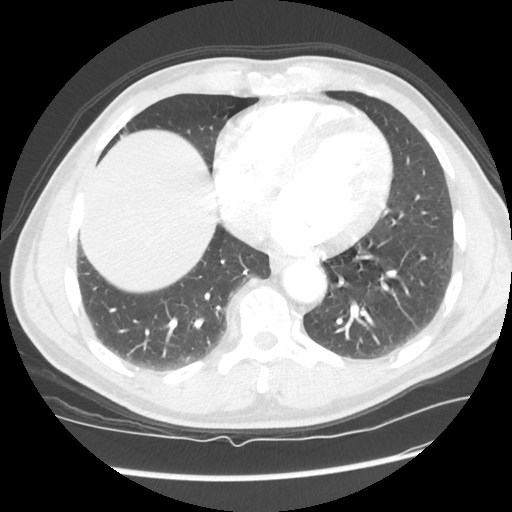
[im 175/189  bone]
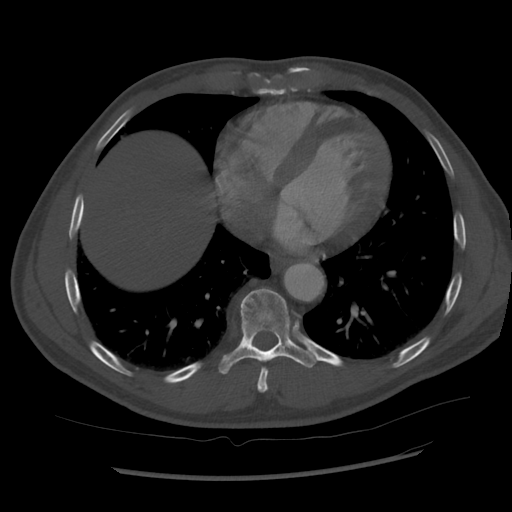

[10 of 36 positions shown; findings below may reference images not displayed]

FINDINGS: Vascular Findings:

Abdominal aorta: There is a large infrarenal abdominal AA aortic
aneurysm which measures approximately 6.1 x 6.7 x 6.3 cm (as
measured in greatest maximal oblique axial - image 100, series 4,
coronal - image 46, series 601 and sagittal -image 68, series 602)
dimensions respectively.

The aneurysm originates approximate 3.7 cm caudal to the take-off of
the most inferior left renal artery just caudal to the take-off of
the still patent IMA and extends to asymmetrically involve the
origin and proximal aspect of the right common iliac artery.

There is a large amount of eccentric irregular largely non calcified
mural thrombus within the dominant aneurysmal component of the
aneurysm. Additionally, there is crescentic calcified plaque within
the anterior aspect of the aneurysm (image 86, series 4). While the
patent lumen of the abdominal aorta is noted to be slightly
irregular (representative images L 91 and 93, series 4), there is no
evidence of abdominal aortic dissection. No periaortic stranding.

Celiac artery: There is a minimal amount of eccentric mixed
calcified and noncalcified atherosclerotic plaque involving the
origin of the celiac artery which approaches 50% luminal narrowing
(sagittal image 75, series 602). Conventional branching pattern.

SMA: There is a minimal amount of eccentric mixed calcified and
noncalcified atherosclerotic plaque involving the origin of the SMA,
not resulting in hemodynamically significant stenosis. Conventional
branching pattern. The distal tributaries the SMA appear widely
patent without discrete intraluminal filling defect to suggest
distal embolism.

Right Renal artery: Duplicated; note is made of nearly co-dominant
duplicated right-sided renal arteries. Both right-sided renal
arteries appear patent without hemodynamically significant
narrowing. No downstream vessel irregularity to suggest FMD.

Left Renal artery: Duplicated; there is a tiny accessory left renal
artery which supplies the superior pole of the left kidney. There is
a minimal amount of eccentric mixed calcified and noncalcified
atherosclerotic plaque involving the origin of the dominant left
renal artery, not definitely resulting in a hemodynamically
significant narrowing. No vessel irregularity to suggest FMD.

IMA: Remains patent immediately caudal to the origin of the large
infrarenal abdominal aortic aneurysm.

Right-sided pelvic vasculature: The infrarenal aortic aneurysm
extends to asymmetrically involve the origin and proximal aspects of
the right common iliac artery which tapers distally prior to its
bifurcation. The right common iliac artery measures approximately
2.7 cm in greatest oblique coronal diameter (image 50, series 601).
The right external iliac artery is widely patent without
hemodynamically significant narrowing. The right internal iliac
artery is diseased though patent and of normal caliber.

Left-sided pelvic vasculature: There is no extension of the
infrarenal abdominal aortic aneurysm to involve the left common
iliac artery, however there is a moderate amount of eccentric mixed
calcified and noncalcified atherosclerotic plaque involving the
origin of the left common iliac artery, likely resulting in 50%
luminal narrowing (coronal image 47, series 601). There is a
moderate amount of eccentric mixed calcified and noncalcified
atherosclerotic plaque scattered throughout the remainder of the
left common and external iliac arteries, not definitely resulting in
hemodynamically significant stenosis. The left internal iliac artery
is diseased though patent and of normal caliber.

Review of the MIP images confirms the above findings.

--------------------------------------------------------------------------------

Nonvascular Findings:

Evaluation of the abdominal organs is limited to the arterial phase
of enhancement.

Normal hepatic contour. No discrete hyper enhancing hepatic lesions.
Normal appearance of the gallbladder given degree distention. No
radiopaque gallstones. No intra or extrahepatic biliary duct
dilatation. No ascites.

There is symmetric enhancement and excretion of the bilateral
kidneys. No definite renal stones in this postcontrast examination.
No discrete renal lesions. No urine obstruction or perinephric
stranding.

There is mild thickening of the crux of the left adrenal gland
without discrete nodule. Normal appearance of the right adrenal
gland, pancreas and spleen.

Moderate to large colonic stool burden. Scattered colonic
diverticulosis without evidence of diverticulitis. Normal appearance
of the appendix. No pneumoperitoneum, pneumatosis or portal venous
gas.

No bulky retroperitoneal, mesenteric, pelvic or inguinal
lymphadenopathy. No free fluid in the pelvic cul-de-sac.

Limited visualization of the lower thorax demonstrates minimal
dependent subpleural ground-glass atelectasis. No discrete focal
airspace opacity. No pleural effusion.

Normal heart size.  Coronary artery calcifications.

No acute or aggressive osseous abnormalities. Mild degenerative
change of the bilateral hips. Os acetabuli are noted bilaterally.
Tiny mesenteric fat containing periumbilical hernia.
IMPRESSION: Vascular Impression:

1. Large infrarenal abdominal aortic aneurysm measuring
approximately 6.7 cm in maximal diameter extends to asymmetrically
involve the origin and proximal aspect of the right common iliac
artery. There is a large amount of mixed calcified and largely
noncalcified irregular mural thrombus within the dominant aneurysmal
component without evidence of abdominal aortic dissection or
periaortic stranding.
2. Suspected hemodynamically significant stenoses involving the
origin of the celiac and left common iliac arteries.
Nonvascular Impression:

1. Colonic diverticulosis without evidence of diverticulitis.

## 2016-11-21 ENCOUNTER — Ambulatory Visit: Payer: Medicare Other | Admitting: Family Medicine

## 2016-11-25 ENCOUNTER — Ambulatory Visit (INDEPENDENT_AMBULATORY_CARE_PROVIDER_SITE_OTHER): Payer: Medicare HMO | Admitting: Urology

## 2016-11-25 DIAGNOSIS — C61 Malignant neoplasm of prostate: Secondary | ICD-10-CM | POA: Diagnosis not present

## 2016-11-25 DIAGNOSIS — N393 Stress incontinence (female) (male): Secondary | ICD-10-CM | POA: Diagnosis not present

## 2016-11-25 DIAGNOSIS — N5231 Erectile dysfunction following radical prostatectomy: Secondary | ICD-10-CM | POA: Diagnosis not present

## 2016-11-30 ENCOUNTER — Ambulatory Visit (INDEPENDENT_AMBULATORY_CARE_PROVIDER_SITE_OTHER): Payer: Medicare HMO | Admitting: Family Medicine

## 2016-11-30 ENCOUNTER — Encounter: Payer: Self-pay | Admitting: Family Medicine

## 2016-11-30 VITALS — BP 130/84 | Ht 68.5 in | Wt 161.2 lb

## 2016-11-30 DIAGNOSIS — M21619 Bunion of unspecified foot: Secondary | ICD-10-CM | POA: Diagnosis not present

## 2016-11-30 DIAGNOSIS — M2010 Hallux valgus (acquired), unspecified foot: Secondary | ICD-10-CM | POA: Diagnosis not present

## 2016-11-30 DIAGNOSIS — Z79899 Other long term (current) drug therapy: Secondary | ICD-10-CM | POA: Diagnosis not present

## 2016-11-30 DIAGNOSIS — E784 Other hyperlipidemia: Secondary | ICD-10-CM

## 2016-11-30 DIAGNOSIS — E119 Type 2 diabetes mellitus without complications: Secondary | ICD-10-CM

## 2016-11-30 DIAGNOSIS — E7849 Other hyperlipidemia: Secondary | ICD-10-CM

## 2016-11-30 LAB — POCT GLYCOSYLATED HEMOGLOBIN (HGB A1C): Hemoglobin A1C: 4.8

## 2016-11-30 MED ORDER — METFORMIN HCL 500 MG PO TABS: 500.0000 mg | ORAL_TABLET | Freq: Two times a day (BID) | ORAL | 1 refills | Status: DC

## 2016-11-30 MED ORDER — LISINOPRIL 10 MG PO TABS: 10.0000 mg | ORAL_TABLET | Freq: Every day | ORAL | 1 refills | Status: DC

## 2016-11-30 NOTE — Progress Notes (Signed)
Subjective:    Patient ID: Charles Medina, male    DOB: 01/18/1941, 76 y.o.   MRN: [id number]  Diabetes  He presents for his follow-up diabetic visit. He has type 2 diabetes mellitus. There are no hypoglycemic associated symptoms. There are no diabetic associated symptoms. There are no hypoglycemic complications. There are no diabetic complications. There are no known risk factors for coronary artery disease. Current diabetic treatment includes oral agent (monotherapy). He is compliant with treatment all of the time.   Results for orders placed or performed in visit on 11/30/16  POCT glycosylated hemoglobin (Hb A1C)  Result Value Ref Range   Hemoglobin A1C 4.8     Patient has not had eye exam.  Patient has no concerns today.  Patient does have hyperlipidemia but states he has stopped taking his Lipitor. He wasn't certain if it is making him feel bad. He feels well without it. He does not one ago back on it currently unless numbers indicate strongly that he has to He is taking his blood pressure medicine denies any chest tightness pressure pain or shortness of breath He denies any rectal bleeding hematuria He does follow-up urology every 6 months He has had aortic aneurysm repaired with a stent that is been doing well. See specialist periodically further follow up on that  Review of Systems Currently no chest tightness pressure pain shortness breath does case and get some tingling into the toes has bunions on his feet. No ulcers according to patient States bowels are moving well urinating well breathing well no chest pressure    Objective:   Physical Exam neck no masses no crackles in the lungs respiratory rate is normal heart is regular pulses are normal bunions on the feet no ulcers on the feet and calves are normal no swelling  Diabetic foot exam shows bunions and some pre-ulcerative calluses but no ulcers    Assessment & Plan:  The patient was seen today as part of a comprehensive  visit for diabetes. The importance of keeping her A1c at or below 7 was discussed. Importance of regular physical activity was discussed. Proper monitoring of glucose levels with glucometer discussed. The importance of adherence to medication as well as a controlled low starch/sugar diet was also discussed. Also discussion regarding the importance of diabetic foot checks including self check every day. Also yearly diabetic eye exams recommended. The importance of keeping blood pressure under control and keeping LDL below 70 was also discussed. Also the importance of avoiding smoking. Standard follow-up visit recommended. Finally failure to follow good diabetic measures including self effort and compliance with recommendations can certainly increase the risk of heart disease strokes kidney failure blindness loss of limb and early death was discussed with the patient.  His A1c very good reduced metformin 500 mg twice a day to lessen the risk of acidosis check metabolic 7  HTN- Patient was seen today as part of a visit regarding hypertension. The importance of healthy diet and regular physical activity was discussed. The importance of compliance with medications discussed. Ideal goal is to keep blood pressure low elevated levels certainly below [id number] when possible. The patient was counseled that keeping blood pressure under control lessen his risk of heart attack, stroke, kidney failure, and early death. The importance of regular follow-ups was discussed with the patient. Low-salt diet such as DASH recommended. Regular physical activity was recommended as well. Patient was advised to keep regular follow-ups. Continue current medications.  The patient was seen today  as part of an evaluation regarding hyperlipidemia. Recent lab work has been reviewed with the patient as well as the goals for good cholesterol care. In addition to this medications have been discussed the importance of compliance with diet and  medications discussed as well. Patient has been informed of potential side effects of medications in the importance to notify us should any problems occur. Finally the patient is aware that poor control of cholesterol, noncompliance can dramatically increase her risk of heart attack strokes and premature death. The patient will keep regular office visits and the patient does agreed to periodic lab work.  Patient has chosen not to be on cholesterol medicine currently I recommend the patient check lipid profile may need to be encouraged to go back on medicine.  Follow-up 6 months

## 2016-12-01 LAB — LIPID PANEL
CHOLESTEROL TOTAL: 177 mg/dL (ref 100–199)
Chol/HDL Ratio: 4.8 ratio units (ref 0.0–5.0)
HDL: 37 mg/dL — ABNORMAL LOW (ref >39)
LDL Calculated: 123 mg/dL — ABNORMAL HIGH (ref 0–99)
TRIGLYCERIDES: 83 mg/dL (ref 0–149)
VLDL CHOLESTEROL CAL: 17 mg/dL (ref 5–40)

## 2016-12-01 LAB — BASIC METABOLIC PANEL
BUN/Creatinine Ratio: 12 (ref 10–24)
BUN: 16 mg/dL (ref 8–27)
CO2: 24 mmol/L (ref 18–29)
CREATININE: 1.3 mg/dL — AB (ref 0.76–1.27)
Calcium: 9.5 mg/dL (ref 8.6–10.2)
Chloride: 104 mmol/L (ref 96–106)
GFR calc Af Amer: 61 mL/min/{1.73_m2} (ref >59)
GFR, EST NON AFRICAN AMERICAN: 53 mL/min/{1.73_m2} — AB (ref >59)
Glucose: 85 mg/dL (ref 65–99)
Potassium: 4.6 mmol/L (ref 3.5–5.2)
SODIUM: 141 mmol/L (ref 134–144)

## 2016-12-05 ENCOUNTER — Other Ambulatory Visit: Payer: Self-pay | Admitting: *Deleted

## 2016-12-05 MED ORDER — ATORVASTATIN CALCIUM 80 MG PO TABS: 80.0000 mg | ORAL_TABLET | Freq: Every day | ORAL | 1 refills | Status: DC

## 2017-01-10 DIAGNOSIS — Z0100 Encounter for examination of eyes and vision without abnormal findings: Secondary | ICD-10-CM | POA: Diagnosis not present

## 2017-01-10 DIAGNOSIS — H25813 Combined forms of age-related cataract, bilateral: Secondary | ICD-10-CM | POA: Diagnosis not present

## 2017-01-10 DIAGNOSIS — H5200 Hypermetropia, unspecified eye: Secondary | ICD-10-CM | POA: Diagnosis not present

## 2017-01-10 LAB — HM DIABETES EYE EXAM

## 2017-02-16 ENCOUNTER — Encounter: Payer: Self-pay | Admitting: Cardiology

## 2017-03-17 ENCOUNTER — Other Ambulatory Visit: Payer: Self-pay | Admitting: Family Medicine

## 2017-04-04 ENCOUNTER — Other Ambulatory Visit: Payer: Self-pay | Admitting: Family Medicine

## 2017-04-10 ENCOUNTER — Telehealth: Payer: Self-pay | Admitting: Family Medicine

## 2017-04-10 DIAGNOSIS — E119 Type 2 diabetes mellitus without complications: Secondary | ICD-10-CM

## 2017-04-10 DIAGNOSIS — E7849 Other hyperlipidemia: Secondary | ICD-10-CM

## 2017-04-10 DIAGNOSIS — I10 Essential (primary) hypertension: Secondary | ICD-10-CM

## 2017-04-10 NOTE — Telephone Encounter (Signed)
Blood work ordered in [facility]. Patient notified. 

## 2017-04-10 NOTE — Telephone Encounter (Signed)
Rep samew plus m 7

## 2017-04-10 NOTE — Telephone Encounter (Signed)
Pt is needing lab orders sent over. Last labs per epic were: bmp,lipid,a1c on 11/30/16

## 2017-04-27 ENCOUNTER — Telehealth: Payer: Self-pay | Admitting: Family Medicine

## 2017-04-27 NOTE — Telephone Encounter (Signed)
Prescription called into [place]. Patient notified.

## 2017-04-27 NOTE — Telephone Encounter (Signed)
Patient is out of his test strips. Wants a rx sent in to [facility] for EasyMax diabetic test strips.

## 2017-05-15 DIAGNOSIS — E784 Other hyperlipidemia: Secondary | ICD-10-CM | POA: Diagnosis not present

## 2017-05-15 DIAGNOSIS — C61 Malignant neoplasm of prostate: Secondary | ICD-10-CM | POA: Diagnosis not present

## 2017-05-15 DIAGNOSIS — E119 Type 2 diabetes mellitus without complications: Secondary | ICD-10-CM | POA: Diagnosis not present

## 2017-05-15 DIAGNOSIS — I10 Essential (primary) hypertension: Secondary | ICD-10-CM | POA: Diagnosis not present

## 2017-05-16 LAB — LIPID PANEL
CHOL/HDL RATIO: 2.9 ratio (ref 0.0–5.0)
Cholesterol, Total: 109 mg/dL (ref 100–199)
HDL: 37 mg/dL — AB (ref >39)
LDL CALC: 63 mg/dL (ref 0–99)
Triglycerides: 47 mg/dL (ref 0–149)
VLDL CHOLESTEROL CAL: 9 mg/dL (ref 5–40)

## 2017-05-16 LAB — BASIC METABOLIC PANEL
BUN / CREAT RATIO: 16 (ref 10–24)
BUN: 21 mg/dL (ref 8–27)
CHLORIDE: 104 mmol/L (ref 96–106)
CO2: 23 mmol/L (ref 20–29)
Calcium: 9.9 mg/dL (ref 8.6–10.2)
Creatinine, Ser: 1.32 mg/dL — ABNORMAL HIGH (ref 0.76–1.27)
GFR calc Af Amer: 59 mL/min/{1.73_m2} — ABNORMAL LOW (ref >59)
GFR calc non Af Amer: 51 mL/min/{1.73_m2} — ABNORMAL LOW (ref >59)
GLUCOSE: 142 mg/dL — AB (ref 65–99)
POTASSIUM: 5.2 mmol/L (ref 3.5–5.2)
Sodium: 142 mmol/L (ref 134–144)

## 2017-05-16 LAB — HEMOGLOBIN A1C
Est. average glucose Bld gHb Est-mCnc: 166 mg/dL
HEMOGLOBIN A1C: 7.4 % — AB (ref 4.8–5.6)

## 2017-05-26 ENCOUNTER — Ambulatory Visit (INDEPENDENT_AMBULATORY_CARE_PROVIDER_SITE_OTHER): Payer: Medicare HMO | Admitting: Urology

## 2017-05-26 DIAGNOSIS — C61 Malignant neoplasm of prostate: Secondary | ICD-10-CM | POA: Diagnosis not present

## 2017-05-26 DIAGNOSIS — N393 Stress incontinence (female) (male): Secondary | ICD-10-CM | POA: Diagnosis not present

## 2017-05-26 DIAGNOSIS — N5231 Erectile dysfunction following radical prostatectomy: Secondary | ICD-10-CM | POA: Diagnosis not present

## 2017-05-30 ENCOUNTER — Encounter: Payer: Self-pay | Admitting: Family Medicine

## 2017-05-30 ENCOUNTER — Ambulatory Visit (INDEPENDENT_AMBULATORY_CARE_PROVIDER_SITE_OTHER): Payer: Medicare HMO | Admitting: Family Medicine

## 2017-05-30 VITALS — BP 162/80 | Ht 68.5 in | Wt 156.0 lb

## 2017-05-30 DIAGNOSIS — I10 Essential (primary) hypertension: Secondary | ICD-10-CM

## 2017-05-30 DIAGNOSIS — M21619 Bunion of unspecified foot: Secondary | ICD-10-CM | POA: Diagnosis not present

## 2017-05-30 DIAGNOSIS — I714 Abdominal aortic aneurysm, without rupture, unspecified: Secondary | ICD-10-CM

## 2017-05-30 DIAGNOSIS — E119 Type 2 diabetes mellitus without complications: Secondary | ICD-10-CM

## 2017-05-30 DIAGNOSIS — M2010 Hallux valgus (acquired), unspecified foot: Secondary | ICD-10-CM

## 2017-05-30 DIAGNOSIS — E784 Other hyperlipidemia: Secondary | ICD-10-CM

## 2017-05-30 DIAGNOSIS — E7849 Other hyperlipidemia: Secondary | ICD-10-CM

## 2017-05-30 MED ORDER — LISINOPRIL-HYDROCHLOROTHIAZIDE 10-12.5 MG PO TABS: 1.0000 | ORAL_TABLET | Freq: Every day | ORAL | 1 refills | Status: DC

## 2017-05-30 NOTE — Progress Notes (Signed)
   Subjective:    Patient ID: Charles Medina, male    DOB: 03/28/1941, 76 y.o.   MRN: [id number]  Diabetes  He presents for his follow-up diabetic visit. He has type 2 diabetes mellitus. Pertinent negatives for hypoglycemia include no confusion. Pertinent negatives for diabetes include no chest pain, no fatigue, no polydipsia, no polyphagia and no weakness. He is compliant with treatment all of the time. He is following a generally healthy diet. He participates in exercise three times a week. Home blood sugar record trend: 90's. He sees a podiatrist.Eye exam is current (just had eye exam).  A1C done on bloodwork. 7.4 Patient does take his medicines He is been exercising working out watching diet Denies being depressed Cognitively doing well Is able to remember 3 for 3 immediately after distraction tube for 3 No falls denies depression Patient followed by urology for prostate cancer Followed intermittently from vascular surgeon from aortic aneurysm repair  Pt states no concerns today.    Review of Systems  Constitutional: Negative for activity change, appetite change and fatigue.  HENT: Negative for congestion.   Respiratory: Negative for cough.   Cardiovascular: Negative for chest pain.  Gastrointestinal: Negative for abdominal pain.  Endocrine: Negative for polydipsia and polyphagia.  Neurological: Negative for weakness.  Psychiatric/Behavioral: Negative for confusion.       Objective:   Physical Exam  Constitutional: He appears well-nourished. No distress.  Cardiovascular: Normal rate, regular rhythm and normal heart sounds.   No murmur heard. Pulmonary/Chest: Effort normal and breath sounds normal. No respiratory distress.  Musculoskeletal: He exhibits no edema.  Lymphadenopathy:    He has no cervical adenopathy.  Neurological: He is alert.  Psychiatric: His behavior is normal.  Vitals reviewed.  Diabetic foot exam completed bunions pre-ulcerative calluses and minimal  neuropathy       Assessment & Plan:  Patient is not due for colonoscopy Safety dietary reviewed Diabetes fair control watch diet better Exercise doing well Cholesterol recent lab work looks good continue current measures Blood pressure subpar change medication repeat met 7 in one month follow-up blood pressure check later this fall Lisinopril with HCTZ new medicine. Note was also sent to vascular doctor for them to help schedule follow-up for him 

## 2017-05-30 NOTE — Progress Notes (Signed)
Patient takes metformin 1000 mg.

## 2017-06-01 ENCOUNTER — Telehealth: Payer: Self-pay | Admitting: Vascular Surgery

## 2017-06-01 NOTE — Telephone Encounter (Signed)
Sched labs 07/07/17 at 9:00 and MD 07/14/17 at 3:15. Spoke to pt.

## 2017-06-01 NOTE — Telephone Encounter (Signed)
-----   Message from Mena Goes, RN sent at 05/30/2017  1:24 PM EDT ----- Regarding: 1-2 months appt with EVAR duplex   ----- Message ----- From: Conrad R[place], MD Sent: 05/30/2017   1:05 PM To: [facility] [address]  DAO MEMMOTT [phone] [birth year]  Pt is s/p EVAR with P[facility].  He needs to follow up for EVAR duplex and BLE ABI in next 1-2 months.

## 2017-06-27 ENCOUNTER — Other Ambulatory Visit: Payer: Self-pay

## 2017-06-27 DIAGNOSIS — Z48812 Encounter for surgical aftercare following surgery on the circulatory system: Secondary | ICD-10-CM

## 2017-06-27 DIAGNOSIS — I739 Peripheral vascular disease, unspecified: Secondary | ICD-10-CM

## 2017-06-30 DIAGNOSIS — I10 Essential (primary) hypertension: Secondary | ICD-10-CM | POA: Diagnosis not present

## 2017-07-01 LAB — BASIC METABOLIC PANEL
BUN / CREAT RATIO: 10 (ref 10–24)
BUN: 12 mg/dL (ref 8–27)
CHLORIDE: 100 mmol/L (ref 96–106)
CO2: 26 mmol/L (ref 20–29)
CREATININE: 1.19 mg/dL (ref 0.76–1.27)
Calcium: 9.6 mg/dL (ref 8.6–10.2)
GFR calc Af Amer: 67 mL/min/{1.73_m2} (ref >59)
GFR calc non Af Amer: 58 mL/min/{1.73_m2} — ABNORMAL LOW (ref >59)
GLUCOSE: 120 mg/dL — AB (ref 65–99)
Potassium: 4.7 mmol/L (ref 3.5–5.2)
Sodium: 140 mmol/L (ref 134–144)

## 2017-07-02 ENCOUNTER — Encounter: Payer: Self-pay | Admitting: Family Medicine

## 2017-07-05 ENCOUNTER — Encounter: Payer: Self-pay | Admitting: Vascular Surgery

## 2017-07-06 ENCOUNTER — Telehealth: Payer: Self-pay | Admitting: Family Medicine

## 2017-07-06 NOTE — Telephone Encounter (Signed)
Pt called to let Dr. Nicki Reaper know that he takes 1000mg  of metformin He takes half a tablet in the morning and the other half in the evening

## 2017-07-07 ENCOUNTER — Encounter (HOSPITAL_COMMUNITY): Payer: Medicare HMO

## 2017-07-07 ENCOUNTER — Other Ambulatory Visit (HOSPITAL_COMMUNITY): Payer: Medicare HMO

## 2017-07-11 ENCOUNTER — Other Ambulatory Visit: Payer: Self-pay | Admitting: *Deleted

## 2017-07-11 MED ORDER — METFORMIN HCL 1000 MG PO TABS: ORAL_TABLET | ORAL | 0 refills | Status: DC

## 2017-07-11 NOTE — Telephone Encounter (Signed)
Med list updated

## 2017-07-11 NOTE — Telephone Encounter (Signed)
Nurse's-please update medication list to reflect that he is doing 1000 mg of metformin one half tablet twice daily. A limited the other prescription which was the 500 mg thank you

## 2017-07-14 ENCOUNTER — Encounter: Payer: Medicare HMO | Admitting: Vascular Surgery

## 2017-08-22 ENCOUNTER — Telehealth: Payer: Self-pay | Admitting: Family Medicine

## 2017-08-22 ENCOUNTER — Other Ambulatory Visit: Payer: Self-pay

## 2017-08-22 MED ORDER — METFORMIN HCL 1000 MG PO TABS: ORAL_TABLET | ORAL | 1 refills | Status: DC

## 2017-08-22 NOTE — Telephone Encounter (Signed)
Spoke with patient and informed him the refills were sent in on metformin. Patient verbalized understanding.

## 2017-08-22 NOTE — Telephone Encounter (Signed)
Pt is needing a refill on  metFORMIN (GLUCOPHAGE) 1000 MG tablet  [facility]

## 2017-09-04 ENCOUNTER — Other Ambulatory Visit: Payer: Self-pay | Admitting: Family Medicine

## 2017-09-25 ENCOUNTER — Other Ambulatory Visit: Payer: Self-pay | Admitting: Family Medicine

## 2017-10-02 ENCOUNTER — Encounter: Payer: Self-pay | Admitting: Family Medicine

## 2017-10-02 ENCOUNTER — Ambulatory Visit: Payer: Medicare HMO | Admitting: Family Medicine

## 2017-10-02 VITALS — BP 138/82 | Ht 68.5 in | Wt 156.0 lb

## 2017-10-02 DIAGNOSIS — Z125 Encounter for screening for malignant neoplasm of prostate: Secondary | ICD-10-CM

## 2017-10-02 DIAGNOSIS — E119 Type 2 diabetes mellitus without complications: Secondary | ICD-10-CM | POA: Diagnosis not present

## 2017-10-02 DIAGNOSIS — Z23 Encounter for immunization: Secondary | ICD-10-CM

## 2017-10-02 LAB — POCT GLYCOSYLATED HEMOGLOBIN (HGB A1C): HEMOGLOBIN A1C: 7.2

## 2017-10-02 NOTE — Progress Notes (Signed)
Subjective:    Patient ID: Charles Medina, male    DOB: 1941-02-08, 76 y.o.   MRN: [id number]  Diabetes  He presents for his follow-up diabetic visit. He has type 2 diabetes mellitus. Pertinent negatives for hypoglycemia include no confusion. Pertinent negatives for diabetes include no chest pain, no fatigue, no polydipsia, no polyphagia and no weakness. Risk factors for coronary artery disease include diabetes mellitus, dyslipidemia and hypertension. Current diabetic treatment includes oral agent (dual therapy). He is compliant with treatment all of the time. His weight is stable. He is following a diabetic diet. He has had a previous visit with a dietitian. He sees a podiatrist.Eye exam is current.  Patient does good job watching diet staying physically active taking his medications.  Denies any chest tightness pressure pain shortness of breath    Review of Systems  Constitutional: Negative for activity change, appetite change and fatigue.  HENT: Negative for congestion.   Respiratory: Negative for cough.   Cardiovascular: Negative for chest pain.  Gastrointestinal: Negative for abdominal pain.  Endocrine: Negative for polydipsia and polyphagia.  Neurological: Negative for weakness.  Psychiatric/Behavioral: Negative for confusion.       Objective:   Physical Exam  Constitutional: He appears well-nourished. No distress.  Cardiovascular: Normal rate, regular rhythm and normal heart sounds.  No murmur heard. Pulmonary/Chest: Effort normal and breath sounds normal. No respiratory distress.  Musculoskeletal: He exhibits no edema.  Lymphadenopathy:    He has no cervical adenopathy.  Neurological: He is alert.  Psychiatric: His behavior is normal.  Vitals reviewed.  Patient does have bunions mild neuropathy no ulcers noted.       Assessment & Plan:  The patient was seen today as part of a comprehensive visit for diabetes. The importance of keeping her A1c at or below 7 was  discussed. Importance of regular physical activity was discussed. Proper monitoring of glucose levels with glucometer discussed. The importance of adherence to medication as well as a controlled low starch/sugar diet was also discussed. Also discussion regarding the importance of diabetic foot checks including self check every day. Also yearly diabetic eye exams recommended. The importance of keeping blood pressure under control and keeping LDL below 70 was also discussed. Also the importance of avoiding smoking. Standard follow-up visit recommended. Finally failure to follow good diabetic measures including self effort and compliance with recommendations can certainly increase the risk of heart disease strokes kidney failure blindness loss of limb and early death was discussed with the patient.  HTN- Patient was seen today as part of a visit regarding hypertension. The importance of healthy diet and regular physical activity was discussed. The importance of compliance with medications discussed. Ideal goal is to keep blood pressure low elevated levels certainly below 782/95 when possible. The patient was counseled that keeping blood pressure under control lessen his risk of heart attack, stroke, kidney failure, and early death. The importance of regular follow-ups was discussed with the patient. Low-salt diet such as DASH recommended. Regular physical activity was recommended as well. Patient was advised to keep regular follow-ups.  The patient was seen today as part of an evaluation regarding hyperlipidemia. Recent lab work has been reviewed with the patient as well as the goals for good cholesterol care. In addition to this medications have been discussed the importance of compliance with diet and medications discussed as well. Patient has been informed of potential side effects of medications in the importance to notify us should any problems occur. Finally the  patient is aware that poor control of  cholesterol, noncompliance can dramatically increase her risk of heart attack strokes and premature death. The patient will keep regular office visits and the patient does agreed to periodic lab work.

## 2017-10-03 LAB — MICROALBUMIN / CREATININE URINE RATIO
Creatinine, Urine: 134.3 mg/dL
Microalb/Creat Ratio: 30.2 mg/g creat — ABNORMAL HIGH (ref 0.0–30.0)
Microalbumin, Urine: 40.6 ug/mL

## 2017-10-03 LAB — BASIC METABOLIC PANEL
BUN / CREAT RATIO: 11 (ref 10–24)
BUN: 17 mg/dL (ref 8–27)
CO2: 23 mmol/L (ref 20–29)
CREATININE: 1.48 mg/dL — AB (ref 0.76–1.27)
Calcium: 9.5 mg/dL (ref 8.6–10.2)
Chloride: 101 mmol/L (ref 96–106)
GFR, EST AFRICAN AMERICAN: 52 mL/min/{1.73_m2} — AB (ref >59)
GFR, EST NON AFRICAN AMERICAN: 45 mL/min/{1.73_m2} — AB (ref >59)
GLUCOSE: 137 mg/dL — AB (ref 65–99)
Potassium: 4.4 mmol/L (ref 3.5–5.2)
SODIUM: 141 mmol/L (ref 134–144)

## 2017-10-03 LAB — PSA: PROSTATE SPECIFIC AG, SERUM: 0.2 ng/mL (ref 0.0–4.0)

## 2017-10-05 ENCOUNTER — Other Ambulatory Visit: Payer: Self-pay | Admitting: *Deleted

## 2017-10-05 DIAGNOSIS — R7989 Other specified abnormal findings of blood chemistry: Secondary | ICD-10-CM

## 2017-10-05 DIAGNOSIS — Z79899 Other long term (current) drug therapy: Secondary | ICD-10-CM

## 2017-10-05 MED ORDER — LISINOPRIL 10 MG PO TABS: 10.0000 mg | ORAL_TABLET | Freq: Every day | ORAL | 1 refills | Status: DC

## 2017-11-03 ENCOUNTER — Encounter (INDEPENDENT_AMBULATORY_CARE_PROVIDER_SITE_OTHER): Payer: Self-pay | Admitting: *Deleted

## 2017-11-04 ENCOUNTER — Other Ambulatory Visit: Payer: Self-pay | Admitting: Family Medicine

## 2017-12-27 ENCOUNTER — Other Ambulatory Visit: Payer: Self-pay | Admitting: *Deleted

## 2017-12-27 MED ORDER — ATORVASTATIN CALCIUM 80 MG PO TABS: 80.0000 mg | ORAL_TABLET | Freq: Every day | ORAL | 0 refills | Status: DC

## 2018-01-03 ENCOUNTER — Other Ambulatory Visit: Payer: Self-pay | Admitting: Family Medicine

## 2018-02-09 ENCOUNTER — Other Ambulatory Visit: Payer: Self-pay | Admitting: Family Medicine

## 2018-03-19 ENCOUNTER — Ambulatory Visit (INDEPENDENT_AMBULATORY_CARE_PROVIDER_SITE_OTHER): Payer: Medicare HMO | Admitting: Family Medicine

## 2018-03-19 ENCOUNTER — Encounter: Payer: Self-pay | Admitting: Family Medicine

## 2018-03-19 VITALS — BP 138/86 | Ht 68.5 in | Wt 157.4 lb

## 2018-03-19 DIAGNOSIS — E7849 Other hyperlipidemia: Secondary | ICD-10-CM

## 2018-03-19 DIAGNOSIS — M21619 Bunion of unspecified foot: Secondary | ICD-10-CM | POA: Diagnosis not present

## 2018-03-19 DIAGNOSIS — M2010 Hallux valgus (acquired), unspecified foot: Secondary | ICD-10-CM | POA: Diagnosis not present

## 2018-03-19 DIAGNOSIS — I10 Essential (primary) hypertension: Secondary | ICD-10-CM | POA: Diagnosis not present

## 2018-03-19 DIAGNOSIS — Z1211 Encounter for screening for malignant neoplasm of colon: Secondary | ICD-10-CM

## 2018-03-19 DIAGNOSIS — E1169 Type 2 diabetes mellitus with other specified complication: Secondary | ICD-10-CM | POA: Diagnosis not present

## 2018-03-19 LAB — POCT GLYCOSYLATED HEMOGLOBIN (HGB A1C): Hemoglobin A1C: 6.4

## 2018-03-19 MED ORDER — LISINOPRIL 10 MG PO TABS: 10.0000 mg | ORAL_TABLET | Freq: Every day | ORAL | 1 refills | Status: DC

## 2018-03-19 MED ORDER — METFORMIN HCL 1000 MG PO TABS: 500.0000 mg | ORAL_TABLET | Freq: Two times a day (BID) | ORAL | 1 refills | Status: DC

## 2018-03-19 MED ORDER — ATORVASTATIN CALCIUM 80 MG PO TABS: 80.0000 mg | ORAL_TABLET | Freq: Every day | ORAL | 1 refills | Status: DC

## 2018-03-19 MED ORDER — METFORMIN HCL 500 MG PO TABS: 500.0000 mg | ORAL_TABLET | Freq: Two times a day (BID) | ORAL | 1 refills | Status: DC

## 2018-03-19 NOTE — Progress Notes (Signed)
Subjective:    Patient ID: Charles Medina, male    DOB: [birth year], 77 y.o.   MRN: [id number]  Diabetes  He presents for his follow-up diabetic visit. He has type 2 diabetes mellitus. There are no hypoglycemic associated symptoms. Pertinent negatives for hypoglycemia include no confusion or headaches. There are no diabetic associated symptoms. Pertinent negatives for diabetes include no chest pain, no fatigue, no polydipsia, no polyphagia and no weakness. He participates in exercise daily.   Patient with foot deformity denies any ulcers on his feet denies any difficult issues  Patient here for follow-up regarding cholesterol.  Patient does try to maintain a reasonable diet.  Patient does take the medication on a regular basis.  Denies missing a dose.  The patient denies any obvious side effects.  Prior blood work results reviewed with the patient.  The patient is aware of his cholesterol goals and the need to keep it under good control to lessen the risk of disease.  Patient for blood pressure check up. Patient relates compliance with meds. Todays BP reviewed with the patient. Patient denies issues with medication. Patient relates reasonable diet. Patient tries to minimize salt. Patient aware of BP goals.    Review of Systems  Constitutional: Negative for activity change, appetite change and fatigue.  HENT: Negative for congestion and rhinorrhea.   Respiratory: Negative for cough, choking and shortness of breath.   Cardiovascular: Negative for chest pain and leg swelling.  Gastrointestinal: Negative for constipation and nausea.  Endocrine: Negative for polydipsia and polyphagia.  Genitourinary: Negative for difficulty urinating and dysuria.  Neurological: Negative for weakness and headaches.  Psychiatric/Behavioral: Negative for confusion and dysphoric mood.       Objective:   Physical Exam  Constitutional: He appears well-nourished. No distress.  Cardiovascular: Normal rate, regular  rhythm and normal heart sounds.  No murmur heard. Pulmonary/Chest: Effort normal and breath sounds normal. No respiratory distress.  Musculoskeletal: He exhibits no edema.  Lymphadenopathy:    He has no cervical adenopathy.  Neurological: He is alert.  Psychiatric: His behavior is normal.  Vitals reviewed.         Assessment & Plan:  The patient was seen today as part of a comprehensive visit for diabetes. The importance of keeping her A1c at or below 7 was discussed.  Importance of regular physical activity was discussed.   The importance of adherence to medication as well as a controlled low starch/sugar diet was also discussed.  Standard follow-up visit recommended.  Finally failure to follow good diabetic measures including self effort and compliance with recommendations can certainly increase the risk of heart disease strokes kidney failure blindness loss of limb and early death was discussed with the patient.  HTN- Patient was seen today as part of a visit regarding hypertension. The importance of healthy diet and regular physical activity was discussed. The importance of compliance with medications discussed.  Ideal goal is to keep blood pressure low elevated levels certainly below 734/19 when possible.  The patient was counseled that keeping blood pressure under control lessen his risk of heart attack, stroke, kidney failure, and early death.  The importance of regular follow-ups was discussed with the patient.  Low-salt diet such as DASH recommended. Regular physical activity was recommended as well.  Patient was advised to keep regular follow-ups.  The patient was seen today as part of an evaluation regarding hyperlipidemia.  Recent lab work has been reviewed with the patient as well as the goals for good  cholesterol care.  In addition to this medications have been discussed the importance of compliance with diet and medications discussed as well.  Finally the patient is  aware that poor control of cholesterol, noncompliance can dramatically increase her risk of heart attack strokes and premature death.  The patient will keep regular office visits and the patient does agreed to periodic lab work.  Patient is due for colonoscopy but does not want colonoscopy.  Has had tubular adenoma in the past but no cancer.  Patient does agree to do stool test for blood but does not want colonoscopy Follow-up 6 months

## 2018-03-21 DIAGNOSIS — I10 Essential (primary) hypertension: Secondary | ICD-10-CM | POA: Diagnosis not present

## 2018-03-21 DIAGNOSIS — E7849 Other hyperlipidemia: Secondary | ICD-10-CM | POA: Diagnosis not present

## 2018-03-22 ENCOUNTER — Encounter: Payer: Self-pay | Admitting: Family Medicine

## 2018-03-22 LAB — HEPATIC FUNCTION PANEL
ALBUMIN: 4.2 g/dL (ref 3.5–4.8)
ALT: 21 IU/L (ref 0–44)
AST: 18 IU/L (ref 0–40)
Alkaline Phosphatase: 103 IU/L (ref 39–117)
BILIRUBIN TOTAL: 0.3 mg/dL (ref 0.0–1.2)
Bilirubin, Direct: 0.11 mg/dL (ref 0.00–0.40)
TOTAL PROTEIN: 6.6 g/dL (ref 6.0–8.5)

## 2018-03-22 LAB — LIPID PANEL
CHOL/HDL RATIO: 3 ratio (ref 0.0–5.0)
Cholesterol, Total: 105 mg/dL (ref 100–199)
HDL: 35 mg/dL — ABNORMAL LOW (ref >39)
LDL Calculated: 62 mg/dL (ref 0–99)
TRIGLYCERIDES: 38 mg/dL (ref 0–149)
VLDL Cholesterol Cal: 8 mg/dL (ref 5–40)

## 2018-03-26 ENCOUNTER — Other Ambulatory Visit: Payer: Self-pay

## 2018-03-26 DIAGNOSIS — Z1211 Encounter for screening for malignant neoplasm of colon: Secondary | ICD-10-CM

## 2018-03-26 LAB — IFOBT (OCCULT BLOOD): IFOBT: NEGATIVE

## 2018-04-01 ENCOUNTER — Encounter: Payer: Self-pay | Admitting: Family Medicine

## 2018-04-24 ENCOUNTER — Telehealth: Payer: Self-pay | Admitting: Family Medicine

## 2018-04-24 NOTE — Telephone Encounter (Signed)
Patient is needing handicap placard signed.  See in yellow box.

## 2018-04-26 NOTE — Telephone Encounter (Signed)
The form was completed thank you 

## 2018-06-19 ENCOUNTER — Telehealth: Payer: Self-pay | Admitting: Family Medicine

## 2018-06-19 DIAGNOSIS — H18413 Arcus senilis, bilateral: Secondary | ICD-10-CM | POA: Diagnosis not present

## 2018-06-19 DIAGNOSIS — H5200 Hypermetropia, unspecified eye: Secondary | ICD-10-CM | POA: Diagnosis not present

## 2018-06-19 LAB — HM DIABETES EYE EXAM

## 2018-06-19 NOTE — Telephone Encounter (Signed)
Patient said he is working out in the gym lately, and wants to know what kind of Vitamin Dr. Nicki Reaper recommends he take?

## 2018-06-19 NOTE — Telephone Encounter (Signed)
Patient advised Dr Nicki Reaper would recommend standard Centrum vitamin 1 daily. Patient verbalized understanding.

## 2018-06-19 NOTE — Telephone Encounter (Signed)
I would recommend standard Centrum vitamin 1 daily.

## 2018-08-28 ENCOUNTER — Other Ambulatory Visit: Payer: Self-pay | Admitting: Family Medicine

## 2018-09-19 ENCOUNTER — Ambulatory Visit (INDEPENDENT_AMBULATORY_CARE_PROVIDER_SITE_OTHER): Payer: Medicare HMO | Admitting: Family Medicine

## 2018-09-19 ENCOUNTER — Encounter: Payer: Self-pay | Admitting: Family Medicine

## 2018-09-19 VITALS — BP 144/88 | Ht 68.5 in | Wt 157.0 lb

## 2018-09-19 DIAGNOSIS — E119 Type 2 diabetes mellitus without complications: Secondary | ICD-10-CM

## 2018-09-19 DIAGNOSIS — I10 Essential (primary) hypertension: Secondary | ICD-10-CM | POA: Diagnosis not present

## 2018-09-19 DIAGNOSIS — E7849 Other hyperlipidemia: Secondary | ICD-10-CM

## 2018-09-19 DIAGNOSIS — Z23 Encounter for immunization: Secondary | ICD-10-CM | POA: Diagnosis not present

## 2018-09-19 DIAGNOSIS — D696 Thrombocytopenia, unspecified: Secondary | ICD-10-CM

## 2018-09-19 DIAGNOSIS — Z8546 Personal history of malignant neoplasm of prostate: Secondary | ICD-10-CM

## 2018-09-19 LAB — POCT GLYCOSYLATED HEMOGLOBIN (HGB A1C): HEMOGLOBIN A1C: 5.9 % — AB (ref 4.0–5.6)

## 2018-09-19 NOTE — Progress Notes (Signed)
Subjective:    Patient ID: Charles Medina, male    DOB: 1941/01/13, 77 y.o.   MRN: [id number]  HPI Patient is here today for follow up on chronic illnesses. He is diabetic and takes Metformin 500 mg 1/2 BID he is unsure which Metformin he is on but takes 1/2 bid. The medlist has both 500 mg 1bid and Metformin 100 mg 1/2 bid.He eats healthy and he exercise 3-7 days per week. He does not see any speciatlist.   Results for orders placed or performed in visit on 09/19/18  POCT glycosylated hemoglobin (Hb A1C)  Result Value Ref Range   Hemoglobin A1C 5.9 (A) 4.0 - 5.6 %   HbA1c POC (<> result, manual entry)     HbA1c, POC (prediabetic range)     HbA1c, POC (controlled diabetic range)       Patient for blood pressure check up.  The patient does have hypertension.  The patient is on medication.  Patient relates compliance with meds. Todays BP reviewed with the patient. Patient denies issues with medication. Patient relates reasonable diet. Patient tries to minimize salt. Patient aware of BP goals.  Patient here for follow-up regarding cholesterol.  The patient does have hyperlipidemia.  Patient does try to maintain a reasonable diet.  Patient does take the medication on a regular basis.  Denies missing a dose.  The patient denies any obvious side effects.  Prior blood work results reviewed with the patient.  The patient is aware of his cholesterol goals and the need to keep it under good control to lessen the risk of disease.  The patient was seen today as part of a comprehensive diabetic check up.the patient does have diabetes.  The patient follows here on a regular basis.  The patient relates medication compliance. No significant side effects to the medications. Denies any low glucose spells. Relates compliance with diet to a reasonable level. Patient does do labwork intermittently and understands the dangers of diabetes. he also has a history of thrombocytopenia but not having any is compliant with  his medicine  Review of Systems  Constitutional: Negative for diaphoresis and fatigue.  HENT: Negative for congestion and rhinorrhea.   Respiratory: Negative for cough and shortness of breath.   Cardiovascular: Negative for chest pain and leg swelling.  Gastrointestinal: Negative for abdominal pain and diarrhea.  Skin: Negative for color change and rash.  Neurological: Negative for dizziness and headaches.  Psychiatric/Behavioral: Negative for behavioral problems and confusion.   Results for orders placed or performed in visit on 09/19/18  POCT glycosylated hemoglobin (Hb A1C)  Result Value Ref Range   Hemoglobin A1C 5.9 (A) 4.0 - 5.6 %   HbA1c POC (<> result, manual entry)     HbA1c, POC (prediabetic range)     HbA1c, POC (controlled diabetic range)         Objective:   Physical Exam  Constitutional: He appears well-nourished. No distress.  HENT:  Head: Normocephalic and atraumatic.  Eyes: Right eye exhibits no discharge. Left eye exhibits no discharge.  Neck: No tracheal deviation present.  Cardiovascular: Normal rate, regular rhythm and normal heart sounds.  No murmur heard. Pulmonary/Chest: Effort normal and breath sounds normal. No respiratory distress.  Musculoskeletal: He exhibits no edema.  Lymphadenopathy:    He has no cervical adenopathy.  Neurological: He is alert. Coordination normal.  Skin: Skin is warm and dry.  Psychiatric: He has a normal mood and affect. His behavior is normal.  Vitals reviewed.  Assessment & Plan:  The patient was seen today as part of a comprehensive visit for diabetes. The importance of keeping her A1c at or below 7 was discussed.  Importance of regular physical activity was discussed.   The importance of adherence to medication as well as a controlled low starch/sugar diet was also discussed.  Standard follow-up visit recommended.  Also patient aware failure to keep diabetes under control increases the risk of  complications.  HTN- Patient was seen today as part of a visit regarding hypertension. The importance of healthy diet and regular physical activity was discussed. The importance of compliance with medications discussed.  Ideal goal is to keep blood pressure low elevated levels certainly below 371/69 when possible.  The patient was counseled that keeping blood pressure under control lessen his risk of complications.  The importance of regular follow-ups was discussed with the patient.  Low-salt diet such as DASH recommended.  Regular physical activity was recommended as well.  Patient was advised to keep regular follow-ups.  The patient was seen today as part of an evaluation regarding hyperlipidemia.  Recent lab work has been reviewed with the patient as well as the goals for good cholesterol care.  In addition to this medications have been discussed the importance of compliance with diet and medications discussed as well.  Finally the patient is aware that poor control of cholesterol, noncompliance can dramatically increase the risk of complications. The patient will keep regular office visits and the patient does agreed to periodic lab work.  25 minutes was spent with the patient.  This statement verifies that 25 minutes was indeed spent with the patient.  More than 50% of this visit-total duration of the visit-was spent in counseling and coordination of care. The issues that the patient came in for today as reflected in the diagnosis (s) please refer to documentation for further details.   Patient with thrombocytopenia we will recheck CBC to see where this is going continue current measures currently  Follow-up 6 months sooner if any problems

## 2018-09-19 NOTE — Patient Instructions (Signed)
DASH Eating Plan DASH stands for "Dietary Approaches to Stop Hypertension." The DASH eating plan is a healthy eating plan that has been shown to reduce high blood pressure (hypertension). It may also reduce your risk for type 2 diabetes, heart disease, and stroke. The DASH eating plan may also help with weight loss. What are tips for following this plan? General guidelines  Avoid eating more than 2,300 mg (milligrams) of salt (sodium) a day. If you have hypertension, you may need to reduce your sodium intake to 1,500 mg a day.  Limit alcohol intake to no more than 1 drink a day for nonpregnant women and 2 drinks a day for men. One drink equals 12 oz of beer, 5 oz of wine, or 1 oz of hard liquor.  Work with your health care provider to maintain a healthy body weight or to lose weight. Ask what an ideal weight is for you.  Get at least 30 minutes of exercise that causes your heart to beat faster (aerobic exercise) most days of the week. Activities may include walking, swimming, or biking.  Work with your health care provider or diet and nutrition specialist (dietitian) to adjust your eating plan to your individual calorie needs. Reading food labels  Check food labels for the amount of sodium per serving. Choose foods with less than 5 percent of the Daily Value of sodium. Generally, foods with less than 300 mg of sodium per serving fit into this eating plan.  To find whole grains, look for the word "whole" as the first word in the ingredient list. Shopping  Buy products labeled as "low-sodium" or "no salt added."  Buy fresh foods. Avoid canned foods and premade or frozen meals. Cooking  Avoid adding salt when cooking. Use salt-free seasonings or herbs instead of table salt or sea salt. Check with your health care provider or pharmacist before using salt substitutes.  Do not fry foods. Cook foods using healthy methods such as baking, boiling, grilling, and broiling instead.  Cook with  heart-healthy oils, such as olive, canola, soybean, or sunflower oil. Meal planning   Eat a balanced diet that includes: ? 5 or more servings of fruits and vegetables each day. At each meal, try to fill half of your plate with fruits and vegetables. ? Up to 6-8 servings of whole grains each day. ? Less than 6 oz of lean meat, poultry, or fish each day. A 3-oz serving of meat is about the same size as a deck of cards. One egg equals 1 oz. ? 2 servings of low-fat dairy each day. ? A serving of nuts, seeds, or beans 5 times each week. ? Heart-healthy fats. Healthy fats called Omega-3 fatty acids are found in foods such as flaxseeds and coldwater fish, like sardines, salmon, and mackerel.  Limit how much you eat of the following: ? Canned or prepackaged foods. ? Food that is high in trans fat, such as fried foods. ? Food that is high in saturated fat, such as fatty meat. ? Sweets, desserts, sugary drinks, and other foods with added sugar. ? Full-fat dairy products.  Do not salt foods before eating.  Try to eat at least 2 vegetarian meals each week.  Eat more home-cooked food and less restaurant, buffet, and fast food.  When eating at a restaurant, ask that your food be prepared with less salt or no salt, if possible. What foods are recommended? The items listed may not be a complete list. Talk with your dietitian about what   dietary choices are best for you. Grains Whole-grain or whole-wheat bread. Whole-grain or whole-wheat pasta. Brown rice. Oatmeal. Quinoa. Bulgur. Whole-grain and low-sodium cereals. Pita bread. Low-fat, low-sodium crackers. Whole-wheat flour tortillas. Vegetables Fresh or frozen vegetables (raw, steamed, roasted, or grilled). Low-sodium or reduced-sodium tomato and vegetable juice. Low-sodium or reduced-sodium tomato sauce and tomato paste. Low-sodium or reduced-sodium canned vegetables. Fruits All fresh, dried, or frozen fruit. Canned fruit in natural juice (without  added sugar). Meat and other protein foods Skinless chicken or turkey. Ground chicken or turkey. Pork with fat trimmed off. Fish and seafood. Egg whites. Dried beans, peas, or lentils. Unsalted nuts, nut butters, and seeds. Unsalted canned beans. Lean cuts of beef with fat trimmed off. Low-sodium, lean deli meat. Dairy Low-fat (1%) or fat-free (skim) milk. Fat-free, low-fat, or reduced-fat cheeses. Nonfat, low-sodium ricotta or cottage cheese. Low-fat or nonfat yogurt. Low-fat, low-sodium cheese. Fats and oils Soft margarine without trans fats. Vegetable oil. Low-fat, reduced-fat, or light mayonnaise and salad dressings (reduced-sodium). Canola, safflower, olive, soybean, and sunflower oils. Avocado. Seasoning and other foods Herbs. Spices. Seasoning mixes without salt. Unsalted popcorn and pretzels. Fat-free sweets. What foods are not recommended? The items listed may not be a complete list. Talk with your dietitian about what dietary choices are best for you. Grains Baked goods made with fat, such as croissants, muffins, or some breads. Dry pasta or rice meal packs. Vegetables Creamed or fried vegetables. Vegetables in a cheese sauce. Regular canned vegetables (not low-sodium or reduced-sodium). Regular canned tomato sauce and paste (not low-sodium or reduced-sodium). Regular tomato and vegetable juice (not low-sodium or reduced-sodium). Pickles. Olives. Fruits Canned fruit in a light or heavy syrup. Fried fruit. Fruit in cream or butter sauce. Meat and other protein foods Fatty cuts of meat. Ribs. Fried meat. Bacon. Sausage. Bologna and other processed lunch meats. Salami. Fatback. Hotdogs. Bratwurst. Salted nuts and seeds. Canned beans with added salt. Canned or smoked fish. Whole eggs or egg yolks. Chicken or turkey with skin. Dairy Whole or 2% milk, cream, and half-and-half. Whole or full-fat cream cheese. Whole-fat or sweetened yogurt. Full-fat cheese. Nondairy creamers. Whipped toppings.  Processed cheese and cheese spreads. Fats and oils Butter. Stick margarine. Lard. Shortening. Ghee. Bacon fat. Tropical oils, such as coconut, palm kernel, or palm oil. Seasoning and other foods Salted popcorn and pretzels. Onion salt, garlic salt, seasoned salt, table salt, and sea salt. Worcestershire sauce. Tartar sauce. Barbecue sauce. Teriyaki sauce. Soy sauce, including reduced-sodium. Steak sauce. Canned and packaged gravies. Fish sauce. Oyster sauce. Cocktail sauce. Horseradish that you find on the shelf. Ketchup. Mustard. Meat flavorings and tenderizers. Bouillon cubes. Hot sauce and Tabasco sauce. Premade or packaged marinades. Premade or packaged taco seasonings. Relishes. Regular salad dressings. Where to find more information:  National Heart, Lung, and Blood Institute: www.nhlbi.nih.gov  American Heart Association: www.heart.org Summary  The DASH eating plan is a healthy eating plan that has been shown to reduce high blood pressure (hypertension). It may also reduce your risk for type 2 diabetes, heart disease, and stroke.  With the DASH eating plan, you should limit salt (sodium) intake to 2,300 mg a day. If you have hypertension, you may need to reduce your sodium intake to 1,500 mg a day.  When on the DASH eating plan, aim to eat more fresh fruits and vegetables, whole grains, lean proteins, low-fat dairy, and heart-healthy fats.  Work with your health care provider or diet and nutrition specialist (dietitian) to adjust your eating plan to your individual   calorie needs. This information is not intended to replace advice given to you by your health care provider. Make sure you discuss any questions you have with your health care provider. Document Released: 10/13/2011 Document Revised: 10/17/2016 Document Reviewed: 10/17/2016 Elsevier Interactive Patient Education  2018 [employer]  

## 2018-09-20 LAB — BASIC METABOLIC PANEL
BUN / CREAT RATIO: 11 (ref 10–24)
BUN: 15 mg/dL (ref 8–27)
CHLORIDE: 103 mmol/L (ref 96–106)
CO2: 23 mmol/L (ref 20–29)
Calcium: 10.1 mg/dL (ref 8.6–10.2)
Creatinine, Ser: 1.31 mg/dL — ABNORMAL HIGH (ref 0.76–1.27)
GFR calc non Af Amer: 51 mL/min/{1.73_m2} — ABNORMAL LOW (ref >59)
GFR, EST AFRICAN AMERICAN: 59 mL/min/{1.73_m2} — AB (ref >59)
Glucose: 113 mg/dL — ABNORMAL HIGH (ref 65–99)
Potassium: 4.9 mmol/L (ref 3.5–5.2)
SODIUM: 141 mmol/L (ref 134–144)

## 2018-09-20 LAB — CBC WITH DIFFERENTIAL/PLATELET
BASOS ABS: 0 10*3/uL (ref 0.0–0.2)
Basos: 1 %
EOS (ABSOLUTE): 0 10*3/uL (ref 0.0–0.4)
Eos: 1 %
Hematocrit: 40.1 % (ref 37.5–51.0)
Hemoglobin: 13.2 g/dL (ref 13.0–17.7)
IMMATURE GRANULOCYTES: 0 %
Immature Grans (Abs): 0 10*3/uL (ref 0.0–0.1)
LYMPHS ABS: 1 10*3/uL (ref 0.7–3.1)
Lymphs: 16 %
MCH: 30.4 pg (ref 26.6–33.0)
MCHC: 32.9 g/dL (ref 31.5–35.7)
MCV: 92 fL (ref 79–97)
MONOS ABS: 0.5 10*3/uL (ref 0.1–0.9)
Monocytes: 9 %
NEUTROS PCT: 73 %
Neutrophils Absolute: 4.7 10*3/uL (ref 1.4–7.0)
PLATELETS: 151 10*3/uL (ref 150–450)
RBC: 4.34 x10E6/uL (ref 4.14–5.80)
RDW: 13.8 % (ref 12.3–15.4)
WBC: 6.4 10*3/uL (ref 3.4–10.8)

## 2018-09-20 LAB — MICROALBUMIN / CREATININE URINE RATIO
CREATININE, UR: 203.4 mg/dL
MICROALBUM., U, RANDOM: 109.9 ug/mL
Microalb/Creat Ratio: 54 mg/g creat — ABNORMAL HIGH (ref 0.0–30.0)

## 2018-09-20 LAB — PSA: PROSTATE SPECIFIC AG, SERUM: 0.2 ng/mL (ref 0.0–4.0)

## 2018-09-23 ENCOUNTER — Encounter: Payer: Self-pay | Admitting: Family Medicine

## 2018-10-01 ENCOUNTER — Other Ambulatory Visit: Payer: Self-pay | Admitting: Family Medicine

## 2018-10-09 ENCOUNTER — Other Ambulatory Visit: Payer: Self-pay | Admitting: Family Medicine

## 2018-10-19 ENCOUNTER — Ambulatory Visit: Payer: Medicare HMO | Admitting: Family Medicine

## 2018-11-02 ENCOUNTER — Other Ambulatory Visit: Payer: Self-pay | Admitting: Family Medicine

## 2018-11-13 ENCOUNTER — Ambulatory Visit: Payer: Medicare HMO | Admitting: Family Medicine

## 2018-11-28 ENCOUNTER — Encounter: Payer: Self-pay | Admitting: Family Medicine

## 2018-11-28 ENCOUNTER — Ambulatory Visit (INDEPENDENT_AMBULATORY_CARE_PROVIDER_SITE_OTHER): Payer: Medicare HMO | Admitting: Family Medicine

## 2018-11-28 VITALS — BP 134/84 | Wt 159.2 lb

## 2018-11-28 DIAGNOSIS — E7849 Other hyperlipidemia: Secondary | ICD-10-CM

## 2018-11-28 DIAGNOSIS — E119 Type 2 diabetes mellitus without complications: Secondary | ICD-10-CM

## 2018-11-28 DIAGNOSIS — I10 Essential (primary) hypertension: Secondary | ICD-10-CM | POA: Diagnosis not present

## 2018-11-28 NOTE — Progress Notes (Signed)
   Subjective:    Patient ID: Charles Medina, male    DOB: 05-28-1941, 78 y.o.   MRN: [id number]  HPI Pt here today for 4 week follow up. Pt states he is here to have BP checked. Pt states he has had no problems.   Patient for blood pressure check up.  The patient does have hypertension.  The patient is on medication.  Patient relates compliance with meds. Todays BP reviewed with the patient. Patient denies issues with medication. Patient relates reasonable diet. Patient tries to minimize salt. Patient aware of BP goals.  Patient here for follow-up regarding cholesterol.  The patient does have hyperlipidemia.  Patient does try to maintain a reasonable diet.  Patient does take the medication on a regular basis.  Denies missing a dose.  The patient denies any obvious side effects.  Prior blood work results reviewed with the patient.  The patient is aware of his cholesterol goals and the need to keep it under good control to lessen the risk of disease. Patient doing a good job watching diet staying physically active doing a good job taking his medications on a regular basis  Review of Systems  Constitutional: Negative for activity change, fatigue and fever.  HENT: Negative for congestion and rhinorrhea.   Respiratory: Negative for cough and shortness of breath.   Cardiovascular: Negative for chest pain and leg swelling.  Gastrointestinal: Negative for abdominal pain, diarrhea and nausea.  Genitourinary: Negative for dysuria and hematuria.  Neurological: Negative for weakness and headaches.  Psychiatric/Behavioral: Negative for agitation and behavioral problems.       Objective:   Physical Exam Vitals signs reviewed.  Cardiovascular:     Rate and Rhythm: Normal rate and regular rhythm.     Heart sounds: Normal heart sounds. No murmur.  Pulmonary:     Effort: Pulmonary effort is normal.     Breath sounds: Normal breath sounds.  Lymphadenopathy:     Cervical: No cervical adenopathy.    Neurological:     Mental Status: He is alert.  Psychiatric:        Behavior: Behavior normal.           Assessment & Plan:  HTN- Patient was seen today as part of a visit regarding hypertension. The importance of healthy diet and regular physical activity was discussed. The importance of compliance with medications discussed.  Ideal goal is to keep blood pressure low elevated levels certainly below 267/12 when possible.  The patient was counseled that keeping blood pressure under control lessen his risk of complications.  The importance of regular follow-ups was discussed with the patient.  Low-salt diet such as DASH recommended.  Regular physical activity was recommended as well.  Patient was advised to keep regular follow-ups.  His diabetes under good control denies any low sugar spells  We will do comprehensive lab work and follow-up in approximately 5 months sooner if any problems

## 2018-11-28 NOTE — Patient Instructions (Signed)
DASH Eating Plan  DASH stands for "Dietary Approaches to Stop Hypertension." The DASH eating plan is a healthy eating plan that has been shown to reduce high blood pressure (hypertension). It may also reduce your risk for type 2 diabetes, heart disease, and stroke. The DASH eating plan may also help with weight loss.  What are tips for following this plan?    General guidelines   Avoid eating more than 2,300 mg (milligrams) of salt (sodium) a day. If you have hypertension, you may need to reduce your sodium intake to 1,500 mg a day.   Limit alcohol intake to no more than 1 drink a day for nonpregnant women and 2 drinks a day for men. One drink equals 12 oz of beer, 5 oz of wine, or 1 oz of hard liquor.   Work with your health care provider to maintain a healthy body weight or to lose weight. Ask what an ideal weight is for you.   Get at least 30 minutes of exercise that causes your heart to beat faster (aerobic exercise) most days of the week. Activities may include walking, swimming, or biking.   Work with your health care provider or diet and nutrition specialist (dietitian) to adjust your eating plan to your individual calorie needs.  Reading food labels     Check food labels for the amount of sodium per serving. Choose foods with less than 5 percent of the Daily Value of sodium. Generally, foods with less than 300 mg of sodium per serving fit into this eating plan.   To find whole grains, look for the word "whole" as the first word in the ingredient list.  Shopping   Buy products labeled as "low-sodium" or "no salt added."   Buy fresh foods. Avoid canned foods and premade or frozen meals.  Cooking   Avoid adding salt when cooking. Use salt-free seasonings or herbs instead of table salt or sea salt. Check with your health care provider or pharmacist before using salt substitutes.   Do not fry foods. Cook foods using healthy methods such as baking, boiling, grilling, and broiling instead.   Cook with  heart-healthy oils, such as olive, canola, soybean, or sunflower oil.  Meal planning   Eat a balanced diet that includes:  ? 5 or more servings of fruits and vegetables each day. At each meal, try to fill half of your plate with fruits and vegetables.  ? Up to 6-8 servings of whole grains each day.  ? Less than 6 oz of lean meat, poultry, or fish each day. A 3-oz serving of meat is about the same size as a deck of cards. One egg equals 1 oz.  ? 2 servings of low-fat dairy each day.  ? A serving of nuts, seeds, or beans 5 times each week.  ? Heart-healthy fats. Healthy fats called Omega-3 fatty acids are found in foods such as flaxseeds and coldwater fish, like sardines, salmon, and mackerel.   Limit how much you eat of the following:  ? Canned or prepackaged foods.  ? Food that is high in trans fat, such as fried foods.  ? Food that is high in saturated fat, such as fatty meat.  ? Sweets, desserts, sugary drinks, and other foods with added sugar.  ? Full-fat dairy products.   Do not salt foods before eating.   Try to eat at least 2 vegetarian meals each week.   Eat more home-cooked food and less restaurant, buffet, and fast food.     When eating at a restaurant, ask that your food be prepared with less salt or no salt, if possible.  What foods are recommended?  The items listed may not be a complete list. Talk with your dietitian about what dietary choices are best for you.  Grains  Whole-grain or whole-wheat bread. Whole-grain or whole-wheat pasta. Brown rice. Oatmeal. Quinoa. Bulgur. Whole-grain and low-sodium cereals. Pita bread. Low-fat, low-sodium crackers. Whole-wheat flour tortillas.  Vegetables  Fresh or frozen vegetables (raw, steamed, roasted, or grilled). Low-sodium or reduced-sodium tomato and vegetable juice. Low-sodium or reduced-sodium tomato sauce and tomato paste. Low-sodium or reduced-sodium canned vegetables.  Fruits  All fresh, dried, or frozen fruit. Canned fruit in natural juice (without  added sugar).  Meat and other protein foods  Skinless chicken or turkey. Ground chicken or turkey. Pork with fat trimmed off. Fish and seafood. Egg whites. Dried beans, peas, or lentils. Unsalted nuts, nut butters, and seeds. Unsalted canned beans. Lean cuts of beef with fat trimmed off. Low-sodium, lean deli meat.  Dairy  Low-fat (1%) or fat-free (skim) milk. Fat-free, low-fat, or reduced-fat cheeses. Nonfat, low-sodium ricotta or cottage cheese. Low-fat or nonfat yogurt. Low-fat, low-sodium cheese.  Fats and oils  Soft margarine without trans fats. Vegetable oil. Low-fat, reduced-fat, or light mayonnaise and salad dressings (reduced-sodium). Canola, safflower, olive, soybean, and sunflower oils. Avocado.  Seasoning and other foods  Herbs. Spices. Seasoning mixes without salt. Unsalted popcorn and pretzels. Fat-free sweets.  What foods are not recommended?  The items listed may not be a complete list. Talk with your dietitian about what dietary choices are best for you.  Grains  Baked goods made with fat, such as croissants, muffins, or some breads. Dry pasta or rice meal packs.  Vegetables  Creamed or fried vegetables. Vegetables in a cheese sauce. Regular canned vegetables (not low-sodium or reduced-sodium). Regular canned tomato sauce and paste (not low-sodium or reduced-sodium). Regular tomato and vegetable juice (not low-sodium or reduced-sodium). Pickles. Olives.  Fruits  Canned fruit in a light or heavy syrup. Fried fruit. Fruit in cream or butter sauce.  Meat and other protein foods  Fatty cuts of meat. Ribs. Fried meat. Bacon. Sausage. Bologna and other processed lunch meats. Salami. Fatback. Hotdogs. Bratwurst. Salted nuts and seeds. Canned beans with added salt. Canned or smoked fish. Whole eggs or egg yolks. Chicken or turkey with skin.  Dairy  Whole or 2% milk, cream, and half-and-half. Whole or full-fat cream cheese. Whole-fat or sweetened yogurt. Full-fat cheese. Nondairy creamers. Whipped toppings.  Processed cheese and cheese spreads.  Fats and oils  Butter. Stick margarine. Lard. Shortening. Ghee. Bacon fat. Tropical oils, such as coconut, palm kernel, or palm oil.  Seasoning and other foods  Salted popcorn and pretzels. Onion salt, garlic salt, seasoned salt, table salt, and sea salt. Worcestershire sauce. Tartar sauce. Barbecue sauce. Teriyaki sauce. Soy sauce, including reduced-sodium. Steak sauce. Canned and packaged gravies. Fish sauce. Oyster sauce. Cocktail sauce. Horseradish that you find on the shelf. Ketchup. Mustard. Meat flavorings and tenderizers. Bouillon cubes. Hot sauce and Tabasco sauce. Premade or packaged marinades. Premade or packaged taco seasonings. Relishes. Regular salad dressings.  Where to find more information:   National Heart, Lung, and Blood Institute: www.nhlbi.nih.gov   American Heart Association: www.heart.org  Summary   The DASH eating plan is a healthy eating plan that has been shown to reduce high blood pressure (hypertension). It may also reduce your risk for type 2 diabetes, heart disease, and stroke.   With the   DASH eating plan, you should limit salt (sodium) intake to 2,300 mg a day. If you have hypertension, you may need to reduce your sodium intake to 1,500 mg a day.   When on the DASH eating plan, aim to eat more fresh fruits and vegetables, whole grains, lean proteins, low-fat dairy, and heart-healthy fats.   Work with your health care provider or diet and nutrition specialist (dietitian) to adjust your eating plan to your individual calorie needs.  This information is not intended to replace advice given to you by your health care provider. Make sure you discuss any questions you have with your health care provider.  Document Released: 10/13/2011 Document Revised: 10/17/2016 Document Reviewed: 10/17/2016  Elsevier Interactive Patient Education  2019 [employer] Inc[employer]

## 2018-12-04 ENCOUNTER — Other Ambulatory Visit: Payer: Self-pay | Admitting: Family Medicine

## 2019-01-09 ENCOUNTER — Other Ambulatory Visit: Payer: Self-pay | Admitting: Family Medicine

## 2019-04-09 DIAGNOSIS — I10 Essential (primary) hypertension: Secondary | ICD-10-CM | POA: Diagnosis not present

## 2019-04-10 LAB — LIPID PANEL
CHOLESTEROL TOTAL: 103 mg/dL (ref 100–199)
Chol/HDL Ratio: 3.3 ratio (ref 0.0–5.0)
HDL: 31 mg/dL — AB (ref >39)
LDL Calculated: 64 mg/dL (ref 0–99)
TRIGLYCERIDES: 39 mg/dL (ref 0–149)
VLDL Cholesterol Cal: 8 mg/dL (ref 5–40)

## 2019-04-10 LAB — BASIC METABOLIC PANEL
BUN/Creatinine Ratio: 11 (ref 10–24)
BUN: 16 mg/dL (ref 8–27)
CALCIUM: 9.2 mg/dL (ref 8.6–10.2)
CHLORIDE: 105 mmol/L (ref 96–106)
CO2: 23 mmol/L (ref 20–29)
Creatinine, Ser: 1.51 mg/dL — ABNORMAL HIGH (ref 0.76–1.27)
GFR calc Af Amer: 50 mL/min/{1.73_m2} — ABNORMAL LOW (ref >59)
GFR, EST NON AFRICAN AMERICAN: 43 mL/min/{1.73_m2} — AB (ref >59)
GLUCOSE: 106 mg/dL — AB (ref 65–99)
POTASSIUM: 5 mmol/L (ref 3.5–5.2)
Sodium: 142 mmol/L (ref 134–144)

## 2019-04-10 LAB — HEMOGLOBIN A1C
ESTIMATED AVERAGE GLUCOSE: 140 mg/dL
HEMOGLOBIN A1C: 6.5 % — AB (ref 4.8–5.6)

## 2019-04-15 ENCOUNTER — Other Ambulatory Visit: Payer: Self-pay | Admitting: Family Medicine

## 2019-05-01 ENCOUNTER — Other Ambulatory Visit: Payer: Self-pay

## 2019-05-01 ENCOUNTER — Ambulatory Visit (INDEPENDENT_AMBULATORY_CARE_PROVIDER_SITE_OTHER): Payer: Medicare HMO | Admitting: Family Medicine

## 2019-05-01 ENCOUNTER — Ambulatory Visit: Payer: Medicare HMO | Admitting: Family Medicine

## 2019-05-01 DIAGNOSIS — R109 Unspecified abdominal pain: Secondary | ICD-10-CM

## 2019-05-01 DIAGNOSIS — I10 Essential (primary) hypertension: Secondary | ICD-10-CM

## 2019-05-01 DIAGNOSIS — N529 Male erectile dysfunction, unspecified: Secondary | ICD-10-CM | POA: Diagnosis not present

## 2019-05-01 DIAGNOSIS — E7849 Other hyperlipidemia: Secondary | ICD-10-CM

## 2019-05-01 DIAGNOSIS — E119 Type 2 diabetes mellitus without complications: Secondary | ICD-10-CM | POA: Diagnosis not present

## 2019-05-01 DIAGNOSIS — E1121 Type 2 diabetes mellitus with diabetic nephropathy: Secondary | ICD-10-CM | POA: Insufficient documentation

## 2019-05-01 DIAGNOSIS — Z794 Long term (current) use of insulin: Secondary | ICD-10-CM | POA: Insufficient documentation

## 2019-05-01 DIAGNOSIS — E1122 Type 2 diabetes mellitus with diabetic chronic kidney disease: Secondary | ICD-10-CM | POA: Insufficient documentation

## 2019-05-01 MED ORDER — LISINOPRIL 10 MG PO TABS: 10.0000 mg | ORAL_TABLET | Freq: Every day | ORAL | 1 refills | Status: DC

## 2019-05-01 MED ORDER — ATORVASTATIN CALCIUM 80 MG PO TABS: 80.0000 mg | ORAL_TABLET | Freq: Every day | ORAL | 1 refills | Status: DC

## 2019-05-01 MED ORDER — SILDENAFIL CITRATE 20 MG PO TABS: ORAL_TABLET | ORAL | 0 refills | Status: DC

## 2019-05-01 MED ORDER — METFORMIN HCL 1000 MG PO TABS: 500.0000 mg | ORAL_TABLET | Freq: Two times a day (BID) | ORAL | 5 refills | Status: DC

## 2019-05-01 NOTE — Progress Notes (Signed)
Subjective:  Phone visit only  Patient ID: Charles Medina, male    DOB: May 15, 1941, 78 y.o.   MRN: [id number]  Diabetes He presents for his follow-up diabetic visit. He has type 2 diabetes mellitus. Pertinent negatives for hypoglycemia include no confusion, dizziness or headaches. Pertinent negatives for diabetes include no chest pain and no fatigue. Home blood sugar record trend: has not been checking blood sugar since he ran out of test strips.  requesting meter that you can wear on your arm.   Pt states he now weighs 160 lbs and would like to gain back up to 180 lbs. patient unable to workout at the gym therefore he has lost some muscle weight he states his appetite is doing good he denies any rectal bleeding hematuria denies night sweats  Pt wants to get a med for erectile dysfunction.  Patient states he has a young girlfriend and they would like to be able to have relations occasionally he states he would like to try generic of Viagra  Right side has been hurting since riding a [employer], nature to [employer] [place]. Pain started last Friday. Using tylenol, bioflex and heating pain. Pain is doing a little bit better but hurts when he turns a certain way.  He was not having any pain or discomfort before riding the lawnmower for this length of time Virtual Visit via Telephone Note  I connected with Charles Medina on 05/01/19 at  9:30 AM EDT by telephone and verified that I am speaking with the correct person using two identifiers.  Location: Patient: home Provider: office   I discussed the limitations, risks, security and privacy concerns of performing an evaluation and management service by telephone and the availability of in person appointments. I also discussed with the patient that there may be a patient responsible charge related to this service. The patient expressed understanding and agreed to proceed.   History of Present Illness:    Observations/Objective:   Assessment and Plan:    Follow Up Instructions:    I discussed the assessment and treatment plan with the patient. The patient was provided an opportunity to ask questions and all were answered. The patient agreed with the plan and demonstrated an understanding of the instructions.   The patient was advised to call back or seek an in-person evaluation if the symptoms worsen or if the condition fails to improve as anticipated. 25 I provided 25 minutes of non-face-to-face time during this encounter.      Review of Systems  Constitutional: Negative for diaphoresis and fatigue.  HENT: Negative for congestion and rhinorrhea.   Respiratory: Negative for cough and shortness of breath.   Cardiovascular: Negative for chest pain and leg swelling.  Gastrointestinal: Negative for abdominal pain and diarrhea.  Skin: Negative for color change and rash.  Neurological: Negative for dizziness and headaches.  Psychiatric/Behavioral: Negative for behavioral problems and confusion.       Objective:   Physical Exam  Patient had virtual visit Appears to be in no distress Atraumatic Neuro able to relate and oriented No apparent resp distress Color normal       Assessment & Plan:  1. Other hyperlipidemia Recent lab work reviewed no need to do any follow-up labs currently continue medication watch diet  2. Type 2 diabetes mellitus without complication, without long-term current use of insulin (HCC) Diabetes under good control continue medication currently right now renal function is impaired but not bad enough to be off of the metformin  but we will need to watch this I would recommend patient do lab work again in approximately 3 to 4 months if GFR goes down he will need to come off of this medicine  3. Flank pain More likely related into riding the lawnmower causing muscle pains and discomfort stretching exercises recommended  4. Erectile dysfunction, unspecified erectile dysfunction type Sildenafil is something he  could try side effects discussed if he has any problems to let us know  5. Essential hypertension Blood pressure reportedly under good control continue current measures watch diet  6. Diabetic nephropathy associated with type 2 diabetes mellitus ([place]) Has protein in the urine needs to continue on lisinopril will recheck kidney function in 3 to 4 months follow-up office visit in October

## 2019-05-02 NOTE — Progress Notes (Signed)
Orders put in and mailed to pt with a note to do before his visit in october

## 2019-05-02 NOTE — Addendum Note (Signed)
Addended by: Carmelina Noun on: 05/02/2019 05:11 PM   Modules accepted: Orders

## 2019-05-27 ENCOUNTER — Telehealth: Payer: Self-pay | Admitting: Family Medicine

## 2019-05-27 NOTE — Telephone Encounter (Signed)
Please advise. Thank you

## 2019-05-27 NOTE — Telephone Encounter (Signed)
Pt contacted. Pt states he would like to know what kinds of food to eat 3 times a day. States he usually fixes him a sandwich. Pt states he does eat 3 times a day. Pt informed to eat meats, veggies, food that have protein. Pt states he would like to have a chart to know what to eat. Pt would like information mailed to him regarding what he should eat. Pt was transferred up front to set up office visit.

## 2019-05-27 NOTE — Telephone Encounter (Signed)
The difficult part is is that many people as they get older have a difficult time maintaining weight Other individuals tend to gain weight as they get older I would recommend for Charles Medina to be the best he can to eating a robust diet of at least 3 meals a day with snacks I would also recommend the patient do a follow-up office visit within 6 to 8 weeks to see how his progress is Sooner if any problems

## 2019-05-27 NOTE — Telephone Encounter (Signed)
Patient said his kids want him to gain some weight. Says he weighs 160.  Wants advice on how to gain weight.

## 2019-05-30 NOTE — Telephone Encounter (Signed)
Pt contacted and verbalized understanding. Pt states he had [employer] and a banana for breakfast. Pt will keep a diary and bring it by office

## 2019-05-30 NOTE — Telephone Encounter (Signed)
"  Would actually be beneficial for the patient is for him to write down what he typically eats for breakfast lunch and dinner, or do a food diary for several days then drop that by our office so it can be forwarded to my desk for review then I can give him some input regarding ways he could adjust.

## 2019-07-01 ENCOUNTER — Telehealth: Payer: Self-pay | Admitting: Family Medicine

## 2019-07-01 NOTE — Telephone Encounter (Signed)
Patient gave me this paper showing what he is eating.He stated you wanted him to write down what he eats because he needs to gain some weight. Its in your box for review.

## 2019-07-11 NOTE — Telephone Encounter (Signed)
Nurses please let patient know that I did review over his dietary intake It does look like he is doing a good job of eating I do believe that if he added a snack once or twice a day that would help with his weight Peanut butter crackers, yogurt, vegetables with dip, or drinking a Glucerna could be beneficial to helping him gain some weight We will be seeing him in September to recheck on his weight he has an appointment

## 2019-07-11 NOTE — Telephone Encounter (Signed)
Tried to contact patient; someone picked up but then immediately hung up. Will try again

## 2019-07-12 NOTE — Telephone Encounter (Signed)
Left message to  Return call

## 2019-07-16 NOTE — Telephone Encounter (Signed)
Discussed with pt. Pt verbalized understanding.  °

## 2019-07-23 ENCOUNTER — Other Ambulatory Visit: Payer: Self-pay

## 2019-07-23 ENCOUNTER — Ambulatory Visit (INDEPENDENT_AMBULATORY_CARE_PROVIDER_SITE_OTHER): Payer: Medicare HMO | Admitting: Family Medicine

## 2019-07-23 VITALS — BP 158/88 | Temp 97.0°F | Ht 68.5 in | Wt 156.0 lb

## 2019-07-23 DIAGNOSIS — E7849 Other hyperlipidemia: Secondary | ICD-10-CM | POA: Diagnosis not present

## 2019-07-23 DIAGNOSIS — Z8546 Personal history of malignant neoplasm of prostate: Secondary | ICD-10-CM

## 2019-07-23 DIAGNOSIS — Z23 Encounter for immunization: Secondary | ICD-10-CM | POA: Diagnosis not present

## 2019-07-23 DIAGNOSIS — R634 Abnormal weight loss: Secondary | ICD-10-CM

## 2019-07-23 DIAGNOSIS — E119 Type 2 diabetes mellitus without complications: Secondary | ICD-10-CM | POA: Diagnosis not present

## 2019-07-23 DIAGNOSIS — I10 Essential (primary) hypertension: Secondary | ICD-10-CM

## 2019-07-23 DIAGNOSIS — M21619 Bunion of unspecified foot: Secondary | ICD-10-CM | POA: Diagnosis not present

## 2019-07-23 DIAGNOSIS — E1121 Type 2 diabetes mellitus with diabetic nephropathy: Secondary | ICD-10-CM | POA: Diagnosis not present

## 2019-07-23 DIAGNOSIS — M2010 Hallux valgus (acquired), unspecified foot: Secondary | ICD-10-CM

## 2019-07-23 MED ORDER — LISINOPRIL 10 MG PO TABS: 10.0000 mg | ORAL_TABLET | Freq: Every day | ORAL | 1 refills | Status: DC

## 2019-07-23 MED ORDER — METFORMIN HCL 1000 MG PO TABS: 500.0000 mg | ORAL_TABLET | Freq: Two times a day (BID) | ORAL | 5 refills | Status: DC

## 2019-07-23 MED ORDER — ATORVASTATIN CALCIUM 80 MG PO TABS: 80.0000 mg | ORAL_TABLET | Freq: Every day | ORAL | 1 refills | Status: DC

## 2019-07-23 MED ORDER — AMLODIPINE BESYLATE 2.5 MG PO TABS: 2.5000 mg | ORAL_TABLET | Freq: Every day | ORAL | 3 refills | Status: DC

## 2019-07-23 NOTE — Progress Notes (Signed)
Subjective:    Patient ID: Charles Medina, male    DOB: 05-08-1941, 78 y.o.   MRN: [id number]  HPIFollow up on weight. States he eats 3 -4 meals a day and a couple of snacks a day.  Patient is concerned that his weight is gone down over 6 months ago he was 159 now currently 156 he is not been working out in the gym lately because of [place] he does try to eat relatively healthy I did talk with him regarding snacks he could use to boost his weight  The patient was seen today as part of a comprehensive diabetic check up.the patient does have diabetes.  The patient follows here on a regular basis.  The patient relates medication compliance. No significant side effects to the medications. Denies any low glucose spells. Relates compliance with diet to a reasonable level. Patient does do labwork intermittently and understands the dangers of diabetes.  He states his sugars overall been doing okay he does try to be careful with starches Wants flu vaccine.   Patient for blood pressure check up.  The patient does have hypertension.  The patient is on medication.  Patient relates compliance with meds. Todays BP reviewed with the patient. Patient denies issues with medication. Patient relates reasonable diet. Patient tries to minimize salt. Patient aware of BP goals. He is uncertain how his blood pressure is doing but on today's visit it was elevated  Patient here for follow-up regarding cholesterol.  The patient does have hyperlipidemia.  Patient does try to maintain a reasonable diet.  Patient does take the medication on a regular basis.  Denies missing a dose.  The patient denies any obvious side effects.  Prior blood work results reviewed with the patient.  The patient is aware of his cholesterol goals and the need to keep it under good control to lessen the risk of disease. He is try to be careful with his cholesterol previous labs reviewed  History prostate cancer because he is losing some weight we will  recheck PSA   Review of Systems  Constitutional: Negative for diaphoresis and fatigue.  HENT: Negative for congestion and rhinorrhea.   Respiratory: Negative for cough and shortness of breath.   Cardiovascular: Negative for chest pain and leg swelling.  Gastrointestinal: Negative for abdominal pain and diarrhea.  Skin: Negative for color change and rash.  Neurological: Negative for dizziness and headaches.  Psychiatric/Behavioral: Negative for behavioral problems and confusion.       Objective:   Physical Exam Vitals signs reviewed.  Constitutional:      General: He is not in acute distress. HENT:     Head: Normocephalic and atraumatic.  Eyes:     General:        Right eye: No discharge.        Left eye: No discharge.  Neck:     Trachea: No tracheal deviation.  Cardiovascular:     Rate and Rhythm: Normal rate and regular rhythm.     Heart sounds: Normal heart sounds. No murmur.  Pulmonary:     Effort: Pulmonary effort is normal. No respiratory distress.     Breath sounds: Normal breath sounds.  Lymphadenopathy:     Cervical: No cervical adenopathy.  Skin:    General: Skin is warm and dry.  Neurological:     Mental Status: He is alert.     Coordination: Coordination normal.  Psychiatric:        Behavior: Behavior normal.  Assessment & Plan:  1. Need for vaccination Flu shot today - Flu Vaccine QUAD 6+ mos PF IM (Fluarix Quad PF)  2. Type 2 diabetes mellitus without complication, without long-term current use of insulin (HCC) Apparently under good control stick with current measures - CBC with Differential/Platelet - Hepatic function panel - Lipid panel - PSA - Basic metabolic panel  3. Diabetic nephropathy associated with type 2 diabetes mellitus (HCC) Mild nephropathy monitor periodically - CBC with Differential/Platelet - Hepatic function panel - Lipid panel - PSA - Basic metabolic panel  4. Hallux valgus with bunions, unspecified  laterality No signs of ulcers on the feet we did warn him about signs of infection and ulcers - CBC with Differential/Platelet - Hepatic function panel - Lipid panel - PSA - Basic metabolic panel  5. Other hyperlipidemia Continue medication watch diet check lab work previous labs reviewed - CBC with Differential/Platelet - Hepatic function panel - Lipid panel - PSA - Basic metabolic panel  6. History of prostate cancer Because his weight did go down we will check PSA - CBC with Differential/Platelet - Hepatic function panel - Lipid panel - PSA - Basic metabolic panel Weight loss-we will check PSA  Recheck blood pressure in 4 weeks Add low-dose amlodipine to try to help blood pressure  25 minutes was spent with the patient.  This statement verifies that 25 minutes was indeed spent with the patient.  More than 50% of this visit-total duration of the visit-was spent in counseling and coordination of care. The issues that the patient came in for today as reflected in the diagnosis (s) please refer to documentation for further details.

## 2019-08-21 DIAGNOSIS — M2010 Hallux valgus (acquired), unspecified foot: Secondary | ICD-10-CM | POA: Diagnosis not present

## 2019-08-21 DIAGNOSIS — E119 Type 2 diabetes mellitus without complications: Secondary | ICD-10-CM | POA: Diagnosis not present

## 2019-08-21 DIAGNOSIS — E1121 Type 2 diabetes mellitus with diabetic nephropathy: Secondary | ICD-10-CM | POA: Diagnosis not present

## 2019-08-21 DIAGNOSIS — Z8546 Personal history of malignant neoplasm of prostate: Secondary | ICD-10-CM | POA: Diagnosis not present

## 2019-08-21 DIAGNOSIS — M21619 Bunion of unspecified foot: Secondary | ICD-10-CM | POA: Diagnosis not present

## 2019-08-21 DIAGNOSIS — E7849 Other hyperlipidemia: Secondary | ICD-10-CM | POA: Diagnosis not present

## 2019-08-22 LAB — CBC WITH DIFFERENTIAL/PLATELET
Basophils Absolute: 0 10*3/uL (ref 0.0–0.2)
Basos: 1 %
EOS (ABSOLUTE): 0.1 10*3/uL (ref 0.0–0.4)
Eos: 2 %
Hematocrit: 39.1 % (ref 37.5–51.0)
Hemoglobin: 12.7 g/dL — ABNORMAL LOW (ref 13.0–17.7)
Immature Grans (Abs): 0 10*3/uL (ref 0.0–0.1)
Immature Granulocytes: 0 %
Lymphocytes Absolute: 1.1 10*3/uL (ref 0.7–3.1)
Lymphs: 17 %
MCH: 30.3 pg (ref 26.6–33.0)
MCHC: 32.5 g/dL (ref 31.5–35.7)
MCV: 93 fL (ref 79–97)
Monocytes Absolute: 0.5 10*3/uL (ref 0.1–0.9)
Monocytes: 8 %
Neutrophils Absolute: 4.8 10*3/uL (ref 1.4–7.0)
Neutrophils: 72 %
Platelets: 139 10*3/uL — ABNORMAL LOW (ref 150–450)
RBC: 4.19 x10E6/uL (ref 4.14–5.80)
RDW: 13.6 % (ref 11.6–15.4)
WBC: 6.6 10*3/uL (ref 3.4–10.8)

## 2019-08-22 LAB — HEPATIC FUNCTION PANEL
ALT: 25 IU/L (ref 0–44)
AST: 22 IU/L (ref 0–40)
Albumin: 4.4 g/dL (ref 3.7–4.7)
Alkaline Phosphatase: 110 IU/L (ref 39–117)
Bilirubin Total: 0.4 mg/dL (ref 0.0–1.2)
Bilirubin, Direct: 0.1 mg/dL (ref 0.00–0.40)
Total Protein: 7 g/dL (ref 6.0–8.5)

## 2019-08-22 LAB — BASIC METABOLIC PANEL
BUN/Creatinine Ratio: 15 (ref 10–24)
BUN: 21 mg/dL (ref 8–27)
CO2: 24 mmol/L (ref 20–29)
Calcium: 9.7 mg/dL (ref 8.6–10.2)
Chloride: 103 mmol/L (ref 96–106)
Creatinine, Ser: 1.37 mg/dL — ABNORMAL HIGH (ref 0.76–1.27)
GFR calc Af Amer: 56 mL/min/{1.73_m2} — ABNORMAL LOW (ref >59)
GFR calc non Af Amer: 48 mL/min/{1.73_m2} — ABNORMAL LOW (ref >59)
Glucose: 139 mg/dL — ABNORMAL HIGH (ref 65–99)
Potassium: 5.3 mmol/L — ABNORMAL HIGH (ref 3.5–5.2)
Sodium: 141 mmol/L (ref 134–144)

## 2019-08-22 LAB — LIPID PANEL
Chol/HDL Ratio: 3.4 ratio (ref 0.0–5.0)
Cholesterol, Total: 131 mg/dL (ref 100–199)
HDL: 38 mg/dL — ABNORMAL LOW (ref >39)
LDL Chol Calc (NIH): 82 mg/dL (ref 0–99)
Triglycerides: 48 mg/dL (ref 0–149)
VLDL Cholesterol Cal: 11 mg/dL (ref 5–40)

## 2019-08-22 LAB — PSA: Prostate Specific Ag, Serum: 0.4 ng/mL (ref 0.0–4.0)

## 2019-08-26 ENCOUNTER — Other Ambulatory Visit: Payer: Self-pay

## 2019-08-26 ENCOUNTER — Encounter: Payer: Self-pay | Admitting: Family Medicine

## 2019-08-26 ENCOUNTER — Ambulatory Visit (INDEPENDENT_AMBULATORY_CARE_PROVIDER_SITE_OTHER): Payer: Medicare HMO | Admitting: Family Medicine

## 2019-08-26 VITALS — BP 138/84 | Temp 96.5°F | Wt 155.8 lb

## 2019-08-26 DIAGNOSIS — Z1211 Encounter for screening for malignant neoplasm of colon: Secondary | ICD-10-CM | POA: Diagnosis not present

## 2019-08-26 DIAGNOSIS — I10 Essential (primary) hypertension: Secondary | ICD-10-CM

## 2019-08-26 DIAGNOSIS — E119 Type 2 diabetes mellitus without complications: Secondary | ICD-10-CM | POA: Diagnosis not present

## 2019-08-26 LAB — POCT GLYCOSYLATED HEMOGLOBIN (HGB A1C): Hemoglobin A1C: 6.7 % — AB (ref 4.0–5.6)

## 2019-08-26 MED ORDER — AMLODIPINE BESYLATE 2.5 MG PO TABS: 2.5000 mg | ORAL_TABLET | Freq: Every day | ORAL | 5 refills | Status: DC

## 2019-08-26 MED ORDER — SHINGRIX 50 MCG/0.5ML IM SUSR: 0.5000 mL | Freq: Once | INTRAMUSCULAR | 1 refills | Status: AC

## 2019-08-26 NOTE — Progress Notes (Signed)
   Subjective:    Patient ID: Charles Medina, male    DOB: 15-Feb-1941, 78 y.o.   MRN: [id number]  HPI Pt here today for one month follow up. Pt states he is doing well. No issues or concerns today.  This patient has been trying to do better job with eating.  He also is trying to do better making sure he takes an adequate amount of food.  He is taking his medications on a regular basis blood pressure medicine minimizes salt in the diet feels blood pressure been doing well  Diabetes best of his knowledge is been doing well. We did review his lab work with him.  Results for orders placed or performed in visit on 08/26/19  POCT HgB A1C  Result Value Ref Range   Hemoglobin A1C 6.7 (A) 4.0 - 5.6 %   HbA1c POC (<> result, manual entry)     HbA1c, POC (prediabetic range)     HbA1c, POC (controlled diabetic range)     We did do an A1c because his sugar was mildly elevated  Review of Systems  Constitutional: Negative for diaphoresis and fatigue.  HENT: Negative for congestion and rhinorrhea.   Respiratory: Negative for cough and shortness of breath.   Cardiovascular: Negative for chest pain and leg swelling.  Gastrointestinal: Negative for abdominal pain and diarrhea.  Skin: Negative for color change and rash.  Neurological: Negative for dizziness and headaches.  Psychiatric/Behavioral: Negative for behavioral problems and confusion.       Objective:   Physical Exam Vitals signs reviewed.  Constitutional:      General: He is not in acute distress. HENT:     Head: Normocephalic and atraumatic.  Eyes:     General:        Right eye: No discharge.        Left eye: No discharge.  Neck:     Trachea: No tracheal deviation.  Cardiovascular:     Rate and Rhythm: Normal rate and regular rhythm.     Heart sounds: Normal heart sounds. No murmur.  Pulmonary:     Effort: Pulmonary effort is normal. No respiratory distress.     Breath sounds: Normal breath sounds.  Lymphadenopathy:   Cervical: No cervical adenopathy.  Skin:    General: Skin is warm and dry.  Neurological:     Mental Status: He is alert.     Coordination: Coordination normal.  Psychiatric:        Behavior: Behavior normal.           Assessment & Plan:  Diabetes good control continue current measures no immediate changes  We will go ahead and do a stool test for blood.  Blood pressure good control continue current measures minimize salt in the diet  Weight stable.  Patient to follow-up in 5 months sooner problems

## 2019-08-26 NOTE — Patient Instructions (Signed)

## 2019-09-06 ENCOUNTER — Telehealth: Payer: Self-pay | Admitting: *Deleted

## 2019-09-06 ENCOUNTER — Other Ambulatory Visit: Payer: Self-pay | Admitting: *Deleted

## 2019-09-06 DIAGNOSIS — Z1211 Encounter for screening for malignant neoplasm of colon: Secondary | ICD-10-CM

## 2019-09-06 LAB — IFOBT (OCCULT BLOOD): IFOBT: POSITIVE

## 2019-09-06 NOTE — Telephone Encounter (Signed)
Positive hemosure. See result.

## 2019-09-09 NOTE — Telephone Encounter (Signed)
Contacted pt and informed pt of results. Pt verbalized understanding. Referral placed.

## 2019-09-09 NOTE — Telephone Encounter (Signed)
Please see lab result I recommended referral to Dr Laural Golden

## 2019-09-17 ENCOUNTER — Encounter: Payer: Self-pay | Admitting: Family Medicine

## 2019-09-24 ENCOUNTER — Other Ambulatory Visit: Payer: Self-pay | Admitting: Family Medicine

## 2019-10-10 ENCOUNTER — Encounter (INDEPENDENT_AMBULATORY_CARE_PROVIDER_SITE_OTHER): Payer: Self-pay | Admitting: Gastroenterology

## 2019-11-12 DIAGNOSIS — E113213 Type 2 diabetes mellitus with mild nonproliferative diabetic retinopathy with macular edema, bilateral: Secondary | ICD-10-CM | POA: Diagnosis not present

## 2019-11-12 LAB — HM DIABETES EYE EXAM

## 2019-11-20 ENCOUNTER — Encounter (INDEPENDENT_AMBULATORY_CARE_PROVIDER_SITE_OTHER): Payer: Self-pay | Admitting: Gastroenterology

## 2019-11-20 ENCOUNTER — Other Ambulatory Visit: Payer: Self-pay

## 2019-11-20 ENCOUNTER — Ambulatory Visit (INDEPENDENT_AMBULATORY_CARE_PROVIDER_SITE_OTHER): Payer: Medicare HMO | Admitting: Gastroenterology

## 2019-11-20 VITALS — BP 120/72 | HR 104 | Temp 96.8°F | Ht 71.0 in | Wt 149.6 lb

## 2019-11-20 DIAGNOSIS — Z86010 Personal history of colonic polyps: Secondary | ICD-10-CM | POA: Diagnosis not present

## 2019-11-20 DIAGNOSIS — R634 Abnormal weight loss: Secondary | ICD-10-CM

## 2019-11-20 NOTE — Patient Instructions (Signed)
We will contact you when able to schedule colonoscopy with covid restrictions. Please call us in interim w/ any questions or health changes.

## 2019-11-20 NOTE — Progress Notes (Signed)
Patient profile: Charles Medina is a 79 y.o. male seen for evaluation of colon cancer screening. Referred by Dr. Wolfgang Phoenix  History of Present Illness: Charles Medina is seen today to discuss colonoscopy.  He is accompanied by his son who helps with history.  He reports feeling well without any GI symptoms.  He typically has a stool anywhere from every other day to 2 times a day depending on his diet.  He denies any blood in stool or dark stools.  No abdominal pain.  He denies any nausea, vomiting, epigastric pain, GERD.  When specifically asked about his 7 pound weight loss as below over the past 4 months he attributes this to not being able to exercise at the gym due to Covid restrictions.  He specifically denies any early satiety, etc.  Wt Readings from Last 3 Encounters:  11/20/19 149 lb 9.6 oz (67.9 kg)  08/26/19 155 lb 12.8 oz (70.7 kg)  07/23/19 156 lb (70.8 kg)     Last Colonoscopy: 2013-Findings:   Prep excellent. Scattered diverticula throughout the colon. Two small polyps ablated via cold biopsy from ascending colon and submitted together. Another small polyp ablated via cold biopsy from transverse colon. Normal rectal mucosa. Small hemorrhoids above and below the dentate line. pathology with tubular adenomas-5 year repeat recommended    Last Endoscopy: none prior    Past Medical History:  Past Medical History:  Diagnosis Date  . AAA (abdominal aortic aneurysm) ([place])   . Arthritis   . Diabetes mellitus without complication ([place])   . Hypertension   . Prostate cancer ([place])   . Type 2 diabetes mellitus ([place])     Problem List: Patient Active Problem List   Diagnosis Date Noted  . Diabetic nephropathy associated with type 2 diabetes mellitus ([place]) 05/01/2019  . AAA (abdominal aortic aneurysm) ([place]) 06/25/2015  . AAA (abdominal aortic aneurysm) without rupture ([place]) 06/12/2015  . Prostate cancer ([place]) 06/03/2015  . Type 2 diabetes mellitus ([place]) 03/19/2015  . History of  prostate cancer 03/19/2015  . Hallux valgus with bunions 03/19/2015  . Type II or unspecified type diabetes mellitus without mention of complication, uncontrolled 01/28/2013  . Hyperlipidemia 01/28/2013  . Hypertension 01/28/2013    Past Surgical History: Past Surgical History:  Procedure Laterality Date  . ABDOMINAL AORTIC ENDOVASCULAR STENT GRAFT N/A 06/25/2015   Procedure: ABDOMINAL AORTIC ENDOVASCULAR STENT GRAFT;  Surgeon: Conrad Lewes, MD;  Location: [facility];  Service: Vascular;  Laterality: N/A;  . COLONOSCOPY  11/14/2012   Procedure: COLONOSCOPY;  Surgeon: Rogene Houston, MD;  [facility];  Service: Endoscopy;  Laterality: N/A;  830  . PROSTATECTOMY  2011    Allergies: No Known Allergies    Home Medications:  Current Outpatient Medications:  .  amLODipine (NORVASC) 2.5 MG tablet, Take 1 tablet (2.5 mg total) by mouth daily., Disp: 30 tablet, Rfl: 5 .  aspirin 81 MG tablet, Take 81 mg by mouth daily., Disp: , Rfl:  .  atorvastatin (LIPITOR) 80 MG tablet, Take 1 tablet (80 mg total) by mouth daily., Disp: 90 tablet, Rfl: 1 .  lisinopril (ZESTRIL) 10 MG tablet, Take 1 tablet (10 mg total) by mouth daily., Disp: 90 tablet, Rfl: 1 .  metFORMIN (GLUCOPHAGE) 1000 MG tablet, Take 0.5 tablets (500 mg total) by mouth 2 (two) times daily., Disp: 30 tablet, Rfl: 5 .  sildenafil (REVATIO) 20 MG tablet, TAKE 3 TO 5 TABLETS AS NEEDED AND INSTRUCTED., Disp: 30 tablet, Rfl: 4  Family History: Family history is unknown by patient.    Social History:   reports that he quit smoking about 48 years ago. His smoking use included cigarettes. He started smoking about 63 years ago. He has a 11.50 pack-year smoking history. He has never used smokeless tobacco. He reports that he does not drink alcohol or use drugs.   Review of Systems: Constitutional: Denies weight loss/weight gain  Eyes: No changes in vision. ENT: No oral lesions, sore throat.  GI: see HPI.  Heme/Lymph: No easy  bruising.  CV: No chest pain.  GU: No hematuria.  Integumentary: No rashes.  Neuro: No headaches.  Psych: No depression/anxiety.  Endocrine: No heat/cold intolerance.  Allergic/Immunologic: No urticaria.  Resp: No cough, SOB.  Musculoskeletal: No joint swelling.    Physical Examination: BP 120/72 (BP Location: Right Arm, Patient Position: Sitting, Cuff Size: Large)   Pulse (!) 104   Temp (!) 96.8 F (36 C) (Temporal)   Ht 5\' 11"  (1.803 m)   Wt 149 lb 9.6 oz (67.9 kg)   BMI 20.86 kg/m  Gen: NAD, alert and oriented x 4 HEENT: PEERLA, EOMI, Neck: supple, no JVD Chest: CTA bilaterally, no wheezes, crackles, or other adventitious sounds CV: RRR, no m/g/c/r Abd: soft, NT, ND, +BS in all four quadrants; no HSM, guarding, ridigity, or rebound tenderness Ext: no edema, well perfused with 2+ pulses, Skin: no rash or lesions noted on observed skin Lymph: no noted LAD  Data: 08/2019-hemoglobin 12.7, MCV 93, GFR 56, potassium 5.3, glucose 139, creatinine 1.37  08/2019-hemoccult positive.   Assessment/Plan: [name] is a 79 y.o. male  Charles Medina was seen today for new patient (initial visit).  Diagnoses and all orders for this visit:  Personal history of colonic polyps  Loss of weight      1.  History of colon polyps-last colonoscopy 2013 with tubular adenomas and 5-year repeat recommended.  He is now due at this time.  He denies any lower GI symptoms.  He denies a family history of colon polyps colon cancer  2.  Weight loss-incidentally noted 7 pound weight loss compared to September 2020, he specifically denies any early satiety or upper GI symptoms.  Feels loosing weight because he is exercising less with gym closed due to Covid. Alarm symptoms to contact me w/ reviewed. If continues would recommend thyroid, adding EGD, etc. He will monitor.   Patient denies CP, SOB, and use of blood thinners. I discussed the risks and benefits of procedure including bleeding, perforation,  infection, missed lesions, medication reactions and possible hospitalization or surgery if complications. All questions answered. Hx of abdominal aortic endovascular stent graft in 2016.  Addendum - noted after visit has positive hemoccult in Oct 2020.  I also received a call from his son after visit regarding concerns about his weight loss and feeling that pt is not eating well despite patient feeling he is. Would recommend adding on an endoscopy to colonoscopy. I contacted the patient w/ updated recommendations of adding on EGD  EGD/colonoscopy order to be placed in chart when able to schedule w/ covid restrictions.  I personally performed the service, non-incident to. (WP)  Laurine Blazer, [facility]

## 2019-12-11 ENCOUNTER — Encounter (INDEPENDENT_AMBULATORY_CARE_PROVIDER_SITE_OTHER): Payer: Self-pay | Admitting: *Deleted

## 2019-12-11 ENCOUNTER — Other Ambulatory Visit (INDEPENDENT_AMBULATORY_CARE_PROVIDER_SITE_OTHER): Payer: Self-pay | Admitting: *Deleted

## 2019-12-11 DIAGNOSIS — R195 Other fecal abnormalities: Secondary | ICD-10-CM

## 2019-12-11 DIAGNOSIS — R634 Abnormal weight loss: Secondary | ICD-10-CM

## 2019-12-11 DIAGNOSIS — Z860100 Personal history of colon polyps, unspecified: Secondary | ICD-10-CM

## 2019-12-18 ENCOUNTER — Encounter (HOSPITAL_COMMUNITY)
Admission: RE | Admit: 2019-12-18 | Discharge: 2019-12-18 | Disposition: A | Discharge status: Home / Self Care | Payer: Medicare HMO | Source: Ambulatory Visit | Attending: Internal Medicine | Admitting: Internal Medicine

## 2019-12-18 ENCOUNTER — Other Ambulatory Visit (INDEPENDENT_AMBULATORY_CARE_PROVIDER_SITE_OTHER): Payer: Self-pay | Admitting: *Deleted

## 2019-12-18 ENCOUNTER — Other Ambulatory Visit (HOSPITAL_COMMUNITY)
Admission: RE | Admit: 2019-12-18 | Discharge: 2019-12-18 | Disposition: A | Discharge status: Home / Self Care | Payer: Medicare HMO | Source: Ambulatory Visit | Attending: Internal Medicine | Admitting: Internal Medicine

## 2019-12-18 ENCOUNTER — Other Ambulatory Visit: Payer: Self-pay

## 2019-12-18 DIAGNOSIS — Z01812 Encounter for preprocedural laboratory examination: Secondary | ICD-10-CM | POA: Diagnosis not present

## 2019-12-18 DIAGNOSIS — Z20822 Contact with and (suspected) exposure to covid-19: Secondary | ICD-10-CM | POA: Insufficient documentation

## 2019-12-18 LAB — SARS CORONAVIRUS 2 (TAT 6-24 HRS): SARS Coronavirus 2: NEGATIVE

## 2019-12-18 NOTE — Patient Instructions (Signed)
DASHAWN BICKNELL  12/18/2019     @PREFPERIOPPHARMACY @   Your procedure is scheduled on  12/20/2019 .  Report to [facility] at  [phone]  A.M.  Call this number if you have problems the morning of surgery:  [phone]   Remember:  Follow the diet and prep instructions given to you by Dr Olevia Perches office.                    Take these medicines the morning of surgery with A SIP OF WATER Amlodipine    Do not wear jewelry, make-up or nail polish.  Do not wear lotions, powders, or perfumes. Please wear deodorant and brush your teeth.  Do not shave 48 hours prior to surgery.  Men may shave face and neck.  Do not bring valuables to the hospital.  [facility] is not responsible for any belongings or valuables.  Contacts, dentures or bridgework may not be worn into surgery.  Leave your suitcase in the car.  After surgery it may be brought to your room.  For patients admitted to the hospital, discharge time will be determined by your treatment team.  Patients discharged the day of surgery will not be allowed to drive home.   Name and phone number of your driver:   family Special instructions:  DO NOT smoke the morning of your procedure.  Please read over the following fact sheets that you were given. Anesthesia Post-op Instructions and Care and Recovery After Surgery       Upper Endoscopy, Adult, Care After This sheet gives you information about how to care for yourself after your procedure. Your health care provider may also give you more specific instructions. If you have problems or questions, contact your health care provider. What can I expect after the procedure? After the procedure, it is common to have:  A sore throat.  Mild stomach pain or discomfort.  Bloating.  Nausea. Follow these instructions at home:   Follow instructions from your health care provider about what to eat or drink after your procedure.  Return to your normal activities as told by your  health care provider. Ask your health care provider what activities are safe for you.  Take over-the-counter and prescription medicines only as told by your health care provider.  Do not drive for 24 hours if you were given a sedative during your procedure.  Keep all follow-up visits as told by your health care provider. This is important. Contact a health care provider if you have:  A sore throat that lasts longer than one day.  Trouble swallowing. Get help right away if:  You vomit blood or your vomit looks like coffee grounds.  You have: ? A fever. ? Bloody, black, or tarry stools. ? A severe sore throat or you cannot swallow. ? Difficulty breathing. ? Severe pain in your chest or abdomen. Summary  After the procedure, it is common to have a sore throat, mild stomach discomfort, bloating, and nausea.  Do not drive for 24 hours if you were given a sedative during the procedure.  Follow instructions from your health care provider about what to eat or drink after your procedure.  Return to your normal activities as told by your health care provider. This information is not intended to replace advice given to you by your health care provider. Make sure you discuss any questions you have with your health care provider. Document Revised: 04/17/2018 Document  Reviewed: 03/26/2018 Elsevier Patient Education  [place].  Colonoscopy, Adult, Care After This sheet gives you information about how to care for yourself after your procedure. Your health care provider may also give you more specific instructions. If you have problems or questions, contact your health care provider. What can I expect after the procedure? After the procedure, it is common to have:  A small amount of blood in your stool for 24 hours after the procedure.  Some gas.  Mild cramping or bloating of your abdomen. Follow these instructions at home: Eating and drinking   Drink enough fluid to keep  your urine pale yellow.  Follow instructions from your health care provider about eating or drinking restrictions.  Resume your normal diet as instructed by your health care provider. Avoid heavy or fried foods that are hard to digest. Activity  Rest as told by your health care provider.  Avoid sitting for a long time without moving. Get up to take short walks every 1-2 hours. This is important to improve blood flow and breathing. Ask for help if you feel weak or unsteady.  Return to your normal activities as told by your health care provider. Ask your health care provider what activities are safe for you. Managing cramping and bloating   Try walking around when you have cramps or feel bloated.  Apply heat to your abdomen as told by your health care provider. Use the heat source that your health care provider recommends, such as a moist heat pack or a heating pad. ? Place a towel between your skin and the heat source. ? Leave the heat on for 20-30 minutes. ? Remove the heat if your skin turns bright red. This is especially important if you are unable to feel pain, heat, or cold. You may have a greater risk of getting burned. General instructions  For the first 24 hours after the procedure: ? Do not drive or use machinery. ? Do not sign important documents. ? Do not drink alcohol. ? Do your regular daily activities at a slower pace than normal. ? Eat soft foods that are easy to digest.  Take over-the-counter and prescription medicines only as told by your health care provider.  Keep all follow-up visits as told by your health care provider. This is important. Contact a health care provider if:  You have blood in your stool 2-3 days after the procedure. Get help right away if you have:  More than a small spotting of blood in your stool.  Large blood clots in your stool.  Swelling of your abdomen.  Nausea or vomiting.  A fever.  Increasing pain in your abdomen that is not  relieved with medicine. Summary  After the procedure, it is common to have a small amount of blood in your stool. You may also have mild cramping and bloating of your abdomen.  For the first 24 hours after the procedure, do not drive or use machinery, sign important documents, or drink alcohol.  Get help right away if you have a lot of blood in your stool, nausea or vomiting, a fever, or increased pain in your abdomen. This information is not intended to replace advice given to you by your health care provider. Make sure you discuss any questions you have with your health care provider. Document Revised: 05/20/2019 Document Reviewed: 05/20/2019 Elsevier Patient Education  [place] After These instructions provide you with information about caring for yourself after your procedure.  Your health care provider may also give you more specific instructions. Your treatment has been planned according to current medical practices, but problems sometimes occur. Call your health care provider if you have any problems or questions after your procedure. What can I expect after the procedure? After your procedure, you may:  Feel sleepy for several hours.  Feel clumsy and have poor balance for several hours.  Feel forgetful about what happened after the procedure.  Have poor judgment for several hours.  Feel nauseous or vomit.  Have a sore throat if you had a breathing tube during the procedure. Follow these instructions at home: For at least 24 hours after the procedure:      Have a responsible adult stay with you. It is important to have someone help care for you until you are awake and alert.  Rest as needed.  Do not: ? Participate in activities in which you could fall or become injured. ? Drive. ? Use heavy machinery. ? Drink alcohol. ? Take sleeping pills or medicines that cause drowsiness. ? Make important decisions or sign legal  documents. ? Take care of children on your own. Eating and drinking  Follow the diet that is recommended by your health care provider.  If you vomit, drink water, juice, or soup when you can drink without vomiting.  Make sure you have little or no nausea before eating solid foods. General instructions  Take over-the-counter and prescription medicines only as told by your health care provider.  If you have sleep apnea, surgery and certain medicines can increase your risk for breathing problems. Follow instructions from your health care provider about wearing your sleep device: ? Anytime you are sleeping, including during daytime naps. ? While taking prescription pain medicines, sleeping medicines, or medicines that make you drowsy.  If you smoke, do not smoke without supervision.  Keep all follow-up visits as told by your health care provider. This is important. Contact a health care provider if:  You keep feeling nauseous or you keep vomiting.  You feel light-headed.  You develop a rash.  You have a fever. Get help right away if:  You have trouble breathing. Summary  For several hours after your procedure, you may feel sleepy and have poor judgment.  Have a responsible adult stay with you for at least 24 hours or until you are awake and alert. This information is not intended to replace advice given to you by your health care provider. Make sure you discuss any questions you have with your health care provider. Document Revised: 01/22/2018 Document Reviewed: 02/14/2016 Elsevier Patient Education  [place].

## 2019-12-20 ENCOUNTER — Encounter (HOSPITAL_COMMUNITY)
Admission: RE | Disposition: A | Discharge status: Home / Self Care | Payer: Self-pay | Source: Home / Self Care | Attending: Internal Medicine

## 2019-12-20 ENCOUNTER — Ambulatory Visit (HOSPITAL_COMMUNITY): Payer: Medicare HMO | Admitting: Anesthesiology

## 2019-12-20 ENCOUNTER — Encounter (HOSPITAL_COMMUNITY): Payer: Self-pay | Admitting: Internal Medicine

## 2019-12-20 ENCOUNTER — Ambulatory Visit (HOSPITAL_COMMUNITY)
Admission: RE | Admit: 2019-12-20 | Discharge: 2019-12-20 | Disposition: A | Discharge status: Home / Self Care | Payer: Medicare HMO | Attending: Internal Medicine | Admitting: Internal Medicine

## 2019-12-20 DIAGNOSIS — D123 Benign neoplasm of transverse colon: Secondary | ICD-10-CM | POA: Insufficient documentation

## 2019-12-20 DIAGNOSIS — K2270 Barrett's esophagus without dysplasia: Secondary | ICD-10-CM | POA: Insufficient documentation

## 2019-12-20 DIAGNOSIS — I10 Essential (primary) hypertension: Secondary | ICD-10-CM | POA: Insufficient documentation

## 2019-12-20 DIAGNOSIS — K3189 Other diseases of stomach and duodenum: Secondary | ICD-10-CM | POA: Diagnosis not present

## 2019-12-20 DIAGNOSIS — K5730 Diverticulosis of large intestine without perforation or abscess without bleeding: Secondary | ICD-10-CM | POA: Insufficient documentation

## 2019-12-20 DIAGNOSIS — E119 Type 2 diabetes mellitus without complications: Secondary | ICD-10-CM | POA: Insufficient documentation

## 2019-12-20 DIAGNOSIS — R634 Abnormal weight loss: Secondary | ICD-10-CM | POA: Insufficient documentation

## 2019-12-20 DIAGNOSIS — Z8546 Personal history of malignant neoplasm of prostate: Secondary | ICD-10-CM | POA: Diagnosis not present

## 2019-12-20 DIAGNOSIS — Z79899 Other long term (current) drug therapy: Secondary | ICD-10-CM | POA: Insufficient documentation

## 2019-12-20 DIAGNOSIS — Z87891 Personal history of nicotine dependence: Secondary | ICD-10-CM | POA: Insufficient documentation

## 2019-12-20 DIAGNOSIS — Z6824 Body mass index (BMI) 24.0-24.9, adult: Secondary | ICD-10-CM | POA: Diagnosis not present

## 2019-12-20 DIAGNOSIS — Z7982 Long term (current) use of aspirin: Secondary | ICD-10-CM | POA: Diagnosis not present

## 2019-12-20 DIAGNOSIS — Z7984 Long term (current) use of oral hypoglycemic drugs: Secondary | ICD-10-CM | POA: Insufficient documentation

## 2019-12-20 DIAGNOSIS — D131 Benign neoplasm of stomach: Secondary | ICD-10-CM | POA: Insufficient documentation

## 2019-12-20 DIAGNOSIS — R195 Other fecal abnormalities: Secondary | ICD-10-CM

## 2019-12-20 DIAGNOSIS — K228 Other specified diseases of esophagus: Secondary | ICD-10-CM | POA: Diagnosis not present

## 2019-12-20 DIAGNOSIS — Z86010 Personal history of colonic polyps: Secondary | ICD-10-CM | POA: Insufficient documentation

## 2019-12-20 DIAGNOSIS — K635 Polyp of colon: Secondary | ICD-10-CM | POA: Diagnosis not present

## 2019-12-20 HISTORY — PX: POLYPECTOMY: SHX5525

## 2019-12-20 HISTORY — PX: ESOPHAGOGASTRODUODENOSCOPY (EGD) WITH PROPOFOL: SHX5813

## 2019-12-20 HISTORY — PX: COLONOSCOPY WITH PROPOFOL: SHX5780

## 2019-12-20 HISTORY — PX: BIOPSY: SHX5522

## 2019-12-20 LAB — GLUCOSE, CAPILLARY
Glucose-Capillary: 160 mg/dL — ABNORMAL HIGH (ref 70–99)
Glucose-Capillary: 144 mg/dL — ABNORMAL HIGH (ref 70–99)

## 2019-12-20 SURGERY — COLONOSCOPY WITH PROPOFOL
Anesthesia: General

## 2019-12-20 MED ORDER — LIDOCAINE HCL (CARDIAC) PF 50 MG/5ML IV SOSY
PREFILLED_SYRINGE | INTRAVENOUS | Status: DC | PRN
  Administered 2019-12-20: 60 mg via INTRAVENOUS

## 2019-12-20 MED ORDER — LACTATED RINGERS IV SOLN: INTRAVENOUS | Status: DC | PRN

## 2019-12-20 MED ORDER — LACTATED RINGERS IV SOLN
Freq: Once | INTRAVENOUS | Status: AC
  Administered 2019-12-20: 1000 mL via INTRAVENOUS

## 2019-12-20 MED ORDER — KETAMINE HCL 50 MG/5ML IJ SOSY
PREFILLED_SYRINGE | INTRAMUSCULAR | Status: AC
  Filled 2019-12-20: qty 5

## 2019-12-20 MED ORDER — PROPOFOL 10 MG/ML IV BOLUS
INTRAVENOUS | Status: DC | PRN
  Administered 2019-12-20 (×3): 20 mg via INTRAVENOUS

## 2019-12-20 MED ORDER — CHLORHEXIDINE GLUCONATE CLOTH 2 % EX PADS: 6.0000 | MEDICATED_PAD | Freq: Once | CUTANEOUS | Status: DC

## 2019-12-20 MED ORDER — PROPOFOL 500 MG/50ML IV EMUL
INTRAVENOUS | Status: DC | PRN
  Administered 2019-12-20: 100 ug/kg/min via INTRAVENOUS
  Administered 2019-12-20: 200 ug/kg/min via INTRAVENOUS

## 2019-12-20 MED ORDER — GLYCOPYRROLATE 0.2 MG/ML IJ SOLN
INTRAMUSCULAR | Status: DC | PRN
  Administered 2019-12-20: .2 mg via INTRAVENOUS

## 2019-12-20 MED ORDER — KETAMINE HCL 10 MG/ML IJ SOLN
INTRAMUSCULAR | Status: DC | PRN
  Administered 2019-12-20: 10 mg via INTRAVENOUS

## 2019-12-20 NOTE — Op Note (Signed)
[facility] Patient Name: Charles Medina Procedure Date: 12/20/2019 9:53 AM MRN: [id number] Date of Birth: [birth year] Attending MD: Hildred Laser , MD CSN: [id number] Age: 79 Admit Type: Outpatient Procedure:                Colonoscopy Indications:              Heme positive stool; history of polyps Providers:                Hildred Laser, MD, [place]is Peak B. Sharon Seller, RN, Randa Spike, Technician Referring MD:              Medicines:                Propofol per Anesthesia Complications:            No immediate complications. Estimated Blood Loss:     Estimated blood loss was minimal. Procedure:                Pre-Anesthesia Assessment:                           - Prior to the procedure, a History and Physical                            was performed, and patient medications and                            allergies were reviewed. The patient's tolerance of                            previous anesthesia was also reviewed. The risks                            and benefits of the procedure and the sedation                            options and risks were discussed with the patient.                            All questions were answered, and informed consent                            was obtained. Prior Anticoagulants: The patient has                            taken no previous anticoagulant or antiplatelet                            agents. ASA Grade Assessment: II - A patient with                            mild systemic disease. After reviewing the risks  and benefits, the patient was deemed in                            satisfactory condition to undergo the procedure.                           After obtaining informed consent, the colonoscope                            was passed under direct vision. Throughout the                            procedure, the patient's blood pressure, pulse, and                            oxygen  saturations were monitored continuously. The                            [id number]-[id number]) scope was introduced through                            the anus and advanced to the the cecum, identified                            by appendiceal orifice and ileocecal valve. The                            colonoscopy was performed without difficulty. The                            patient tolerated the procedure well. The quality                            of the bowel preparation was adequate to identify                            polyps. The ileocecal valve, appendiceal orifice,                            and rectum were photographed. Scope In: 9:55:34 AM Scope Out: 10:22:28 AM Scope Withdrawal Time: 0 hours 14 minutes 6 seconds  Total Procedure Duration: 0 hours 26 minutes 54 seconds  Findings:      The perianal and digital rectal examinations were normal.      A 4 mm polyp was found in the hepatic flexure. The polyp was sessile.       The polyp was removed with a cold snare. Resection and retrieval were       complete. The pathology specimen was placed into Bottle Number 2.      A 11 mm polyp was found in the hepatic flexure. The polyp was       multi-lobulated. The polyp was removed with a cold snare. Resection and       retrieval were complete. The pathology specimen was placed into Bottle       Number 2. To stop active  bleeding, two hemostatic clips were       successfully placed (MR conditional). There was no bleeding at the end       of the procedure.      Scattered diverticula were found in the sigmoid colon and hepatic       flexure.      The retroflexed view of the distal rectum and anal verge was normal and       showed no anal or rectal abnormalities. Impression:               - One 4 mm polyp at the hepatic flexure, removed                            with a cold snare. Resected and retrieved.                           - One 11 mm polyp at the hepatic flexure, removed                             with a cold snare. Resected and retrieved. Clips                            (MR conditional) were placed.                           - Diverticulosis in the sigmoid colon and at the                            hepatic flexure. Moderate Sedation:      Per Anesthesia Care Recommendation:           - Patient has a contact number available for                            emergencies. The signs and symptoms of potential                            delayed complications were discussed with the                            patient. Return to normal activities tomorrow.                            Written discharge instructions were provided to the                            patient.                           - High fiber diet and diabetic (ADA) diet today.                           - Continue present medications.                           - No aspirin, ibuprofen, naproxen, or other  non-steroidal anti-inflammatory drugs for 3 days.                           - Await pathology results.                           - No MRI until clips have passed.                           - No recommendation at this time regarding repeat                            colonoscopy. Procedure Code(s):        --- Professional ---                           [phone], Colonoscopy, flexible; with removal of                            tumor(s), polyp(s), or other lesion(s) by snare                            technique Diagnosis Code(s):        --- Professional ---                           K63.5, Polyp of colon                           R19.5, Other fecal abnormalities                           K57.30, Diverticulosis of large intestine without                            perforation or abscess without bleeding CPT copyright 2019 American Medical Association. All rights reserved. The codes documented in this report are preliminary and upon coder review may  be revised to meet current  compliance requirements. Hildred Laser, MD Hildred Laser, MD 12/20/2019 10:40:20 AM This report has been signed electronically. Number of Addenda: 0

## 2019-12-20 NOTE — Op Note (Addendum)
[facility] Patient Name: Charles Medina Procedure Date: 12/20/2019 9:09 AM MRN: [id number] Date of Birth: [birth year] Attending MD: Hildred Laser , MD CSN: [id number] Age: 79 Admit Type: Outpatient Procedure:                Upper GI endoscopy Indications:              Heme positive stool, Weight loss Providers:                Hildred Laser, MD, Otis Peak B. Sharon Seller, RN, Randa Spike, Technician Referring MD:             Delphina Cahill, MD Medicines:                Propofol per Anesthesia Complications:            No immediate complications. Estimated Blood Loss:     Estimated blood loss was minimal. Procedure:                Pre-Anesthesia Assessment:                           - Prior to the procedure, a History and Physical                            was performed, and patient medications and                            allergies were reviewed. The patient's tolerance of                            previous anesthesia was also reviewed. The risks                            and benefits of the procedure and the sedation                            options and risks were discussed with the patient.                            All questions were answered, and informed consent                            was obtained. Prior Anticoagulants: The patient has                            taken no previous anticoagulant or antiplatelet                            agents. ASA Grade Assessment: II - A patient with                            mild systemic disease. After reviewing the risks  and benefits, the patient was deemed in                            satisfactory condition to undergo the procedure.                           After obtaining informed consent, the endoscope was                            passed under direct vision. Throughout the                            procedure, the patient's blood pressure, pulse, and   oxygen saturations were monitored continuously. The                            [id number]) scope was introduced through the                            mouth, and advanced to the second part of duodenum.                            The upper GI endoscopy was accomplished without                            difficulty. The patient tolerated the procedure                            well. Scope In: 9:41:25 AM Scope Out: 9:51:31 AM Total Procedure Duration: 0 hours 10 minutes 6 seconds  Findings:      The examined esophagus was normal.      The Z-line was irregular and was found 42 cm from the incisors. Biopsies       were taken with a cold forceps for histology. The pathology specimen was       placed into Bottle Number 1.      A small, submucosal, non-circumferential mass with no bleeding and no       stigmata of recent bleeding was found in the prepyloric region of the       stomach and at the pylorus.      Spastic pylorus.      The exam of the stomach was otherwise normal.      The duodenal bulb and second portion of the duodenum were normal. Impression:               - Normal esophagus.                           - Z-line irregular, 42 cm from the incisors.                            Biopsied.                           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                           - Spastic but patent pylorus                           - Normal duodenal bulb and second portion of the                            duodenum. Moderate Sedation:      Per Anesthesia Care Recommendation:           - Patient has a contact number available for                            emergencies. The signs and symptoms of potential                            delayed complications were discussed with the                            patient. Return to normal activities tomorrow.                            Written discharge instructions  were provided to the                            patient.                           - Resume previous diet today.                           - Continue present medications.                           - No aspirin, ibuprofen, naproxen, or other                            non-steroidal anti-inflammatory drugs for 3 days.                           - Await pathology results.                           - Repeat upper endoscopy in 1 year. Procedure Code(s):        --- Professional ---                           [id number], Esophagogastroduodenoscopy, flexible,                            transoral; with biopsy, single or multiple Diagnosis Code(s):        --- Professional ---                           K22.8, Other specified diseases of esophagus  D13.1, Benign neoplasm of stomach                           K31.89, Other diseases of stomach and duodenum                           R19.5, Other fecal abnormalities                           R63.4, Abnormal weight loss CPT copyright 2019 American Medical Association. All rights reserved. The codes documented in this report are preliminary and upon coder review may  be revised to meet current compliance requirements. Hildred Laser, MD Hildred Laser, MD 12/20/2019 10:34:32 AM This report has been signed electronically. Number of Addenda: 0

## 2019-12-20 NOTE — Anesthesia Preprocedure Evaluation (Signed)
Anesthesia Evaluation  Patient identified by MRN, date of birth, ID band Patient awake    Reviewed: Allergy & Precautions, NPO status , Patient's Chart, lab work & pertinent test results  History of Anesthesia Complications Negative for: history of anesthetic complications  Airway Mallampati: II  TM Distance: >3 FB Neck ROM: Full    Dental  (+) Edentulous Lower, Edentulous Upper   Pulmonary former smoker,    Pulmonary exam normal breath sounds clear to auscultation       Cardiovascular Exercise Tolerance: Good hypertension, Pt. on medications Normal cardiovascular exam Rhythm:Regular Rate:Normal  Sinus  Rhythm  -Old anteroseptal infarct.   -  Nonspecific T-abnormality.   ABNORMAL   Neuro/Psych negative neurological ROS  negative psych ROS   GI/Hepatic negative GI ROS, Neg liver ROS,   Endo/Other  diabetes, Well Controlled, Type 2, Oral Hypoglycemic Agents  Renal/GU Renal disease     Musculoskeletal  (+) Arthritis , Osteoarthritis,    Abdominal   Peds  Hematology negative hematology ROS (+)   Anesthesia Other Findings   Reproductive/Obstetrics                             Anesthesia Physical Anesthesia Plan  ASA: III  Anesthesia Plan: General   Post-op Pain Management:    Induction: Intravenous  PONV Risk Score and Plan: TIVA and Treatment may vary due to age or medical condition  Airway Management Planned: Nasal Cannula, Natural Airway and Simple Face Mask  Additional Equipment:   Intra-op Plan:   Post-operative Plan:   Informed Consent: I have reviewed the patients History and Physical, chart, labs and discussed the procedure including the risks, benefits and alternatives for the proposed anesthesia with the patient or authorized representative who has indicated his/her understanding and acceptance.       Plan Discussed with: CRNA and Surgeon  Anesthesia Plan  Comments:         Anesthesia Quick Evaluation

## 2019-12-20 NOTE — Anesthesia Procedure Notes (Signed)
Date/Time: 12/20/2019 9:33 AM Performed by: Vista Deck, CRNA Pre-anesthesia Checklist: Patient identified, Emergency Drugs available, Suction available, Timeout performed and Patient being monitored Patient Re-evaluated:Patient Re-evaluated prior to induction Oxygen Delivery Method: Non-rebreather mask

## 2019-12-20 NOTE — H&P (Signed)
Charles Medina is an 79 y.o. male.   Chief Complaint: Patient is here for esophagogastroduodenoscopy and colonoscopy. HPI: Patient is 79 year old Afro-American male who has history of colonic polyps who was noted to have heme positive stool back in October 2020.  He denies melena or rectal bleeding.  He states he has lost about 10 pounds since last year.  He states he has a good appetite.  He denies heartburn nausea vomiting or dysphagia.  He is on low-dose aspirin and does not take any other OTC NSAIDs.  He says lately he has been going to the gym at least 3 times per week and sometimes he goes daily. Family history is negative for CRC.  Past Medical History:  Diagnosis Date  . AAA (abdominal aortic aneurysm) ([place])   . Arthritis   . Diabetes mellitus without complication ([place])   . Hypertension   . Prostate cancer ([place])   . Type 2 diabetes mellitus ([place])     Past Surgical History:  Procedure Laterality Date  . ABDOMINAL AORTIC ENDOVASCULAR STENT GRAFT N/A 06/25/2015   Procedure: ABDOMINAL AORTIC ENDOVASCULAR STENT GRAFT;  Surgeon: Conrad Upham, MD;  Location: [facility];  Service: Vascular;  Laterality: N/A;  . COLONOSCOPY  11/14/2012   Procedure: COLONOSCOPY;  Surgeon: Rogene Houston, MD;  [facility];  Service: Endoscopy;  Laterality: N/A;  830  . PROSTATECTOMY  2011    Family History  Family history unknown: Yes   Social History:  reports that he quit smoking about 48 years ago. His smoking use included cigarettes. He started smoking about 63 years ago. He has a 11.50 pack-year smoking history. He has never used smokeless tobacco. He reports that he does not drink alcohol or use drugs.  Allergies: No Known Allergies  Medications Prior to Admission  Medication Sig Dispense Refill  . amLODipine (NORVASC) 2.5 MG tablet Take 1 tablet (2.5 mg total) by mouth daily. 30 tablet 5  . aspirin 81 MG tablet Take 81 mg by mouth daily.    Marland Kitchen atorvastatin (LIPITOR) 80 MG tablet Take 1 tablet  (80 mg total) by mouth daily. 90 tablet 1  . lisinopril (ZESTRIL) 10 MG tablet Take 1 tablet (10 mg total) by mouth daily. 90 tablet 1  . metFORMIN (GLUCOPHAGE) 1000 MG tablet Take 0.5 tablets (500 mg total) by mouth 2 (two) times daily. 30 tablet 5  . Multiple Vitamins-Minerals (EMERGEN-C IMMUNE PO) Take 1 packet by mouth daily as needed (immune health/energy).    . sildenafil (REVATIO) 20 MG tablet TAKE 3 TO 5 TABLETS AS NEEDED AND INSTRUCTED. (Patient taking differently: Take 60-100 mg by mouth daily as needed (erectile dysfunction.). TAKE 3 TO 5 TABLETS AS NEEDED AND INSTRUCTED.) 30 tablet 4    Results for orders placed or performed during the hospital encounter of 12/20/19 (from the past 48 hour(s))  Glucose, capillary     Status: Abnormal   Collection Time: 12/20/19  8:52 AM  Result Value Ref Range   Glucose-Capillary 160 (H) 70 - 99 mg/dL   No results found.  Review of Systems  Blood pressure (!) 177/93, temperature 98.4 F (36.9 C), temperature source Oral, resp. rate 20, height 5\' 11"  (1.803 m), weight 79.4 kg, SpO2 97 %. Physical Exam  Constitutional:  Well-developed thin male in NAD.  HENT:  Mouth/Throat: Oropharynx is clear and moist.  Eyes: Conjunctivae are normal. No scleral icterus.  Neck: No thyromegaly present.  Cardiovascular: Normal rate, regular rhythm and normal heart sounds.  No murmur heard. Respiratory: Effort normal and breath sounds normal.  GI:  Abdomen is symmetrical.  He has small scar to the left of umbilicus along with other laparoscopy scars from prior prostate surgery.  On palpation abdomen is soft and nontender without organomegaly or masses.  Musculoskeletal:        General: No edema.  Lymphadenopathy:    He has no cervical adenopathy.  Neurological: He is alert.  Skin: Skin is warm and dry.     Assessment/Plan Heme positive stools and history of colonic adenomas. Weight loss.  Wonder if weight loss is contrary to increase physical  activity. Diagnostic esophagogastroduodenoscopy and colonoscopy.  Hildred Laser, MD 12/20/2019, 9:28 AM

## 2019-12-20 NOTE — Discharge Instructions (Addendum)
Upper Endoscopy, Adult, Care After This sheet gives you information about how to care for yourself after your procedure. Your health care provider may also give you more specific instructions. If you have problems or questions, contact your health care provider. What can I expect after the procedure? After the procedure, it is common to have:  A sore throat.  Mild stomach pain or discomfort.  Bloating.  Nausea. Follow these instructions at home:   Follow instructions from your health care provider about what to eat or drink after your procedure.  Return to your normal activities as told by your health care provider. Ask your health care provider what activities are safe for you.  Take over-the-counter and prescription medicines only as told by your health care provider.  Do not drive for 24 hours if you were given a sedative during your procedure.  Keep all follow-up visits as told by your health care provider. This is important. Contact a health care provider if you have:  A sore throat that lasts longer than one day.  Trouble swallowing. Get help right away if:  You vomit blood or your vomit looks like coffee grounds.  You have: ? A fever. ? Bloody, black, or tarry stools. ? A severe sore throat or you cannot swallow. ? Difficulty breathing. ? Severe pain in your chest or abdomen. Summary  After the procedure, it is common to have a sore throat, mild stomach discomfort, bloating, and nausea.  Do not drive for 24 hours if you were given a sedative during the procedure.  Follow instructions from your health care provider about what to eat or drink after your procedure.  Return to your normal activities as told by your health care provider. This information is not intended to replace advice given to you by your health care provider. Make sure you discuss any questions you have with your health care provider. Document Revised: 04/17/2018 Document Reviewed:  03/26/2018 Elsevier Patient Education  [place]. Colonoscopy, Adult, Care After This sheet gives you information about how to care for yourself after your procedure. Your doctor may also give you more specific instructions. If you have problems or questions, call your doctor. What can I expect after the procedure? After the procedure, it is common to have:  A small amount of blood in your poop (stool) for 24 hours.  Some gas.  Mild cramping or bloating in your belly (abdomen). Follow these instructions at home: Eating and drinking   Drink enough fluid to keep your pee (urine) pale yellow.  Follow instructions from your doctor about what you cannot eat or drink.  Return to your normal diet as told by your doctor. Avoid heavy or fried foods that are hard to digest. Activity  Rest as told by your doctor.  Do not sit for a long time without moving. Get up to take short walks every 1-2 hours. This is important. Ask for help if you feel weak or unsteady.  Return to your normal activities as told by your doctor. Ask your doctor what activities are safe for you. To help cramping and bloating:   Try walking around.  Put heat on your belly as told by your doctor. Use the heat source that your doctor recommends, such as a moist heat pack or a heating pad. ? Put a towel between your skin and the heat source. ? Leave the heat on for 20-30 minutes. ? Remove the heat if your skin turns bright red. This is very important  if you are unable to feel pain, heat, or cold. You may have a greater risk of getting burned. General instructions  For the first 24 hours after the procedure: ? Do not drive or use machinery. ? Do not sign important documents. ? Do not drink alcohol. ? Do your daily activities more slowly than normal. ? Eat foods that are soft and easy to digest.  Take over-the-counter or prescription medicines only as told by your doctor.  Keep all follow-up visits as told  by your doctor. This is important. Contact a doctor if:  You have blood in your poop 2-3 days after the procedure. Get help right away if:  You have more than a small amount of blood in your poop.  You see large clumps of tissue (blood clots) in your poop.  Your belly is swollen.  You feel like you may vomit (nauseous).  You vomit.  You have a fever.  You have belly pain that gets worse, and medicine does not help your pain. Summary  After the procedure, it is common to have a small amount of blood in your poop. You may also have mild cramping and bloating in your belly.  For the first 24 hours after the procedure, do not drive or use machinery, do not sign important documents, and do not drink alcohol.  Get help right away if you have a lot of blood in your poop, feel like you may vomit, have a fever, or have more belly pain. This information is not intended to replace advice given to you by your health care provider. Make sure you discuss any questions you have with your health care provider. Document Revised: 05/20/2019 Document Reviewed: 05/20/2019 Elsevier Patient Education  [place]. Colon Polyps  Polyps are tissue growths inside the body. Polyps can grow in many places, including the large intestine (colon). A polyp may be a round bump or a mushroom-shaped growth. You could have one polyp or several. Most colon polyps are noncancerous (benign). However, some colon polyps can become cancerous over time. Finding and removing the polyps early can help prevent this. What are the causes? The exact cause of colon polyps is not known. What increases the risk? You are more likely to develop this condition if you:  Have a family history of colon cancer or colon polyps.  Are older than 51 or older than 45 if you are African American.  Have inflammatory bowel disease, such as ulcerative colitis or Crohn's disease.  Have certain hereditary conditions, such  as: ? Familial adenomatous polyposis. ? Lynch syndrome. ? Turcot syndrome. ? Peutz-Jeghers syndrome.  Are overweight.  Smoke cigarettes.  Do not get enough exercise.  Drink too much alcohol.  Eat a diet that is high in fat and red meat and low in fiber.  Had childhood cancer that was treated with abdominal radiation. What are the signs or symptoms? Most polyps do not cause symptoms. If you have symptoms, they may include:  Blood coming from your rectum when having a bowel movement.  Blood in your stool. The stool may look dark red or black.  Abdominal pain.  A change in bowel habits, such as constipation or diarrhea. How is this diagnosed? This condition is diagnosed with a colonoscopy. This is a procedure in which a lighted, flexible scope is inserted into the anus and then passed into the colon to examine the area. Polyps are sometimes found when a colonoscopy is done as part of routine cancer screening tests.  How is this treated? Treatment for this condition involves removing any polyps that are found. Most polyps can be removed during a colonoscopy. Those polyps will then be tested for cancer. Additional treatment may be needed depending on the results of testing. Follow these instructions at home: Lifestyle  Maintain a healthy weight, or lose weight if recommended by your health care provider.  Exercise every day or as told by your health care provider.  Do not use any products that contain nicotine or tobacco, such as cigarettes and e-cigarettes. If you need help quitting, ask your health care provider.  If you drink alcohol, limit how much you have: ? 0-1 drink a day for women. ? 0-2 drinks a day for men.  Be aware of how much alcohol is in your drink. In the [place] one drink equals one 12 oz bottle of beer (355 mL), one 5 oz glass of wine (148 mL), or one 1 oz shot of hard liquor (44 mL). Eating and drinking   Eat foods that are high in fiber, such as fruits,  vegetables, and whole grains.  Eat foods that are high in calcium and vitamin D, such as milk, cheese, yogurt, eggs, liver, fish, and broccoli.  Limit foods that are high in fat, such as fried foods and desserts.  Limit the amount of red meat and processed meat you eat, such as hot dogs, sausage, bacon, and lunch meats. General instructions  Keep all follow-up visits as told by your health care provider. This is important. ? This includes having regularly scheduled colonoscopies. ? Talk to your health care provider about when you need a colonoscopy. Contact a health care provider if:  You have new or worsening bleeding during a bowel movement.  You have new or increased blood in your stool.  You have a change in bowel habits.  You lose weight for no known reason. Summary  Polyps are tissue growths inside the body. Polyps can grow in many places, including the colon.  Most colon polyps are noncancerous (benign), but some can become cancerous over time.  This condition is diagnosed with a colonoscopy.  Treatment for this condition involves removing any polyps that are found. Most polyps can be removed during a colonoscopy. This information is not intended to replace advice given to you by your health care provider. Make sure you discuss any questions you have with your health care provider. Document Revised: 02/08/2018 Document Reviewed: 02/08/2018 Elsevier Patient Education  [place]. No aspirin or NSAIDs for 3 days. Resume other medications as before. Modified carb high-fiber diet. No driving for 24 hours. Remember you cannot have an MRI until clips have passed.

## 2019-12-20 NOTE — Anesthesia Postprocedure Evaluation (Signed)
Anesthesia Post Note  Patient: Charles Medina  Procedure(s) Performed: COLONOSCOPY WITH PROPOFOL (N/A ) ESOPHAGOGASTRODUODENOSCOPY (EGD) WITH PROPOFOL (N/A ) BIOPSY POLYPECTOMY  Patient location during evaluation: PACU Anesthesia Type: General Level of consciousness: awake and alert and patient cooperative Pain management: satisfactory to patient Respiratory status: spontaneous breathing Cardiovascular status: stable Postop Assessment: no apparent nausea or vomiting Anesthetic complications: no     Last Vitals:  Vitals:   12/20/19 0901 12/20/19 1030  BP: (!) 177/93 (!) 143/93  Pulse:  91  Resp: 20 12  Temp: 36.9 C 36.7 C  SpO2: 97% 100%    Last Pain:  Vitals:   12/20/19 1030  TempSrc:   PainSc: 0-No pain                 Adriane Guglielmo

## 2019-12-20 NOTE — Transfer of Care (Signed)
Immediate Anesthesia Transfer of Care Note  Patient: Charles Medina  Procedure(s) Performed: COLONOSCOPY WITH PROPOFOL (N/A ) ESOPHAGOGASTRODUODENOSCOPY (EGD) WITH PROPOFOL (N/A ) BIOPSY POLYPECTOMY  Patient Location: PACU  Anesthesia Type:General  Level of Consciousness: awake and patient cooperative  Airway & Oxygen Therapy: Patient Spontanous Breathing and Patient connected to nasal cannula oxygen  Post-op Assessment: Report given to RN and Post -op Vital signs reviewed and stable  Post vital signs: Reviewed and stable  Last Vitals:  Vitals Value Taken Time  BP    Temp    Pulse    Resp    SpO2      Last Pain:  Vitals:   12/20/19 0901  TempSrc: Oral  PainSc: 0-No pain      Patients Stated Pain Goal: 9 (12/20/19 0901) SEE PACU FLOW SHEET FOR VITAL SIGNS Complications: No apparent anesthesia complications

## 2019-12-23 ENCOUNTER — Other Ambulatory Visit (INDEPENDENT_AMBULATORY_CARE_PROVIDER_SITE_OTHER): Payer: Self-pay | Admitting: Internal Medicine

## 2019-12-23 LAB — SURGICAL PATHOLOGY

## 2019-12-23 MED ORDER — FAMOTIDINE 40 MG PO TABS: 40.0000 mg | ORAL_TABLET | Freq: Every day | ORAL | 5 refills | Status: DC

## 2020-01-07 ENCOUNTER — Ambulatory Visit: Payer: Medicare HMO | Attending: Internal Medicine

## 2020-01-07 DIAGNOSIS — Z23 Encounter for immunization: Secondary | ICD-10-CM

## 2020-01-07 NOTE — Progress Notes (Signed)
   Covid-19 Vaccination Clinic  Name:  Charles Medina    MRN: [id number] DOB: May 08, 1941  01/07/2020  Mr. Applegate was observed post Covid-19 immunization for 15 minutes without incident. He was provided with Vaccine Information Sheet and instruction to access the V-Safe system.   Mr. Dietert was instructed to call 911 with any severe reactions post vaccine: Marland Kitchen Difficulty breathing  . Swelling of face and throat  . A fast heartbeat  . A bad rash all over body  . Dizziness and weakness   Immunizations Administered    Name Date Dose VIS Date Route   Moderna COVID-19 Vaccine 01/07/2020  8:52 AM 0.5 mL 10/08/2019 Intramuscular   Manufacturer: Moderna   Lot: [id number]   [id number]

## 2020-01-24 ENCOUNTER — Ambulatory Visit: Payer: Medicare HMO | Admitting: Family Medicine

## 2020-01-28 ENCOUNTER — Other Ambulatory Visit: Payer: Self-pay | Admitting: Family Medicine

## 2020-01-29 ENCOUNTER — Other Ambulatory Visit: Payer: Self-pay | Admitting: Family Medicine

## 2020-02-04 ENCOUNTER — Ambulatory Visit: Payer: Medicare HMO | Attending: Internal Medicine

## 2020-02-04 DIAGNOSIS — Z23 Encounter for immunization: Secondary | ICD-10-CM

## 2020-02-04 NOTE — Progress Notes (Signed)
   Covid-19 Vaccination Clinic  Name:  KAESON EARY    MRN: [id number] DOB: [birth year]  02/04/2020  Mr. Vinson was observed post Covid-19 immunization for 15 minutes without incident. He was provided with Vaccine Information Sheet and instruction to access the V-Safe system.   Mr. Mclaney was instructed to call 911 with any severe reactions post vaccine: Marland Kitchen Difficulty breathing  . Swelling of face and throat  . A fast heartbeat  . A bad rash all over body  . Dizziness and weakness   Immunizations Administered    Name Date Dose VIS Date Route   Moderna COVID-19 Vaccine 02/04/2020  9:07 AM 0.5 mL 10/08/2019 Intramuscular   Manufacturer: Moderna   Lot: [id number]   [id number]

## 2020-02-12 ENCOUNTER — Other Ambulatory Visit: Payer: Self-pay | Admitting: Family Medicine

## 2020-02-12 NOTE — Telephone Encounter (Signed)
30-day with 1 refill needs follow-up visit in person or virtual

## 2020-02-12 NOTE — Telephone Encounter (Signed)
Scheduled 4/20

## 2020-02-25 ENCOUNTER — Encounter: Payer: Self-pay | Admitting: Family Medicine

## 2020-02-25 ENCOUNTER — Other Ambulatory Visit: Payer: Self-pay

## 2020-02-25 ENCOUNTER — Ambulatory Visit (INDEPENDENT_AMBULATORY_CARE_PROVIDER_SITE_OTHER): Payer: Medicare HMO | Admitting: Family Medicine

## 2020-02-25 VITALS — BP 130/78 | Temp 97.2°F | Ht 71.0 in | Wt 156.4 lb

## 2020-02-25 DIAGNOSIS — E1121 Type 2 diabetes mellitus with diabetic nephropathy: Secondary | ICD-10-CM

## 2020-02-25 DIAGNOSIS — E119 Type 2 diabetes mellitus without complications: Secondary | ICD-10-CM

## 2020-02-25 DIAGNOSIS — I10 Essential (primary) hypertension: Secondary | ICD-10-CM

## 2020-02-25 DIAGNOSIS — M2010 Hallux valgus (acquired), unspecified foot: Secondary | ICD-10-CM

## 2020-02-25 DIAGNOSIS — M21619 Bunion of unspecified foot: Secondary | ICD-10-CM

## 2020-02-25 MED ORDER — LISINOPRIL 10 MG PO TABS: 10.0000 mg | ORAL_TABLET | Freq: Every day | ORAL | 1 refills | Status: DC

## 2020-02-25 MED ORDER — METFORMIN HCL 1000 MG PO TABS: 500.0000 mg | ORAL_TABLET | Freq: Two times a day (BID) | ORAL | 5 refills | Status: DC

## 2020-02-25 MED ORDER — ATORVASTATIN CALCIUM 80 MG PO TABS: 80.0000 mg | ORAL_TABLET | Freq: Every day | ORAL | 5 refills | Status: DC

## 2020-02-25 NOTE — Progress Notes (Signed)
Subjective:    Patient ID: Charles Medina, male    DOB: 27-Jan-1941, 79 y.o.   MRN: [id number]  Hypertension This is a chronic problem. The current episode started more than 1 year ago. Pertinent negatives include no chest pain, headaches or shortness of breath. Risk factors for coronary artery disease include dyslipidemia and male gender. Treatments tried: norvasc, zestril. There are no compliance problems.    The patient was seen today as part of a comprehensive diabetic check up.the patient does have diabetes.  The patient follows here on a regular basis.  The patient relates medication compliance. No significant side effects to the medications. Denies any low glucose spells. Relates compliance with diet to a reasonable level. Patient does do labwork intermittently and understands the dangers of diabetes. Patient does try to be healthy with his eating habits. Patient here for follow-up regarding cholesterol.  The patient does have hyperlipidemia.  Patient does try to maintain a reasonable diet.  Patient does take the medication on a regular basis.  Denies missing a dose.  The patient denies any obvious side effects.  Prior blood work results reviewed with the patient.  The patient is aware of his cholesterol goals and the need to keep it under good control to lessen the risk of disease. He states occasional fried foods but not often  Fall Risk  08/26/2019 08/26/2019 09/19/2018 06/03/2015 03/19/2015  Falls in the past year? 0 0 0 No No  Number falls in past yr: - - 0 - -  Injury with Fall? - - 0 - -  Follow up Falls evaluation completed Falls evaluation completed Education provided - -     Review of Systems  Constitutional: Negative for activity change, fatigue and fever.  HENT: Negative for congestion and rhinorrhea.   Respiratory: Negative for cough and shortness of breath.   Cardiovascular: Negative for chest pain and leg swelling.  Gastrointestinal: Negative for abdominal pain, diarrhea  and nausea.  Genitourinary: Negative for dysuria and hematuria.  Neurological: Negative for weakness and headaches.  Psychiatric/Behavioral: Negative for agitation and behavioral problems.       Objective:   Physical Exam Vitals reviewed.  Constitutional:      General: He is not in acute distress. HENT:     Head: Normocephalic and atraumatic.  Eyes:     General:        Right eye: No discharge.        Left eye: No discharge.  Neck:     Trachea: No tracheal deviation.  Cardiovascular:     Rate and Rhythm: Normal rate and regular rhythm.     Heart sounds: Normal heart sounds. No murmur.  Pulmonary:     Effort: Pulmonary effort is normal. No respiratory distress.     Breath sounds: Normal breath sounds.  Lymphadenopathy:     Cervical: No cervical adenopathy.  Skin:    General: Skin is warm and dry.  Neurological:     Mental Status: He is alert.     Coordination: Coordination normal.  Psychiatric:        Behavior: Behavior normal.           Assessment & Plan:  1. Essential hypertension Blood pressure on recheck looks good.  Watch salt diet stay physically active continue medication  2. Type 2 diabetes mellitus without complication, without long-term current use of insulin ([place]) Diabetes reportedly under good control though we need to check lab work previous labs were reviewed.  Continue current measures.  3. Diabetic nephropathy associated with type 2 diabetes mellitus ([place]) Patient does have diabetic nephropathy.  I have advised him against protein drinks.  Healthy eating drinking plenty liquids continue current medication check metabolic 7 may need to adjust Metformin based on GFR  4. Hallux valgus with bunions, unspecified laterality He has some deformities but no sign of any type of ulcers. Between reviewing previous labs reviewing his medications with him talked with the patient doing multiple blood pressure checks and reviewing records documenting and time spent  with patient 30 minutes

## 2020-02-25 NOTE — Patient Instructions (Signed)

## 2020-02-26 LAB — HEPATIC FUNCTION PANEL
ALT: 29 IU/L (ref 0–44)
AST: 29 IU/L (ref 0–40)
Albumin: 4.6 g/dL (ref 3.7–4.7)
Alkaline Phosphatase: 94 IU/L (ref 39–117)
Bilirubin Total: 0.4 mg/dL (ref 0.0–1.2)
Bilirubin, Direct: 0.15 mg/dL (ref 0.00–0.40)
Total Protein: 7.3 g/dL (ref 6.0–8.5)

## 2020-02-26 LAB — BASIC METABOLIC PANEL
BUN/Creatinine Ratio: 12 (ref 10–24)
BUN: 19 mg/dL (ref 8–27)
CO2: 23 mmol/L (ref 20–29)
Calcium: 10.4 mg/dL — ABNORMAL HIGH (ref 8.6–10.2)
Chloride: 100 mmol/L (ref 96–106)
Creatinine, Ser: 1.57 mg/dL — ABNORMAL HIGH (ref 0.76–1.27)
GFR calc Af Amer: 47 mL/min/{1.73_m2} — ABNORMAL LOW (ref >59)
GFR calc non Af Amer: 41 mL/min/{1.73_m2} — ABNORMAL LOW (ref >59)
Glucose: 122 mg/dL — ABNORMAL HIGH (ref 65–99)
Potassium: 4.7 mmol/L (ref 3.5–5.2)
Sodium: 139 mmol/L (ref 134–144)

## 2020-02-26 LAB — LIPID PANEL
Chol/HDL Ratio: 3.1 ratio (ref 0.0–5.0)
Cholesterol, Total: 105 mg/dL (ref 100–199)
HDL: 34 mg/dL — ABNORMAL LOW (ref >39)
LDL Chol Calc (NIH): 60 mg/dL (ref 0–99)
Triglycerides: 43 mg/dL (ref 0–149)
VLDL Cholesterol Cal: 11 mg/dL (ref 5–40)

## 2020-02-26 LAB — HEMOGLOBIN A1C
Est. average glucose Bld gHb Est-mCnc: 163 mg/dL
Hgb A1c MFr Bld: 7.3 % — ABNORMAL HIGH (ref 4.8–5.6)

## 2020-02-26 LAB — MICROALBUMIN / CREATININE URINE RATIO
Creatinine, Urine: 137.9 mg/dL
Microalb/Creat Ratio: 54 mg/g creat — ABNORMAL HIGH (ref 0–29)
Microalbumin, Urine: 75.1 ug/mL

## 2020-03-02 ENCOUNTER — Other Ambulatory Visit: Payer: Self-pay | Admitting: *Deleted

## 2020-03-02 DIAGNOSIS — E119 Type 2 diabetes mellitus without complications: Secondary | ICD-10-CM

## 2020-03-02 DIAGNOSIS — I10 Essential (primary) hypertension: Secondary | ICD-10-CM

## 2020-03-02 MED ORDER — LISINOPRIL 5 MG PO TABS: 5.0000 mg | ORAL_TABLET | Freq: Every day | ORAL | 4 refills | Status: DC

## 2020-03-02 MED ORDER — METFORMIN HCL 500 MG PO TABS: 500.0000 mg | ORAL_TABLET | Freq: Two times a day (BID) | ORAL | 4 refills | Status: DC

## 2020-04-22 ENCOUNTER — Other Ambulatory Visit: Payer: Self-pay | Admitting: Family Medicine

## 2020-05-11 ENCOUNTER — Other Ambulatory Visit: Payer: Self-pay | Admitting: Family Medicine

## 2020-06-25 ENCOUNTER — Ambulatory Visit (INDEPENDENT_AMBULATORY_CARE_PROVIDER_SITE_OTHER): Payer: Medicare HMO | Admitting: Gastroenterology

## 2020-07-22 ENCOUNTER — Ambulatory Visit (INDEPENDENT_AMBULATORY_CARE_PROVIDER_SITE_OTHER): Payer: Medicare HMO | Admitting: Gastroenterology

## 2020-08-08 ENCOUNTER — Other Ambulatory Visit: Payer: Self-pay | Admitting: Family Medicine

## 2020-08-25 ENCOUNTER — Other Ambulatory Visit: Payer: Self-pay | Admitting: Family Medicine

## 2020-08-26 NOTE — Telephone Encounter (Signed)
Has appointment on 11/16 .

## 2020-09-22 ENCOUNTER — Encounter: Payer: Self-pay | Admitting: Family Medicine

## 2020-09-22 ENCOUNTER — Ambulatory Visit (INDEPENDENT_AMBULATORY_CARE_PROVIDER_SITE_OTHER): Payer: Medicare HMO | Admitting: Family Medicine

## 2020-09-22 ENCOUNTER — Other Ambulatory Visit: Payer: Self-pay

## 2020-09-22 VITALS — BP 132/80 | HR 59 | Temp 98.0°F | Ht 71.0 in | Wt 156.0 lb

## 2020-09-22 DIAGNOSIS — C61 Malignant neoplasm of prostate: Secondary | ICD-10-CM

## 2020-09-22 DIAGNOSIS — Z23 Encounter for immunization: Secondary | ICD-10-CM

## 2020-09-22 DIAGNOSIS — E119 Type 2 diabetes mellitus without complications: Secondary | ICD-10-CM | POA: Diagnosis not present

## 2020-09-22 DIAGNOSIS — Z8546 Personal history of malignant neoplasm of prostate: Secondary | ICD-10-CM | POA: Diagnosis not present

## 2020-09-22 DIAGNOSIS — E1169 Type 2 diabetes mellitus with other specified complication: Secondary | ICD-10-CM

## 2020-09-22 DIAGNOSIS — I714 Abdominal aortic aneurysm, without rupture, unspecified: Secondary | ICD-10-CM

## 2020-09-22 DIAGNOSIS — D696 Thrombocytopenia, unspecified: Secondary | ICD-10-CM | POA: Insufficient documentation

## 2020-09-22 DIAGNOSIS — E785 Hyperlipidemia, unspecified: Secondary | ICD-10-CM

## 2020-09-22 DIAGNOSIS — I10 Essential (primary) hypertension: Secondary | ICD-10-CM | POA: Diagnosis not present

## 2020-09-22 MED ORDER — LISINOPRIL 5 MG PO TABS
5.0000 mg | ORAL_TABLET | Freq: Every day | ORAL | 5 refills | Status: DC
Start: 2020-09-22 — End: 2021-04-21

## 2020-09-22 MED ORDER — METFORMIN HCL 500 MG PO TABS
ORAL_TABLET | ORAL | 5 refills | Status: DC
Start: 2020-09-22 — End: 2021-05-24

## 2020-09-22 MED ORDER — ATORVASTATIN CALCIUM 80 MG PO TABS
80.0000 mg | ORAL_TABLET | Freq: Every day | ORAL | 5 refills | Status: DC
Start: 2020-09-22 — End: 2021-05-03

## 2020-09-22 MED ORDER — AMLODIPINE BESYLATE 2.5 MG PO TABS: 2.5000 mg | ORAL_TABLET | Freq: Every day | ORAL | 5 refills | Status: DC

## 2020-09-22 NOTE — Progress Notes (Signed)
Subjective:    Patient ID: Charles Medina, male    DOB: June 10, 1941, 79 y.o.   MRN: [id number]  Diabetes He presents for his follow-up diabetic visit. He has type 2 diabetes mellitus. Pertinent negatives for hypoglycemia include no confusion, dizziness or headaches. Pertinent negatives for diabetes include no chest pain and no fatigue. Exercise: goes to the gym. (Pt states his sugar is running good) Eye exam is current.  Primary hypertension - Plan: Basic metabolic panel  Hyperlipidemia associated with type 2 diabetes mellitus ([place])  History of prostate cancer - Plan: PSA  Thrombocytopenia (HCC)  AAA (abdominal aortic aneurysm) without rupture ([place]), Chronic  Prostate cancer ([place]), Chronic  Diabetes mellitus without complication ([place]) - Plan: Hemoglobin A1c  Need for vaccination - Plan: Flu Vaccine QUAD High Dose(Fluad)  Patient denies any problems with his aortic graft.  Has not done follow-up in several years.  We will connect with vascular surgery to see if they recommend follow-up  Patient also has not done a follow-up with urology in the past few years he has a history of prostatectomy 2011   Would like a flu vaccine.    Fall Risk  09/22/2020 02/25/2020 08/26/2019 08/26/2019 09/19/2018  Falls in the past year? 0 0 0 0 0  Number falls in past yr: - - - - 0  Injury with Fall? - - - - 0  Risk for fall due to : - History of fall(s) - - -  Follow up Falls evaluation completed Falls evaluation completed Falls evaluation completed Falls evaluation completed Education provided       Review of Systems  Constitutional: Negative for diaphoresis and fatigue.  HENT: Negative for congestion and rhinorrhea.   Respiratory: Negative for cough and shortness of breath.   Cardiovascular: Negative for chest pain and leg swelling.  Gastrointestinal: Negative for abdominal pain and diarrhea.  Skin: Negative for color change and rash.  Neurological: Negative for dizziness and headaches.   Psychiatric/Behavioral: Negative for behavioral problems and confusion.       Objective:   Physical Exam Vitals reviewed.  Constitutional:      General: He is not in acute distress. HENT:     Head: Normocephalic and atraumatic.  Eyes:     General:        Right eye: No discharge.        Left eye: No discharge.  Neck:     Trachea: No tracheal deviation.  Cardiovascular:     Rate and Rhythm: Normal rate and regular rhythm.     Heart sounds: Normal heart sounds. No murmur heard.   Pulmonary:     Effort: Pulmonary effort is normal. No respiratory distress.     Breath sounds: Normal breath sounds.  Lymphadenopathy:     Cervical: No cervical adenopathy.  Skin:    General: Skin is warm and dry.  Neurological:     Mental Status: He is alert.     Coordination: Coordination normal.  Psychiatric:        Behavior: Behavior normal.           Assessment & Plan:  1. Primary hypertension Blood pressure good control continue current measures watch diet stay active take medication - Basic metabolic panel  2. Hyperlipidemia associated with type 2 diabetes mellitus ([place]) Continue medication watch diet check lab work in the near future  3. History of prostate cancer History of prostate cancer need to check PSA to make sure it is still very low had prostate removed  in 2011 - PSA  4. Thrombocytopenia (HCC) History of thrombocytopenia will follow closely with periodic CBCs no sign of any bleeding or bruising currently  5. AAA (abdominal aortic aneurysm) without rupture ([place]) Patient has had stent placed there is no sign of any rupture no abdominal pain or discomfort  6. Prostate cancer [facility]) Monitoring the PSA patient had prostatectomy in 2011  7. Diabetes mellitus without complication ([place]) Diabetes under good control continue A1c monitoring continue healthy eating - Hemoglobin A1c  8. Need for vaccination Flu shot - Flu Vaccine QUAD High Dose(Fluad)

## 2020-09-23 ENCOUNTER — Telehealth: Payer: Self-pay

## 2020-09-23 LAB — BASIC METABOLIC PANEL
BUN/Creatinine Ratio: 12 (ref 10–24)
BUN: 18 mg/dL (ref 8–27)
CO2: 25 mmol/L (ref 20–29)
Calcium: 10.1 mg/dL (ref 8.6–10.2)
Chloride: 103 mmol/L (ref 96–106)
Creatinine, Ser: 1.5 mg/dL — ABNORMAL HIGH (ref 0.76–1.27)
GFR calc Af Amer: 50 mL/min/{1.73_m2} — ABNORMAL LOW (ref >59)
GFR calc non Af Amer: 43 mL/min/{1.73_m2} — ABNORMAL LOW (ref >59)
Glucose: 143 mg/dL — ABNORMAL HIGH (ref 65–99)
Potassium: 5.3 mmol/L — ABNORMAL HIGH (ref 3.5–5.2)
Sodium: 141 mmol/L (ref 134–144)

## 2020-09-23 LAB — PSA: Prostate Specific Ag, Serum: 0.6 ng/mL (ref 0.0–4.0)

## 2020-09-23 LAB — HEMOGLOBIN A1C
Est. average glucose Bld gHb Est-mCnc: 154 mg/dL
Hgb A1c MFr Bld: 7 % — ABNORMAL HIGH (ref 4.8–5.6)

## 2020-09-23 NOTE — Telephone Encounter (Signed)
MD office called to see when patient needed f/u for EVAR. EVAR s/p 2016 with Bridgett Larsson and in 2018 - he needed an appt for f/u EVAR duplex and bilateral ABIs. Told office to have patient call if he wants to schedule this appt and we will get him in for those studies and provider appt. Their office will contact patient.

## 2020-09-23 NOTE — Progress Notes (Signed)
[facility] returned called. They last seen patient in 2018 and he was due to return in 1-2 months for follow up doppler and ABIs. Pt has not made appt to follow with them at this time.

## 2020-09-23 NOTE — Progress Notes (Signed)
09/23/20- left detailed message on nurse voicemail at [place]

## 2020-09-27 NOTE — Progress Notes (Signed)
Please inform the patient that vascular surgery recommends a standard follow-up visit.  Please go ahead with referral.

## 2020-09-30 ENCOUNTER — Other Ambulatory Visit: Payer: Self-pay | Admitting: *Deleted

## 2020-09-30 DIAGNOSIS — I714 Abdominal aortic aneurysm, without rupture, unspecified: Secondary | ICD-10-CM

## 2020-09-30 NOTE — Progress Notes (Signed)
Referral ordered in Epic. Patient notified. 

## 2020-10-08 ENCOUNTER — Ambulatory Visit: Payer: Medicare HMO

## 2020-10-15 ENCOUNTER — Other Ambulatory Visit: Payer: Self-pay | Admitting: *Deleted

## 2020-10-15 DIAGNOSIS — Z79899 Other long term (current) drug therapy: Secondary | ICD-10-CM

## 2020-11-09 ENCOUNTER — Other Ambulatory Visit: Payer: Self-pay

## 2020-11-09 DIAGNOSIS — I714 Abdominal aortic aneurysm, without rupture, unspecified: Secondary | ICD-10-CM

## 2020-11-11 ENCOUNTER — Encounter: Payer: Self-pay | Admitting: Family Medicine

## 2020-11-11 DIAGNOSIS — E119 Type 2 diabetes mellitus without complications: Secondary | ICD-10-CM | POA: Diagnosis not present

## 2020-11-11 LAB — HM DIABETES EYE EXAM

## 2020-11-25 ENCOUNTER — Other Ambulatory Visit (HOSPITAL_COMMUNITY): Payer: Medicare HMO

## 2020-11-25 ENCOUNTER — Ambulatory Visit: Payer: Medicare HMO

## 2020-12-03 ENCOUNTER — Encounter (INDEPENDENT_AMBULATORY_CARE_PROVIDER_SITE_OTHER): Payer: Self-pay | Admitting: *Deleted

## 2020-12-29 ENCOUNTER — Other Ambulatory Visit: Payer: Self-pay

## 2020-12-29 DIAGNOSIS — I714 Abdominal aortic aneurysm, without rupture, unspecified: Secondary | ICD-10-CM

## 2020-12-30 ENCOUNTER — Ambulatory Visit (HOSPITAL_COMMUNITY)
Admission: RE | Admit: 2020-12-30 | Discharge: 2020-12-30 | Disposition: A | Discharge status: Home / Self Care | Payer: Medicare HMO | Source: Ambulatory Visit | Attending: Vascular Surgery | Admitting: Vascular Surgery

## 2020-12-30 ENCOUNTER — Ambulatory Visit: Payer: Medicare HMO | Admitting: Physician Assistant

## 2020-12-30 ENCOUNTER — Telehealth: Payer: Self-pay | Admitting: Family Medicine

## 2020-12-30 ENCOUNTER — Other Ambulatory Visit: Payer: Self-pay

## 2020-12-30 VITALS — BP 193/113 | HR 80 | Temp 98.1°F | Resp 20 | Ht 71.0 in | Wt 149.6 lb

## 2020-12-30 DIAGNOSIS — R0989 Other specified symptoms and signs involving the circulatory and respiratory systems: Secondary | ICD-10-CM | POA: Diagnosis not present

## 2020-12-30 DIAGNOSIS — I714 Abdominal aortic aneurysm, without rupture, unspecified: Secondary | ICD-10-CM

## 2020-12-30 NOTE — Progress Notes (Signed)
HISTORY AND PHYSICAL     CC:  follow up. Requesting Provider:  Kathyrn Drown, MD  HPI: This is a 80 y.o. male who is here today for follow up for hx of EVAR.   He states that he has been doing well since his surgery.  He has not been back here since his one month follow up.  He does not have any pain or cramping in his calves when he walks or non healing wounds.  He states he walks often on the treadmill.   He denies any sx of stroke including amaurosis fugax, unilateral weakness, numbness, paralysis, speech difficulties or facial droop.     Pt has 6 children and 27 grandchildren.   The pt returns today for follow up u/s for EVAR.  The pt is on a statin for cholesterol management.    The pt is on an aspirin.    Other AC:  none The pt is on CCB, ACEI for hypertension.  The pt does have diabetes. Tobacco hx:  former    Past Medical History:  Diagnosis Date  . AAA (abdominal aortic aneurysm) ([place])   . Arthritis   . Diabetes mellitus without complication ([place])   . Hypertension   . Prostate cancer ([place])   . Type 2 diabetes mellitus ([place])     Past Surgical History:  Procedure Laterality Date  . ABDOMINAL AORTIC ENDOVASCULAR STENT GRAFT N/A 06/25/2015   Procedure: ABDOMINAL AORTIC ENDOVASCULAR STENT GRAFT;  Surgeon: Conrad Valmeyer, MD;  Location: [facility];  Service: Vascular;  Laterality: N/A;  . BIOPSY  12/20/2019   Procedure: BIOPSY;  Surgeon: Rogene Houston, MD;  [facility];  Service: Endoscopy;;  esophagus  . COLONOSCOPY  11/14/2012   Procedure: COLONOSCOPY;  Surgeon: Rogene Houston, MD;  [facility];  Service: Endoscopy;  Laterality: N/A;  830  . COLONOSCOPY WITH PROPOFOL N/A 12/20/2019   Procedure: COLONOSCOPY WITH PROPOFOL;  Surgeon: Rogene Houston, MD;  [facility];  Service: Endoscopy;  Laterality: N/A;  940  . ESOPHAGOGASTRODUODENOSCOPY (EGD) WITH PROPOFOL N/A 12/20/2019   Procedure: ESOPHAGOGASTRODUODENOSCOPY (EGD) WITH PROPOFOL;   Surgeon: Rogene Houston, MD;  [facility];  Service: Endoscopy;  Laterality: N/A;  . POLYPECTOMY  12/20/2019   Procedure: POLYPECTOMY;  Surgeon: Rogene Houston, MD;  [facility];  Service: Endoscopy;;  colon  . PROSTATECTOMY  2011    No Known Allergies  Current Outpatient Medications  Medication Sig Dispense Refill  . amLODipine (NORVASC) 2.5 MG tablet Take 1 tablet (2.5 mg total) by mouth daily. 30 tablet 5  . aspirin 81 MG tablet Take 1 tablet (81 mg total) by mouth daily. 30 tablet   . atorvastatin (LIPITOR) 80 MG tablet Take 1 tablet (80 mg total) by mouth daily. 30 tablet 5  . famotidine (PEPCID) 40 MG tablet Take 1 tablet (40 mg total) by mouth at bedtime. 30 tablet 5  . lisinopril (ZESTRIL) 5 MG tablet Take 1 tablet (5 mg total) by mouth daily. 30 tablet 5  . metFORMIN (GLUCOPHAGE) 500 MG tablet TAKE 1 TABLET BY MOUTH TWO TIMES DAILY WITH A MEAL. 60 tablet 5  . Multiple Vitamins-Minerals (EMERGEN-C IMMUNE PO) Take 1 packet by mouth daily as needed (immune health/energy).    . sildenafil (REVATIO) 20 MG tablet TAKE 3 TO 5 TABLETS AS NEEDED AND INSTRUCTED. (Patient taking differently: Take 60-100 mg by mouth daily as needed (erectile dysfunction.). TAKE 3 TO 5 TABLETS  AS NEEDED AND INSTRUCTED.) 30 tablet 4   No current facility-administered medications for this visit.    Family History  Family history unknown: Yes    Social History   Socioeconomic History  . Marital status: Widowed    Spouse name: Not on file  . Number of children: 6  . Years of education: Not on file  . Highest education level: Not on file  Occupational History  . Not on file  Tobacco Use  . Smoking status: Former Smoker    Packs/day: 0.50    Years: 23.00    Pack years: 11.50    Types: Cigarettes    Start date: 05/08/1956    Quit date: 11/15/1971    Years since quitting: 49.1  . Smokeless tobacco: Never Used  Substance and Sexual Activity  . Alcohol use: No    Alcohol/week:  0.0 standard drinks  . Drug use: No  . Sexual activity: Yes  Other Topics Concern  . Not on file  Social History Narrative  . Not on file   Social Determinants of Health   Financial Resource Strain: Not on file  Food Insecurity: Not on file  Transportation Needs: Not on file  Physical Activity: Not on file  Stress: Not on file  Social Connections: Not on file  Intimate Partner Violence: Not on file     REVIEW OF SYSTEMS:   [X]  denotes positive finding, [ ]  denotes negative finding Cardiac  Comments:  Chest pain or chest pressure:    Shortness of breath upon exertion:    Short of breath when lying flat:    Irregular heart rhythm:        Vascular    Pain in calf, thigh, or hip brought on by ambulation:    Pain in feet at night that wakes you up from your sleep:     Blood clot in your veins:    Leg swelling:         Pulmonary    Oxygen at home:    Productive cough:     Wheezing:         Neurologic    Sudden weakness in arms or legs:     Sudden numbness in arms or legs:     Sudden onset of difficulty speaking or slurred speech:    Temporary loss of vision in one eye:     Problems with dizziness:         Gastrointestinal    Blood in stool:     Vomited blood:         Genitourinary    Burning when urinating:     Blood in urine:        Psychiatric    Major depression:         Hematologic    Bleeding problems:    Problems with blood clotting too easily:        Skin    Rashes or ulcers:        Constitutional    Fever or chills:      PHYSICAL EXAMINATION:  Today's Vitals   12/30/20 0845  BP: (!) 193/113  Pulse: 80  Resp: 20  Temp: 98.1 F (36.7 C)  TempSrc: Temporal  SpO2: 100%  Weight: 149 lb 9.6 oz (67.9 kg)  Height: 5\' 11"  (1.803 m)   Body mass index is 20.86 kg/m.   General:  WDWN in NAD; vital signs documented above Gait: Not observed HENT: WNL, normocephalic Pulmonary: normal non-labored breathing , without wheezing  Cardiac:  regular HR, without  Murmur; with carotid bruit (right) Abdomen: soft, NT, no masses; aortic pulse is not palpable Skin: without rashes Vascular Exam/Pulses:  Right Left  Radial 2+ (normal) 2+ (normal)  Femoral 2+ (normal) 2+ (normal)  Popliteal Unable to palpate Unable to palpate  DP monophasic monophasic  PT monophasic monophasic   Extremities: without ischemic changes, without Gangrene , without cellulitis; without open wounds;  Musculoskeletal: no muscle wasting or atrophy  Neurologic: A&O X 3;  No focal weakness or paresthesias are detected Psychiatric:  The pt has Normal affect.   Non-Invasive Vascular Imaging:   AAA Arterial duplex on 12/30/2020: Endovascular Aortic Repair (EVAR):  +----------+----------------+-------------------+-------------------+       Diameter AP (cm)Diameter Trans (cm)Velocities (cm/sec)  +----------+----------------+-------------------+-------------------+  Aorta   3.44      3.76        36           +----------+----------------+-------------------+-------------------+  Right Limb1.23      1.14        55           +----------+----------------+-------------------+-------------------+  Left Limb 1.18      1.37        40           +----------+----------------+-------------------+-------------------+   Summary:  Abdominal Aorta: Patent endovascular aneurysm repair with no evidence of endoleak.    ASSESSMENT/PLAN:: 80 y.o. male here for follow up for EVAR by Dr. Bridgett Larsson in 2016  AAA -pt has not been back for follow up since his one month follow up.  He is doing well and has not had any issues.  His u/s today looks good without endoleak.   I discussed with him that this can have a genetic component and that when his children turn 37, medicare will pay for one free AAA screening.  He will discuss this with his children. -f/u in one year with EVAR duplex  PAD -pt with  monophasic doppler signals.  He does not have any claudication, rest pain or wounds and walks without difficulty.   He will contact us if he has any issues.  Right Carotid bruit -pt is asymptomatic.  Will have him follow up with carotid duplex in [place] in the next month and a phone visit with [place] to go over results.  Discussed symptoms of stroke and he knows to get to the hospital should he have any of these.  He expressed understanding.  Pt hypertensive today-pt follows with Dr. Wolfgang Phoenix  -continue statin/asa   Leontine Locket, [facility] [phone]  Clinic MD:   Scot Dock

## 2020-12-30 NOTE — Telephone Encounter (Signed)
Patient's blood pressure elevated at the vascular surgeon's office.  Please set up patient to follow-up within the next 3 weeks to see me to recheck blood pressure, please take medications on the day that he comes, bring all medications-thanks-Dr.

## 2020-12-30 NOTE — Telephone Encounter (Signed)
Please contact patient to have him set up appt for BP. Thank you!

## 2021-01-04 ENCOUNTER — Other Ambulatory Visit: Payer: Self-pay | Admitting: *Deleted

## 2021-01-04 DIAGNOSIS — R0989 Other specified symptoms and signs involving the circulatory and respiratory systems: Secondary | ICD-10-CM

## 2021-01-06 ENCOUNTER — Ambulatory Visit: Payer: Medicare HMO | Admitting: Family Medicine

## 2021-01-27 ENCOUNTER — Other Ambulatory Visit: Payer: Self-pay

## 2021-01-27 ENCOUNTER — Ambulatory Visit (INDEPENDENT_AMBULATORY_CARE_PROVIDER_SITE_OTHER): Payer: Medicare HMO | Admitting: Family Medicine

## 2021-01-27 ENCOUNTER — Encounter: Payer: Self-pay | Admitting: Family Medicine

## 2021-01-27 VITALS — BP 152/88 | HR 82 | Temp 97.4°F | Ht 71.0 in | Wt 152.0 lb

## 2021-01-27 DIAGNOSIS — E785 Hyperlipidemia, unspecified: Secondary | ICD-10-CM | POA: Diagnosis not present

## 2021-01-27 DIAGNOSIS — E1169 Type 2 diabetes mellitus with other specified complication: Secondary | ICD-10-CM

## 2021-01-27 DIAGNOSIS — E1122 Type 2 diabetes mellitus with diabetic chronic kidney disease: Secondary | ICD-10-CM | POA: Diagnosis not present

## 2021-01-27 DIAGNOSIS — E875 Hyperkalemia: Secondary | ICD-10-CM

## 2021-01-27 DIAGNOSIS — N289 Disorder of kidney and ureter, unspecified: Secondary | ICD-10-CM

## 2021-01-27 DIAGNOSIS — N1831 Chronic kidney disease, stage 3a: Secondary | ICD-10-CM

## 2021-01-27 NOTE — Patient Instructions (Signed)
Stop aspirin.

## 2021-01-27 NOTE — Progress Notes (Signed)
   Subjective:    Patient ID: Charles Medina, male    DOB: January 21, 1941, 80 y.o.   MRN: [id number]  Hypertension This is a chronic problem. Pertinent negatives include no chest pain, headaches or shortness of breath. Treatments tried: amlodipine, lisinopril. There are no compliance problems (eats healthy, exercises, takes meds every day).   pt states no concerns today.  Hyperlipidemia associated with type 2 diabetes mellitus ([place]) - Plan: Phosphorus, Magnesium, Basic metabolic panel  Renal insufficiency - Plan: Phosphorus, Magnesium, Basic metabolic panel  Hyperkalemia - Plan: Phosphorus, Magnesium, Basic metabolic panel  Type 2 diabetes mellitus with stage 3a chronic kidney disease, without long-term current use of insulin ([place]) Labs from November were reviewed with the patient Patient does take his medication for blood pressure regular basis Takes his cholesterol medicine watches his diet Still on Metformin A1c recently under good control but will be important to read look at kidney function may need to adjust medicine Patient is working out several days per week Denies any recent falls or injuries.   Review of Systems  Constitutional: Negative for activity change, fatigue and fever.  HENT: Negative for congestion and rhinorrhea.   Respiratory: Negative for cough and shortness of breath.   Cardiovascular: Negative for chest pain and leg swelling.  Gastrointestinal: Negative for abdominal pain, diarrhea and nausea.  Genitourinary: Negative for dysuria and hematuria.  Neurological: Negative for weakness and headaches.  Psychiatric/Behavioral: Negative for agitation and behavioral problems.       Objective:   Physical Exam        Assessment & Plan:  1. Hyperlipidemia associated with type 2 diabetes mellitus ([place]) Patient doing a good job taking his cholesterol medicine recent labs look good continue current measures watch diet - Phosphorus - Magnesium - Basic metabolic  panel  2. Renal insufficiency Chronic kidney disease recheck metabolic 7 await the results may need to adjust his Metformin depending on the results patient was encouraged to keep A1c under good control blood pressure under good control eat healthy and avoid excessive proteins - Phosphorus - Magnesium - Basic metabolic panel  3. Hyperkalemia Hopefully on follow-up labs that look good - Phosphorus - Magnesium - Basic metabolic panel

## 2021-01-28 ENCOUNTER — Other Ambulatory Visit: Payer: Self-pay | Admitting: *Deleted

## 2021-01-28 DIAGNOSIS — E119 Type 2 diabetes mellitus without complications: Secondary | ICD-10-CM

## 2021-01-28 DIAGNOSIS — E785 Hyperlipidemia, unspecified: Secondary | ICD-10-CM

## 2021-01-28 DIAGNOSIS — I10 Essential (primary) hypertension: Secondary | ICD-10-CM

## 2021-01-28 LAB — BASIC METABOLIC PANEL
BUN/Creatinine Ratio: 14 (ref 10–24)
BUN: 21 mg/dL (ref 8–27)
CO2: 22 mmol/L (ref 20–29)
Calcium: 10 mg/dL (ref 8.6–10.2)
Chloride: 104 mmol/L (ref 96–106)
Creatinine, Ser: 1.55 mg/dL — ABNORMAL HIGH (ref 0.76–1.27)
Glucose: 121 mg/dL — ABNORMAL HIGH (ref 65–99)
Potassium: 5.2 mmol/L (ref 3.5–5.2)
Sodium: 141 mmol/L (ref 134–144)
eGFR: 45 mL/min/{1.73_m2} — ABNORMAL LOW (ref >59)

## 2021-01-28 LAB — PHOSPHORUS: Phosphorus: 2.7 mg/dL — ABNORMAL LOW (ref 2.8–4.1)

## 2021-01-28 LAB — MAGNESIUM: Magnesium: 2 mg/dL (ref 1.6–2.3)

## 2021-02-04 ENCOUNTER — Ambulatory Visit: Payer: Medicare HMO

## 2021-02-08 ENCOUNTER — Other Ambulatory Visit: Payer: Self-pay

## 2021-02-08 ENCOUNTER — Ambulatory Visit (INDEPENDENT_AMBULATORY_CARE_PROVIDER_SITE_OTHER): Payer: Medicare HMO

## 2021-02-08 DIAGNOSIS — R0989 Other specified symptoms and signs involving the circulatory and respiratory systems: Secondary | ICD-10-CM

## 2021-02-10 ENCOUNTER — Telehealth: Payer: Self-pay | Admitting: Physician Assistant

## 2021-02-10 NOTE — Telephone Encounter (Signed)
Reviewed pt's carotid duplex and was 1-39% bilaterally.    Called pt to inform him of these results.  He continues to do well and will keep appt for one year with EVAR duplex.     Leontine Locket, [facility] 02/10/2021 9:59 AM

## 2021-02-12 ENCOUNTER — Ambulatory Visit: Payer: Medicare HMO

## 2021-02-16 ENCOUNTER — Other Ambulatory Visit: Payer: Self-pay | Admitting: Family Medicine

## 2021-02-19 ENCOUNTER — Ambulatory Visit: Payer: Medicare HMO | Admitting: Family Medicine

## 2021-04-06 ENCOUNTER — Telehealth: Payer: Self-pay | Admitting: Family Medicine

## 2021-04-06 NOTE — Telephone Encounter (Signed)
Left message for patient to call back and schedule Medicare Annual Wellness Visit (AWV) in office.   If not able to come in office, please offer to do virtually or by telephone.   Due for AWVS   Please schedule at anytime with Nurse Health Advisor.

## 2021-04-20 ENCOUNTER — Other Ambulatory Visit: Payer: Self-pay | Admitting: Family Medicine

## 2021-04-21 ENCOUNTER — Telehealth: Payer: Self-pay

## 2021-04-21 MED ORDER — LISINOPRIL 5 MG PO TABS: 5.0000 mg | ORAL_TABLET | Freq: Every day | ORAL | 0 refills | Status: DC

## 2021-04-21 MED ORDER — AMLODIPINE BESYLATE 2.5 MG PO TABS: 2.5000 mg | ORAL_TABLET | Freq: Every day | ORAL | 0 refills | Status: DC

## 2021-04-21 NOTE — Telephone Encounter (Signed)
Prescriptions sent electronically to pharmacy. Patient notified. °

## 2021-04-21 NOTE — Telephone Encounter (Signed)
Pt is out of blood pressure meds amLODipine (NORVASC) 2.5 MG tablet , lisinopril (ZESTRIL) 5 MG tablet [facility] - [place], [place] - [place]   Pt call back [phone]

## 2021-05-03 ENCOUNTER — Other Ambulatory Visit: Payer: Self-pay

## 2021-05-03 ENCOUNTER — Other Ambulatory Visit: Payer: Self-pay | Admitting: Family Medicine

## 2021-05-24 ENCOUNTER — Other Ambulatory Visit: Payer: Self-pay | Admitting: Family Medicine

## 2021-06-01 ENCOUNTER — Other Ambulatory Visit: Payer: Self-pay | Admitting: Family Medicine

## 2021-06-11 ENCOUNTER — Other Ambulatory Visit: Payer: Self-pay | Admitting: Family Medicine

## 2021-06-23 DIAGNOSIS — E119 Type 2 diabetes mellitus without complications: Secondary | ICD-10-CM | POA: Diagnosis not present

## 2021-06-23 DIAGNOSIS — E785 Hyperlipidemia, unspecified: Secondary | ICD-10-CM | POA: Diagnosis not present

## 2021-06-23 DIAGNOSIS — I10 Essential (primary) hypertension: Secondary | ICD-10-CM | POA: Diagnosis not present

## 2021-06-24 ENCOUNTER — Other Ambulatory Visit: Payer: Self-pay | Admitting: Family Medicine

## 2021-06-24 LAB — MICROALBUMIN / CREATININE URINE RATIO
Creatinine, Urine: 208.2 mg/dL
Microalb/Creat Ratio: 57 mg/g creat — ABNORMAL HIGH (ref 0–29)
Microalbumin, Urine: 119.6 ug/mL

## 2021-06-24 LAB — COMPREHENSIVE METABOLIC PANEL WITH GFR
ALT: 25 IU/L (ref 0–44)
AST: 22 IU/L (ref 0–40)
Albumin/Globulin Ratio: 1.7 (ref 1.2–2.2)
Albumin: 4.3 g/dL (ref 3.6–4.6)
Alkaline Phosphatase: 103 IU/L (ref 44–121)
BUN/Creatinine Ratio: 13 (ref 10–24)
BUN: 21 mg/dL (ref 8–27)
Bilirubin Total: 0.3 mg/dL (ref 0.0–1.2)
CO2: 22 mmol/L (ref 20–29)
Calcium: 9.2 mg/dL (ref 8.6–10.2)
Chloride: 103 mmol/L (ref 96–106)
Creatinine, Ser: 1.64 mg/dL — ABNORMAL HIGH (ref 0.76–1.27)
Globulin, Total: 2.6 g/dL (ref 1.5–4.5)
Glucose: 140 mg/dL — ABNORMAL HIGH (ref 65–99)
Potassium: 4.6 mmol/L (ref 3.5–5.2)
Sodium: 141 mmol/L (ref 134–144)
Total Protein: 6.9 g/dL (ref 6.0–8.5)
eGFR: 42 mL/min/1.73 — ABNORMAL LOW

## 2021-06-24 LAB — LIPID PANEL
Chol/HDL Ratio: 3.3 ratio (ref 0.0–5.0)
Cholesterol, Total: 112 mg/dL (ref 100–199)
HDL: 34 mg/dL — ABNORMAL LOW (ref >39)
LDL Chol Calc (NIH): 67 mg/dL (ref 0–99)
Triglycerides: 44 mg/dL (ref 0–149)
VLDL Cholesterol Cal: 11 mg/dL (ref 5–40)

## 2021-06-24 LAB — HEMOGLOBIN A1C
Est. average glucose Bld gHb Est-mCnc: 192 mg/dL
Hgb A1c MFr Bld: 8.3 % — ABNORMAL HIGH (ref 4.8–5.6)

## 2021-06-29 ENCOUNTER — Ambulatory Visit: Payer: Medicare HMO | Admitting: Family Medicine

## 2021-07-07 ENCOUNTER — Other Ambulatory Visit: Payer: Self-pay | Admitting: Family Medicine

## 2021-07-19 ENCOUNTER — Other Ambulatory Visit: Payer: Self-pay | Admitting: Family Medicine

## 2021-07-19 ENCOUNTER — Telehealth: Payer: Self-pay | Admitting: Family Medicine

## 2021-07-19 NOTE — Telephone Encounter (Signed)
Patient is requesting refill on lisinopril 5 mg used his last one today and has follow up on 9/13

## 2021-07-19 NOTE — Telephone Encounter (Signed)
Refill sent in earlier. Pt contacted and verbalized understanding.

## 2021-07-20 ENCOUNTER — Ambulatory Visit (INDEPENDENT_AMBULATORY_CARE_PROVIDER_SITE_OTHER): Payer: Medicare HMO | Admitting: Family Medicine

## 2021-07-20 ENCOUNTER — Encounter: Payer: Self-pay | Admitting: Family Medicine

## 2021-07-20 ENCOUNTER — Other Ambulatory Visit: Payer: Self-pay

## 2021-07-20 VITALS — BP 132/68 | HR 72 | Temp 97.9°F | Ht 71.0 in | Wt 154.0 lb

## 2021-07-20 DIAGNOSIS — D696 Thrombocytopenia, unspecified: Secondary | ICD-10-CM | POA: Diagnosis not present

## 2021-07-20 DIAGNOSIS — E1169 Type 2 diabetes mellitus with other specified complication: Secondary | ICD-10-CM | POA: Diagnosis not present

## 2021-07-20 DIAGNOSIS — I10 Essential (primary) hypertension: Secondary | ICD-10-CM

## 2021-07-20 DIAGNOSIS — E785 Hyperlipidemia, unspecified: Secondary | ICD-10-CM

## 2021-07-20 DIAGNOSIS — Z8546 Personal history of malignant neoplasm of prostate: Secondary | ICD-10-CM

## 2021-07-20 MED ORDER — METFORMIN HCL 500 MG PO TABS: 500.0000 mg | ORAL_TABLET | Freq: Two times a day (BID) | ORAL | 1 refills | Status: DC

## 2021-07-20 MED ORDER — AMLODIPINE BESYLATE 2.5 MG PO TABS: 2.5000 mg | ORAL_TABLET | Freq: Every day | ORAL | 1 refills | Status: DC

## 2021-07-20 MED ORDER — ATORVASTATIN CALCIUM 80 MG PO TABS: 80.0000 mg | ORAL_TABLET | Freq: Every day | ORAL | 1 refills | Status: DC

## 2021-07-20 MED ORDER — LISINOPRIL 5 MG PO TABS: 5.0000 mg | ORAL_TABLET | Freq: Every day | ORAL | 1 refills | Status: DC

## 2021-07-20 NOTE — Patient Instructions (Signed)
Plain cheerios or corn flakes or rice krispies Use artificial sweetener if needed for additive to the cereal or use blueberries  Redo your labs in 3 months with a recheckDiabetes Mellitus and Nutrition, Adult When you have diabetes, or diabetes mellitus, it is very important to have healthy eating habits because your blood sugar (glucose) levels are greatly affected by what you eat and drink. Eating healthy foods in the right amounts, at about the same times every day, can help you: Control your blood glucose. Lower your risk of heart disease. Improve your blood pressure. Reach or maintain a healthy weight. What can affect my meal plan? Every person with diabetes is different, and each person has different needs for a meal plan. Your health care provider may recommend that you work with a dietitian to make a meal plan that is best for you. Your meal plan may vary depending on factors such as: The calories you need. The medicines you take. Your weight. Your blood glucose, blood pressure, and cholesterol levels. Your activity level. Other health conditions you have, such as heart or kidney disease. How do carbohydrates affect me? Carbohydrates, also called carbs, affect your blood glucose level more than any other type of food. Eating carbs naturally raises the amount of glucose in your blood. Carb counting is a method for keeping track of how many carbs you eat. Counting carbs is important to keep your blood glucose at a healthy level, especially if you use insulin or take certain oral diabetes medicines. It is important to know how many carbs you can safely have in each meal. This is different for every person. Your dietitian can help you calculate how many carbs you should have at each meal and for each snack. How does alcohol affect me? Alcohol can cause a sudden decrease in blood glucose (hypoglycemia), especially if you use insulin or take certain oral diabetes medicines. Hypoglycemia can be  a life-threatening condition. Symptoms of hypoglycemia, such as sleepiness, dizziness, and confusion, are similar to symptoms of having too much alcohol. Do not drink alcohol if: Your health care provider tells you not to drink. You are pregnant, may be pregnant, or are planning to become pregnant. If you drink alcohol: Do not drink on an empty stomach. Limit how much you use to: 0-1 drink a day for women. 0-2 drinks a day for men. Be aware of how much alcohol is in your drink. In the [place] one drink equals one 12 oz bottle of beer (355 mL), one 5 oz glass of wine (148 mL), or one 1 oz glass of hard liquor (44 mL). Keep yourself hydrated with water, diet soda, or unsweetened iced tea. Keep in mind that regular soda, juice, and other mixers may contain a lot of sugar and must be counted as carbs. What are tips for following this plan? Reading food labels Start by checking the serving size on the "Nutrition Facts" label of packaged foods and drinks. The amount of calories, carbs, fats, and other nutrients listed on the label is based on one serving of the item. Many items contain more than one serving per package. Check the total grams (g) of carbs in one serving. You can calculate the number of servings of carbs in one serving by dividing the total carbs by 15. For example, if a food has 30 g of total carbs per serving, it would be equal to 2 servings of carbs. Check the number of grams (g) of saturated fats and trans fats in  one serving. Choose foods that have a low amount or none of these fats. Check the number of milligrams (mg) of salt (sodium) in one serving. Most people should limit total sodium intake to less than 2,300 mg per day. Always check the nutrition information of foods labeled as "low-fat" or "nonfat." These foods may be higher in added sugar or refined carbs and should be avoided. Talk to your dietitian to identify your daily goals for nutrients listed on the  label. Shopping Avoid buying canned, pre-made, or processed foods. These foods tend to be high in fat, sodium, and added sugar. Shop around the outside edge of the grocery store. This is where you will most often find fresh fruits and vegetables, bulk grains, fresh meats, and fresh dairy. Cooking Use low-heat cooking methods, such as baking, instead of high-heat cooking methods like deep frying. Cook using healthy oils, such as olive, canola, or sunflower oil. Avoid cooking with butter, cream, or high-fat meats. Meal planning Eat meals and snacks regularly, preferably at the same times every day. Avoid going long periods of time without eating. Eat foods that are high in fiber, such as fresh fruits, vegetables, beans, and whole grains. Talk with your dietitian about how many servings of carbs you can eat at each meal. Eat 4-6 oz (112-168 g) of lean protein each day, such as lean meat, chicken, fish, eggs, or tofu. One ounce (oz) of lean protein is equal to: 1 oz (28 g) of meat, chicken, or fish. 1 egg.  cup (62 g) of tofu. Eat some foods each day that contain healthy fats, such as avocado, nuts, seeds, and fish. What foods should I eat? Fruits Berries. Apples. Oranges. Peaches. Apricots. Plums. Grapes. Mango. Papaya. Pomegranate. Kiwi. Cherries. Vegetables Lettuce. Spinach. Leafy greens, including kale, chard, collard greens, and mustard greens. Beets. Cauliflower. Cabbage. Broccoli. Carrots. Green beans. Tomatoes. Peppers. Onions. Cucumbers. Brussels sprouts. Grains Whole grains, such as whole-wheat or whole-grain bread, crackers, tortillas, cereal, and pasta. Unsweetened oatmeal. Quinoa. Brown or wild rice. Meats and other proteins Seafood. Poultry without skin. Lean cuts of poultry and beef. Tofu. Nuts. Seeds. Dairy Low-fat or fat-free dairy products such as milk, yogurt, and cheese. The items listed above may not be a complete list of foods and beverages you can eat. Contact a  dietitian for more information. What foods should I avoid? Fruits Fruits canned with syrup. Vegetables Canned vegetables. Frozen vegetables with butter or cream sauce. Grains Refined white flour and flour products such as bread, pasta, snack foods, and cereals. Avoid all processed foods. Meats and other proteins Fatty cuts of meat. Poultry with skin. Breaded or fried meats. Processed meat. Avoid saturated fats. Dairy Full-fat yogurt, cheese, or milk. Beverages Sweetened drinks, such as soda or iced tea. The items listed above may not be a complete list of foods and beverages you should avoid. Contact a dietitian for more information. Questions to ask a health care provider Do I need to meet with a diabetes educator? Do I need to meet with a dietitian? What number can I call if I have questions? When are the best times to check my blood glucose? Where to find more information: American Diabetes Association: diabetes.org Academy of Nutrition and Dietetics: www.eatright.[employer] of Diabetes and Digestive and Kidney Diseases: [employer] Association of Diabetes Care and Education Specialists: www.diabeteseducator.org Summary It is important to have healthy eating habits because your blood sugar (glucose) levels are greatly affected by what you eat and drink. A healthy meal plan  will help you control your blood glucose and maintain a healthy lifestyle. Your health care provider may recommend that you work with a dietitian to make a meal plan that is best for you. Keep in mind that carbohydrates (carbs) and alcohol have immediate effects on your blood glucose levels. It is important to count carbs and to use alcohol carefully. This information is not intended to replace advice given to you by your health care provider. Make sure you discuss any questions you have with your health care provider. Document Revised: 10/01/2019 Document Reviewed: 10/01/2019 Elsevier Patient  Education  2021 [employer].

## 2021-07-20 NOTE — Progress Notes (Signed)
   Subjective:    Patient ID: Charles Medina, male    DOB: 1941/09/28, 80 y.o.   MRN: [id number]  Hypertension  Diabetes  Patient for blood pressure check up.  The patient does have hypertension.    Medication compliance-relates compliance  Blood pressure control recently-does not check blood pressure outside the office  Dietary compliance-tries to eat relatively healthy stays active with walking occasionally goes to the gym  The patient was seen today as part of a comprehensive diabetic check up. Patient has diabetes  Compliance-compliant with diabetic medicine Low sugars-denies low sugars Dietary effort-eats sugary cereals-patient was encouraged to avoid those Foot exam and ophthalmology exam requirements were reviewed     Review of Systems     Objective:   Physical Exam General-in no acute distress Eyes-no discharge Lungs-respiratory rate normal, CTA CV-no murmurs,RRR Extremities skin warm dry no edema Neuro grossly normal Behavior normal, alert        Assessment & Plan:  1. Primary hypertension Blood pressure good on recheck continue current measures follow creatinine closely  2. Hyperlipidemia associated with type 2 diabetes mellitus (Charles Medina) Cholesterol under reasonable control takes his statin  His A1c is not at goal.  We will need to follow his creatinine closely if it goes up further or if GFR goes down further may need to stop metformin currently may continue as is.  Avoid sugary cereals.  Recheck A1c again in 3 months.  Consider Farxiga on follow-up  3. History of prostate cancer We will check a PSA is been over 1 year  4. Thrombocytopenia (Charles Medina) Will recheck CBC to see where platelet count is no bruising or bleeding currently  Labs in 3 months with follow-up office visit

## 2021-09-14 ENCOUNTER — Other Ambulatory Visit: Payer: Self-pay

## 2021-09-14 ENCOUNTER — Ambulatory Visit (INDEPENDENT_AMBULATORY_CARE_PROVIDER_SITE_OTHER): Payer: Medicare HMO

## 2021-09-14 VITALS — BP 132/72 | HR 77 | Ht 71.0 in | Wt 153.6 lb

## 2021-09-14 DIAGNOSIS — Z23 Encounter for immunization: Secondary | ICD-10-CM | POA: Diagnosis not present

## 2021-09-14 DIAGNOSIS — Z0000 Encounter for general adult medical examination without abnormal findings: Secondary | ICD-10-CM | POA: Diagnosis not present

## 2021-09-14 NOTE — Patient Instructions (Signed)
Mr. Charles Medina , Thank you for taking time to come for your Medicare Wellness Visit. I appreciate your ongoing commitment to your health goals. Please review the following plan we discussed and let me know if I can assist you in the future.   Screening recommendations/referrals: Colonoscopy: Done 12/20/2019. No longer required.   Recommended yearly ophthalmology/optometry visit for glaucoma screening and checkup Recommended yearly dental visit for hygiene and checkup  Vaccinations: Influenza vaccine: 09/14/2021 Pneumococcal vaccine: Done 05/13/2014 and 06/26/2015 Tdap vaccine: Due Repeat in 10 years  Shingles vaccine: Done 08/26/2019   Covid-19: Done 01/07/2020 and 02/04/2020  Advanced directives: Please bring a copy of your health care power of attorney and living will to the office to be added to your chart at your convenience.   Conditions/risks identified: Aim for 30 minutes of exercise or brisk walking each day, drink 6-8 glasses of water and eat lots of fruits and vegetables. KEEP UP THE GOOD WORK!!!  Next appointment: Follow up in one year for your annual wellness visit. 2023.  Preventive Care 65 Years and Older, Male  Preventive care refers to lifestyle choices and visits with your health care provider that can promote health and wellness. What does preventive care include? A yearly physical exam. This is also called an annual well check. Dental exams once or twice a year. Routine eye exams. Ask your health care provider how often you should have your eyes checked. Personal lifestyle choices, including: Daily care of your teeth and gums. Regular physical activity. Eating a healthy diet. Avoiding tobacco and drug use. Limiting alcohol use. Practicing safe sex. Taking low doses of aspirin every day. Taking vitamin and mineral supplements as recommended by your health care provider. What happens during an annual well check? The services and screenings done by your health care  provider during your annual well check will depend on your age, overall health, lifestyle risk factors, and family history of disease. Counseling  Your health care provider may ask you questions about your: Alcohol use. Tobacco use. Drug use. Emotional well-being. Home and relationship well-being. Sexual activity. Eating habits. History of falls. Memory and ability to understand (cognition). Work and work Statistician. Screening  You may have the following tests or measurements: Height, weight, and BMI. Blood pressure. Lipid and cholesterol levels. These may be checked every 5 years, or more frequently if you are over 56 years old. Skin check. Lung cancer screening. You may have this screening every year starting at age 27 if you have a 30-pack-year history of smoking and currently smoke or have quit within the past 15 years. Fecal occult blood test (FOBT) of the stool. You may have this test every year starting at age 41. Flexible sigmoidoscopy or colonoscopy. You may have a sigmoidoscopy every 5 years or a colonoscopy every 10 years starting at age 23. Prostate cancer screening. Recommendations will vary depending on your family history and other risks. Hepatitis C blood test. Hepatitis B blood test. Sexually transmitted disease (STD) testing. Diabetes screening. This is done by checking your blood sugar (glucose) after you have not eaten for a while (fasting). You may have this done every 1-3 years. Abdominal aortic aneurysm (AAA) screening. You may need this if you are a current or former smoker. Osteoporosis. You may be screened starting at age 93 if you are at high risk. Talk with your health care provider about your test results, treatment options, and if necessary, the need for more tests. Vaccines  Your health care provider may recommend  certain vaccines, such as: Influenza vaccine. This is recommended every year. Tetanus, diphtheria, and acellular pertussis (Tdap, Td)  vaccine. You may need a Td booster every 10 years. Zoster vaccine. You may need this after age 27. Pneumococcal 13-valent conjugate (PCV13) vaccine. One dose is recommended after age 41. Pneumococcal polysaccharide (PPSV23) vaccine. One dose is recommended after age 36. Talk to your health care provider about which screenings and vaccines you need and how often you need them. This information is not intended to replace advice given to you by your health care provider. Make sure you discuss any questions you have with your health care provider. Document Released: 11/20/2015 Document Revised: 07/13/2016 Document Reviewed: 08/25/2015 Elsevier Interactive Patient Education  2017 [place] Prevention in the Home Falls can cause injuries. They can happen to people of all ages. There are many things you can do to make your home safe and to help prevent falls. What can I do on the outside of my home? Regularly fix the edges of walkways and driveways and fix any cracks. Remove anything that might make you trip as you walk through a door, such as a raised step or threshold. Trim any bushes or trees on the path to your home. Use bright outdoor lighting. Clear any walking paths of anything that might make someone trip, such as rocks or tools. Regularly check to see if handrails are loose or broken. Make sure that both sides of any steps have handrails. Any raised decks and porches should have guardrails on the edges. Have any leaves, snow, or ice cleared regularly. Use sand or salt on walking paths during winter. Clean up any spills in your garage right away. This includes oil or grease spills. What can I do in the bathroom? Use night lights. Install grab bars by the toilet and in the tub and shower. Do not use towel bars as grab bars. Use non-skid mats or decals in the tub or shower. If you need to sit down in the shower, use a plastic, non-slip stool. Keep the floor dry. Clean up any  water that spills on the floor as soon as it happens. Remove soap buildup in the tub or shower regularly. Attach bath mats securely with double-sided non-slip rug tape. Do not have throw rugs and other things on the floor that can make you trip. What can I do in the bedroom? Use night lights. Make sure that you have a light by your bed that is easy to reach. Do not use any sheets or blankets that are too big for your bed. They should not hang down onto the floor. Have a firm chair that has side arms. You can use this for support while you get dressed. Do not have throw rugs and other things on the floor that can make you trip. What can I do in the kitchen? Clean up any spills right away. Avoid walking on wet floors. Keep items that you use a lot in easy-to-reach places. If you need to reach something above you, use a strong step stool that has a grab bar. Keep electrical cords out of the way. Do not use floor polish or wax that makes floors slippery. If you must use wax, use non-skid floor wax. Do not have throw rugs and other things on the floor that can make you trip. What can I do with my stairs? Do not leave any items on the stairs. Make sure that there are handrails on both sides of the  stairs and use them. Fix handrails that are broken or loose. Make sure that handrails are as long as the stairways. Check any carpeting to make sure that it is firmly attached to the stairs. Fix any carpet that is loose or worn. Avoid having throw rugs at the top or bottom of the stairs. If you do have throw rugs, attach them to the floor with carpet tape. Make sure that you have a light switch at the top of the stairs and the bottom of the stairs. If you do not have them, ask someone to add them for you. What else can I do to help prevent falls? Wear shoes that: Do not have high heels. Have rubber bottoms. Are comfortable and fit you well. Are closed at the toe. Do not wear sandals. If you use a  stepladder: Make sure that it is fully opened. Do not climb a closed stepladder. Make sure that both sides of the stepladder are locked into place. Ask someone to hold it for you, if possible. Clearly mark and make sure that you can see: Any grab bars or handrails. First and last steps. Where the edge of each step is. Use tools that help you move around (mobility aids) if they are needed. These include: Canes. Walkers. Scooters. Crutches. Turn on the lights when you go into a dark area. Replace any light bulbs as soon as they burn out. Set up your furniture so you have a clear path. Avoid moving your furniture around. If any of your floors are uneven, fix them. If there are any pets around you, be aware of where they are. Review your medicines with your doctor. Some medicines can make you feel dizzy. This can increase your chance of falling. Ask your doctor what other things that you can do to help prevent falls. This information is not intended to replace advice given to you by your health care provider. Make sure you discuss any questions you have with your health care provider. Document Released: 08/20/2009 Document Revised: 03/31/2016 Document Reviewed: 11/28/2014 Elsevier Interactive Patient Education  2017 [employer].

## 2021-09-14 NOTE — Progress Notes (Signed)
Subjective:   Charles Medina is a 80 y.o. male who presents for Medicare Annual/Subsequent preventive examination.  Review of Systems     Cardiac Risk Factors include: advanced age (>21men, >52 women);diabetes mellitus;hypertension;dyslipidemia;male gender     Objective:    Today's Vitals   09/14/21 0939  Weight: 153 lb 9.6 oz (69.7 kg)  Height: 5\' 11"  (1.803 m)   Body mass index is 21.42 kg/m.  Advanced Directives 09/14/2021 06/02/2016 04/11/2016 08/07/2015 06/25/2015 06/23/2015 06/12/2015  Does Patient Have a Medical Advance Directive? Yes Yes No;Yes Yes Yes Yes Yes  Type of Paramedic of [place];Living will [place];Living will Living will Living will;Healthcare Power of [place];Living will [place];Living will [place];Living will  Does patient want to make changes to medical advance directive? - - - No - Patient declined - No - Patient declined No - Patient declined  Copy of [place] in Chart? No - copy requested - - No - copy requested - No - copy requested No - copy requested  Would patient like information on creating a medical advance directive? No - Patient declined - - - - - -  Pre-existing out of facility DNR order (yellow form or pink MOST form) - - - - - - -    Current Medications (verified) Outpatient Encounter Medications as of 09/14/2021  Medication Sig   amLODipine (NORVASC) 2.5 MG tablet Take 1 tablet (2.5 mg total) by mouth daily.   aspirin 81 MG tablet Take 81 mg by mouth daily.   atorvastatin (LIPITOR) 80 MG tablet Take 1 tablet (80 mg total) by mouth daily.   famotidine (PEPCID) 40 MG tablet Take 1 tablet (40 mg total) by mouth at bedtime.   lisinopril (ZESTRIL) 5 MG tablet Take 1 tablet (5 mg total) by mouth daily.   metFORMIN (GLUCOPHAGE) 500 MG tablet Take 1 tablet (500 mg total) by mouth 2 (two) times daily with a meal.    Multiple Vitamins-Minerals (EMERGEN-C IMMUNE PO) Take 1 packet by mouth daily as needed (immune health/energy).   sildenafil (REVATIO) 20 MG tablet TAKE 3 TO 5 TABLETS AS NEEDED AND INSTRUCTED.   No facility-administered encounter medications on file as of 09/14/2021.    Allergies (verified) Patient has no known allergies.   History: Past Medical History:  Diagnosis Date   AAA (abdominal aortic aneurysm)    Arthritis    Diabetes mellitus without complication (HCC)    Hypertension    Prostate cancer ([place])    Type 2 diabetes mellitus ([place])    Past Surgical History:  Procedure Laterality Date   ABDOMINAL AORTIC ENDOVASCULAR STENT GRAFT N/A 06/25/2015   Procedure: ABDOMINAL AORTIC ENDOVASCULAR STENT GRAFT;  Surgeon: Conrad Rushville, MD;  Location: [facility];  Service: Vascular;  Laterality: N/A;   BIOPSY  12/20/2019   Procedure: BIOPSY;  Surgeon: Rogene Houston, MD;  [facility];  Service: Endoscopy;;  esophagus   COLONOSCOPY  11/14/2012   Procedure: COLONOSCOPY;  Surgeon: Rogene Houston, MD;  [facility];  Service: Endoscopy;  Laterality: N/A;  830   COLONOSCOPY WITH PROPOFOL N/A 12/20/2019   Procedure: COLONOSCOPY WITH PROPOFOL;  Surgeon: Rogene Houston, MD;  [facility];  Service: Endoscopy;  Laterality: N/A;  940   ESOPHAGOGASTRODUODENOSCOPY (EGD) WITH PROPOFOL N/A 12/20/2019   Procedure: ESOPHAGOGASTRODUODENOSCOPY (EGD) WITH PROPOFOL;  Surgeon: Rogene Houston, MD;  [facility];  Service: Endoscopy;  Laterality: N/A;   POLYPECTOMY  12/20/2019   Procedure: POLYPECTOMY;  Surgeon: Rogene Houston, MD;  [facility];  Service: Endoscopy;;  colon   PROSTATECTOMY  2011   Family History  Family history unknown: Yes   Social History   Socioeconomic History   Marital status: Widowed    Spouse name: Not on file   Number of children: 6   Years of education: Not on file   Highest education level: Not on file  Occupational History    Not on file  Tobacco Use   Smoking status: Former    Packs/day: 0.50    Years: 23.00    Pack years: 11.50    Types: Cigarettes    Start date: 05/08/1956    Quit date: 11/15/1971    Years since quitting: 49.8   Smokeless tobacco: Never  Substance and Sexual Activity   Alcohol use: No    Alcohol/week: 0.0 standard drinks   Drug use: No   Sexual activity: Yes  Other Topics Concern   Not on file  Social History Narrative   28 grandchildren   6 great grandchildren   Social Determinants of Health   Financial Resource Strain: Low Risk    Difficulty of Paying Living Expenses: Not hard at all  Food Insecurity: No Food Insecurity   Worried About Charity fundraiser in the Last Year: Never true   [place] in the Last Year: Never true  Transportation Needs: No Transportation Needs   Lack of Transportation (Medical): No   Lack of Transportation (Non-Medical): No  Physical Activity: Sufficiently Active   Days of Exercise per Week: 5 days   Minutes of Exercise per Session: 60 min  Stress: No Stress Concern Present   Feeling of Stress : Not at all  Social Connections: Moderately Integrated   Frequency of Communication with Friends and Family: More than three times a week   Frequency of Social Gatherings with Friends and Family: More than three times a week   Attends Religious Services: More than 4 times per year   Active Member of [employer] or Organizations: Yes   Attends Archivist Meetings: More than 4 times per year   Marital Status: Widowed    Tobacco Counseling Counseling given: Not Answered   Clinical Intake:  Pre-visit preparation completed: Yes  Pain : No/denies pain     BMI - recorded: 21.42 Nutritional Status: BMI of 19-24  Normal Nutritional Risks: None Diabetes: Yes  How often do you need to have someone help you when you read instructions, pamphlets, or other written materials from your doctor or pharmacy?: 1 - Never  Diabetic?Nutrition Risk  Assessment:  Has the patient had any N/V/D within the last 2 months?  No  Does the patient have any non-healing wounds?  No  Has the patient had any unintentional weight loss or weight gain?  No   Diabetes:  Is the patient diabetic?  Yes  If diabetic, was a CBG obtained today?  No  Did the patient bring in their glucometer from home?  No  How often do you monitor your CBG's? Daily.   Financial Strains and Diabetes Management:  Are you having any financial strains with the device, your supplies or your medication? No .  Does the patient want to be seen by Chronic Care Management for management of their diabetes?  No  Would the patient like to be referred to a Nutritionist or for Diabetic Management?  No   Diabetic Exams:  Diabetic Eye Exam: Completed 11/11/2020. Pt has been advised about the importance in completing this exam. A Diabetic Foot Exam: Completed 07/20/2021. Pt has been advised about the importance in completing this exam.   Interpreter Needed?: No  Information entered by :: MJ Ajmal Kathan, LPN   Activities of Daily Living In your present state of health, do you have any difficulty performing the following activities: 09/14/2021  Hearing? N  Vision? N  Difficulty concentrating or making decisions? N  Walking or climbing stairs? N  Dressing or bathing? N  Doing errands, shopping? N  Preparing Food and eating ? N  Using the Toilet? N  In the past six months, have you accidently leaked urine? N  Do you have problems with loss of bowel control? N  Managing your Medications? N  Managing your Finances? N  Housekeeping or managing your Housekeeping? N  Some recent data might be hidden    Patient Care Team: Kathyrn Drown, MD as PCP - General (Family Medicine)  Indicate any recent Medical Services you may have received from other than [facility] providers in the past year (date may be approximate).     Assessment:   This is a routine wellness examination for  Charles Medina.  Hearing/Vision screen Hearing Screening - Comments:: No hearing issues. Vision Screening - Comments:: [facility], 11/11/2020  Dietary issues and exercise activities discussed: Current Exercise Habits: Home exercise routine, Type of exercise: walking, Time (Minutes): 60, Frequency (Times/Week): 5, Weekly Exercise (Minutes/Week): 300, Intensity: Mild, Exercise limited by: cardiac condition(s);orthopedic condition(s)   Goals Addressed             This Visit's Progress    Exercise 3x per week (30 min per time)       Keep up exercise.        Depression Screen PHQ 2/9 Scores 09/14/2021 07/20/2021 01/27/2021 09/22/2020 09/22/2020 09/19/2018 03/19/2018  PHQ - 2 Score 0 0 0 0 0 0 0    Fall Risk Fall Risk  09/14/2021 07/20/2021 01/27/2021 09/22/2020 02/25/2020  Falls in the past year? 0 0 0 0 0  Number falls in past yr: 0 0 - - -  Injury with Fall? 0 0 - - -  Risk for fall due to : No Fall Risks No Fall Risks - - History of fall(s)  Follow up Falls prevention discussed Falls evaluation completed Falls evaluation completed Falls evaluation completed Falls evaluation completed    [place]:  Any stairs in or around the home? Yes  If so, are there any without handrails? No  Home free of loose throw rugs in walkways, pet beds, electrical cords, etc? Yes  Adequate lighting in your home to reduce risk of falls? Yes   ASSISTIVE DEVICES UTILIZED TO PREVENT FALLS:  Life alert? No  Use of a cane, walker or w/c? No  Grab bars in the bathroom? No  Shower chair or bench in shower? No  Elevated toilet seat or a handicapped toilet? No   TIMED UP AND GO:  Was the test performed? Yes .  Length of time to ambulate 10 feet: 8 sec.   Gait steady and fast without use of assistive device  Cognitive Function: Normal cognitive status assessed by direct observation by this Nurse Health Advisor. No abnormalities found.           Immunizations Immunization History  Administered Date(s) Administered   DT (Pediatric) 12/12/2013   Fluad Quad(high Dose  65+) 09/22/2020   Influenza,inj,Quad PF,6+ Mos 08/27/2014, 09/21/2015, 08/11/2016, 10/02/2017, 09/19/2018, 07/23/2019   Moderna Sars-Covid-2 Vaccination 01/07/2020, 02/04/2020   Pneumococcal Conjugate-13 05/13/2014   Pneumococcal Polysaccharide-23 06/26/2015   Zoster Recombinat (Shingrix) 08/26/2019    TDAP status: Due, Education has been provided regarding the importance of this vaccine. Advised may receive this vaccine at local pharmacy or Health Dept. Aware to provide a copy of the vaccination record if obtained from local pharmacy or Health Dept. Verbalized acceptance and understanding.  Flu Vaccine status: Completed at today's visit  Pneumococcal vaccine status: Up to date  Covid-19 vaccine status: Information provided on how to obtain vaccines.   Qualifies for Shingles Vaccine? Yes   Zostavax completed Yes   Shingrix Completed?: No.    Education has been provided regarding the importance of this vaccine. Patient has been advised to call insurance company to determine out of pocket expense if they have not yet received this vaccine. Advised may also receive vaccine at local pharmacy or Health Dept. Verbalized acceptance and understanding.  Screening Tests Health Maintenance  Topic Date Due   TETANUS/TDAP  Never done   Zoster Vaccines- Shingrix (2 of 2) 10/21/2019   INFLUENZA VACCINE  06/07/2021   OPHTHALMOLOGY EXAM  11/11/2021   HEMOGLOBIN A1C  12/24/2021   FOOT EXAM  07/20/2022   COLONOSCOPY (Pts 45-65yrs Insurance coverage will need to be confirmed)  12/19/2024   Pneumonia Vaccine 83+ Years old  Completed   HPV VACCINES  Aged Out   COVID-19 Vaccine  Discontinued    Health Maintenance  Health Maintenance Due  Topic Date Due   TETANUS/TDAP  Never done   Zoster Vaccines- Shingrix (2 of 2) 10/21/2019   INFLUENZA VACCINE  06/07/2021     Colorectal cancer screening: Type of screening: Colonoscopy. Completed 12/20/2019. Repeat every 5 years  Lung Cancer Screening: (Low Dose CT Chest recommended if Age 15-80 years, 30 pack-year currently smoking OR have quit w/in 15years.) does not qualify.     Additional Screening:  Hepatitis C Screening: does not qualify;   Vision Screening: Recommended annual ophthalmology exams for early detection of glaucoma and other disorders of the eye. Is the patient up to date with their annual eye exam?  Yes  Who is the provider or what is the name of the office in which the patient attends annual eye exams? Dr. Jorja Loa If pt is not established with a provider, would they like to be referred to a provider to establish care? No .   Dental Screening: Recommended annual dental exams for proper oral hygiene  Community Resource Referral / Chronic Care Management: CRR required this visit?  No   CCM required this visit?  No      Plan:     I have personally reviewed and noted the following in the patient's chart:   Medical and social history Use of alcohol, tobacco or illicit drugs  Current medications and supplements including opioid prescriptions. Patient is not currently taking opioid prescriptions. Functional ability and status Nutritional status Physical activity Advanced directives List of other physicians Hospitalizations, surgeries, and ER visits in previous 12 months Vitals Screenings to include cognitive, depression, and falls Referrals and appointments  In addition, I have reviewed and discussed with patient certain preventive protocols, quality metrics, and best practice recommendations. A written personalized care plan for preventive services as well as general preventive health recommendations were provided to patient.     Chriss Driver, LPN   12/14/2534   Nurse Notes: Up  to date on health maintenance. Will bring Covid vaccination card to visit on 09/19/21 to up  date immunizations. Flu vaccine given today.

## 2021-10-15 DIAGNOSIS — E785 Hyperlipidemia, unspecified: Secondary | ICD-10-CM | POA: Diagnosis not present

## 2021-10-15 DIAGNOSIS — I10 Essential (primary) hypertension: Secondary | ICD-10-CM | POA: Diagnosis not present

## 2021-10-15 DIAGNOSIS — E1169 Type 2 diabetes mellitus with other specified complication: Secondary | ICD-10-CM | POA: Diagnosis not present

## 2021-10-15 DIAGNOSIS — Z8546 Personal history of malignant neoplasm of prostate: Secondary | ICD-10-CM | POA: Diagnosis not present

## 2021-10-15 DIAGNOSIS — D696 Thrombocytopenia, unspecified: Secondary | ICD-10-CM | POA: Diagnosis not present

## 2021-10-16 LAB — CBC WITH DIFFERENTIAL/PLATELET
Basophils Absolute: 0 10*3/uL (ref 0.0–0.2)
Basos: 1 %
EOS (ABSOLUTE): 0.1 10*3/uL (ref 0.0–0.4)
Eos: 2 %
Hematocrit: 37.4 % — ABNORMAL LOW (ref 37.5–51.0)
Hemoglobin: 12.8 g/dL — ABNORMAL LOW (ref 13.0–17.7)
Immature Grans (Abs): 0 10*3/uL (ref 0.0–0.1)
Immature Granulocytes: 0 %
Lymphocytes Absolute: 1.2 10*3/uL (ref 0.7–3.1)
Lymphs: 24 %
MCH: 31.1 pg (ref 26.6–33.0)
MCHC: 34.2 g/dL (ref 31.5–35.7)
MCV: 91 fL (ref 79–97)
Monocytes Absolute: 0.5 10*3/uL (ref 0.1–0.9)
Monocytes: 10 %
Neutrophils Absolute: 3.2 10*3/uL (ref 1.4–7.0)
Neutrophils: 63 %
Platelets: 139 10*3/uL — ABNORMAL LOW (ref 150–450)
RBC: 4.12 x10E6/uL — ABNORMAL LOW (ref 4.14–5.80)
RDW: 12.5 % (ref 11.6–15.4)
WBC: 5.1 10*3/uL (ref 3.4–10.8)

## 2021-10-16 LAB — BASIC METABOLIC PANEL
BUN/Creatinine Ratio: 16 (ref 10–24)
BUN: 29 mg/dL — ABNORMAL HIGH (ref 8–27)
CO2: 19 mmol/L — ABNORMAL LOW (ref 20–29)
Calcium: 9.8 mg/dL (ref 8.6–10.2)
Chloride: 103 mmol/L (ref 96–106)
Creatinine, Ser: 1.77 mg/dL — ABNORMAL HIGH (ref 0.76–1.27)
Glucose: 181 mg/dL — ABNORMAL HIGH (ref 70–99)
Potassium: 6.5 mmol/L — ABNORMAL HIGH (ref 3.5–5.2)
Sodium: 140 mmol/L (ref 134–144)
eGFR: 38 mL/min/{1.73_m2} — ABNORMAL LOW (ref >59)

## 2021-10-16 LAB — HEMOGLOBIN A1C
Est. average glucose Bld gHb Est-mCnc: 186 mg/dL
Hgb A1c MFr Bld: 8.1 % — ABNORMAL HIGH (ref 4.8–5.6)

## 2021-10-16 LAB — PSA: Prostate Specific Ag, Serum: 1.1 ng/mL (ref 0.0–4.0)

## 2021-10-17 ENCOUNTER — Other Ambulatory Visit: Payer: Self-pay | Admitting: Family Medicine

## 2021-10-18 ENCOUNTER — Other Ambulatory Visit: Payer: Self-pay

## 2021-10-18 ENCOUNTER — Telehealth: Payer: Self-pay

## 2021-10-18 DIAGNOSIS — E1169 Type 2 diabetes mellitus with other specified complication: Secondary | ICD-10-CM

## 2021-10-18 DIAGNOSIS — E875 Hyperkalemia: Secondary | ICD-10-CM

## 2021-10-18 DIAGNOSIS — E119 Type 2 diabetes mellitus without complications: Secondary | ICD-10-CM

## 2021-10-18 DIAGNOSIS — E785 Hyperlipidemia, unspecified: Secondary | ICD-10-CM

## 2021-10-18 MED ORDER — LOKELMA 10 G PO PACK: 10.0000 g | PACK | Freq: Three times a day (TID) | ORAL | 0 refills | Status: DC

## 2021-10-18 MED ORDER — LOKELMA 10 G PO PACK: PACK | ORAL | 0 refills | Status: DC

## 2021-10-18 NOTE — Telephone Encounter (Signed)
Spoke with Amy at [facility] , she verified they would still be able to keep prescription as it was ordered for 10 g dose, but doubling packets due to only having 5 g available only.

## 2021-10-18 NOTE — Telephone Encounter (Signed)
It would be fine to use a 5 g packet but when they be able to double it up so it would equal 10 g? If not then 5 g 3 times daily for 3 days then one 5 g twice daily for a week after that

## 2021-10-18 NOTE — Telephone Encounter (Signed)
[facility] calls back to say they do not have the 10 g lokelma , but do have to 5 g packets , is it ok to give this one instead , please advise

## 2021-10-18 NOTE — Telephone Encounter (Signed)
Sounds good thank you

## 2021-10-19 ENCOUNTER — Ambulatory Visit (INDEPENDENT_AMBULATORY_CARE_PROVIDER_SITE_OTHER): Payer: Medicare HMO | Admitting: Family Medicine

## 2021-10-19 ENCOUNTER — Other Ambulatory Visit: Payer: Self-pay

## 2021-10-19 VITALS — BP 124/74 | HR 78 | Ht 71.0 in | Wt 151.4 lb

## 2021-10-19 DIAGNOSIS — I10 Essential (primary) hypertension: Secondary | ICD-10-CM | POA: Diagnosis not present

## 2021-10-19 DIAGNOSIS — E785 Hyperlipidemia, unspecified: Secondary | ICD-10-CM | POA: Diagnosis not present

## 2021-10-19 DIAGNOSIS — E1169 Type 2 diabetes mellitus with other specified complication: Secondary | ICD-10-CM

## 2021-10-19 DIAGNOSIS — E875 Hyperkalemia: Secondary | ICD-10-CM

## 2021-10-19 MED ORDER — SITAGLIPTIN PHOSPHATE 50 MG PO TABS
50.0000 mg | ORAL_TABLET | Freq: Every day | ORAL | 5 refills | Status: DC
Start: 2021-10-19 — End: 2021-11-30

## 2021-10-19 NOTE — Progress Notes (Signed)
   Subjective:    Patient ID: Charles Medina, male    DOB: 03-14-1941, 80 y.o.   MRN: [id number]  Diabetes He presents for his follow-up diabetic visit. He has type 2 diabetes mellitus. Risk factors for coronary artery disease include diabetes mellitus and hypertension. Current diabetic treatment includes oral agent (monotherapy). He is following a diabetic diet.  Discuss recent labs No diagnosis found.   Review of Systems     Objective:   Physical Exam General-in no acute distress Eyes-no discharge Lungs-respiratory rate normal, CTA CV-no murmurs,RRR Extremities skin warm dry no edema Neuro grossly normal Behavior normal, alert        Assessment & Plan:   1. Hyperkalemia Treating with [place] Stop lisinopril Avoid beet juice as well as bananas dietary information given Repeat again in 1 week's time May need nephrology consult depending on ongoing issues  2. Primary hypertension Blood pressure good control stop lisinopril follow-up again in 6 weeks  3. Hyperlipidemia associated with type 2 diabetes mellitus ([place]) Under fair control could be better add Januvia 50 mg 1 daily this is a renal dose.  Continue metformin as is check lab work frequently if kidney function worsens will need to cut back on metformin or completely stop

## 2021-10-19 NOTE — Patient Instructions (Signed)
Hi Charles Medina It was good to see you today.  Please follow the low potassium diet. Stop lisinopril. Use the [facility] as we discussed. Do your lab work in 1 week Follow-up here in 6 weeks  We also sent in [place] 1 daily to help with your diabetes Call us if any questions TakeCare-Dr. Nicki Reaper

## 2021-10-19 NOTE — Progress Notes (Deleted)
5

## 2021-11-04 DIAGNOSIS — E875 Hyperkalemia: Secondary | ICD-10-CM | POA: Diagnosis not present

## 2021-11-05 LAB — BASIC METABOLIC PANEL
BUN/Creatinine Ratio: 12 (ref 10–24)
BUN: 22 mg/dL (ref 8–27)
CO2: 24 mmol/L (ref 20–29)
Calcium: 9.4 mg/dL (ref 8.6–10.2)
Chloride: 103 mmol/L (ref 96–106)
Creatinine, Ser: 1.77 mg/dL — ABNORMAL HIGH (ref 0.76–1.27)
Glucose: 113 mg/dL — ABNORMAL HIGH (ref 70–99)
Potassium: 4.2 mmol/L (ref 3.5–5.2)
Sodium: 141 mmol/L (ref 134–144)
eGFR: 38 mL/min/{1.73_m2} — ABNORMAL LOW (ref >59)

## 2021-11-09 MED ORDER — METFORMIN HCL 500 MG PO TABS: 500.0000 mg | ORAL_TABLET | Freq: Every day | ORAL | 0 refills | Status: DC

## 2021-11-09 NOTE — Addendum Note (Signed)
Addended by: Dairl Ponder on: 11/09/2021 10:16 AM   Modules accepted: Orders

## 2021-11-09 NOTE — Addendum Note (Signed)
Addended by: Dairl Ponder on: 11/09/2021 10:05 AM   Modules accepted: Orders

## 2021-11-30 ENCOUNTER — Other Ambulatory Visit: Payer: Self-pay

## 2021-11-30 ENCOUNTER — Ambulatory Visit (INDEPENDENT_AMBULATORY_CARE_PROVIDER_SITE_OTHER): Payer: Medicare HMO | Admitting: Family Medicine

## 2021-11-30 VITALS — BP 158/92 | HR 63 | Temp 97.0°F | Ht 71.0 in | Wt 148.8 lb

## 2021-11-30 DIAGNOSIS — E1169 Type 2 diabetes mellitus with other specified complication: Secondary | ICD-10-CM

## 2021-11-30 DIAGNOSIS — E785 Hyperlipidemia, unspecified: Secondary | ICD-10-CM

## 2021-11-30 DIAGNOSIS — I10 Essential (primary) hypertension: Secondary | ICD-10-CM | POA: Diagnosis not present

## 2021-11-30 MED ORDER — SITAGLIPTIN PHOSPHATE 50 MG PO TABS: 50.0000 mg | ORAL_TABLET | Freq: Every day | ORAL | 5 refills | Status: DC

## 2021-11-30 MED ORDER — AMLODIPINE BESYLATE 5 MG PO TABS: 5.0000 mg | ORAL_TABLET | Freq: Every day | ORAL | 5 refills | Status: DC

## 2021-11-30 NOTE — Progress Notes (Signed)
° °  Subjective:    Patient ID: Charles Medina, male    DOB: June 01, 1941, 81 y.o.   MRN: [id number]  HPI  Patient has been keeping track of blood sugars and blood pressure. He states readings have been good. Primary hypertension  Hyperlipidemia associated with type 2 diabetes mellitus ([place]) Patient's blood pressure not under good control today He states he is taking his medications We did stop lisinopril recently because his creatinine was elevated Also had to stop his metformin because of concerns of toxicity related to low GFR.  Patient is trying to maintain a healthy diet as best he can  He does not know his medications.  We are discussing them he seems to be aware of what he is taking to some degree  He does try to eat healthy and minimize potassium intake Review of Systems     Objective:   Physical Exam General-in no acute distress Eyes-no discharge Lungs-respiratory rate normal, CTA CV-no murmurs,RRR Extremities skin warm dry no edema Neuro grossly normal Behavior normal, alert        Assessment & Plan:  Hyperkalemia-resolved after modifications of medications and diet Lab work to be done first part of March Follow-up office visit with myself by mid March Bump up dose of amlodipine to get blood pressure down May need further adjusting Continue cholesterol medicine Take Januvia 50 mg daily for diabetes Apparently he did not get that filled  Nurse visit in 2 weeks time to recheck blood pressure

## 2021-11-30 NOTE — Patient Instructions (Signed)

## 2021-12-09 ENCOUNTER — Other Ambulatory Visit: Payer: Self-pay | Admitting: Family Medicine

## 2021-12-14 ENCOUNTER — Other Ambulatory Visit: Payer: Medicare HMO

## 2022-01-14 DIAGNOSIS — E785 Hyperlipidemia, unspecified: Secondary | ICD-10-CM | POA: Diagnosis not present

## 2022-01-14 DIAGNOSIS — E1169 Type 2 diabetes mellitus with other specified complication: Secondary | ICD-10-CM | POA: Diagnosis not present

## 2022-01-14 DIAGNOSIS — E119 Type 2 diabetes mellitus without complications: Secondary | ICD-10-CM | POA: Diagnosis not present

## 2022-01-14 DIAGNOSIS — E875 Hyperkalemia: Secondary | ICD-10-CM | POA: Diagnosis not present

## 2022-01-15 LAB — LIPID PANEL
Chol/HDL Ratio: 3 ratio (ref 0.0–5.0)
Cholesterol, Total: 118 mg/dL (ref 100–199)
HDL: 40 mg/dL (ref >39)
LDL Chol Calc (NIH): 62 mg/dL (ref 0–99)
Triglycerides: 80 mg/dL (ref 0–149)
VLDL Cholesterol Cal: 16 mg/dL (ref 5–40)

## 2022-01-15 LAB — BASIC METABOLIC PANEL
BUN/Creatinine Ratio: 15 (ref 10–24)
BUN: 31 mg/dL — ABNORMAL HIGH (ref 8–27)
CO2: 21 mmol/L (ref 20–29)
Calcium: 9.6 mg/dL (ref 8.6–10.2)
Chloride: 107 mmol/L — ABNORMAL HIGH (ref 96–106)
Creatinine, Ser: 2.05 mg/dL — ABNORMAL HIGH (ref 0.76–1.27)
Glucose: 147 mg/dL — ABNORMAL HIGH (ref 70–99)
Potassium: 4.7 mmol/L (ref 3.5–5.2)
Sodium: 143 mmol/L (ref 134–144)
eGFR: 32 mL/min/{1.73_m2} — ABNORMAL LOW (ref >59)

## 2022-01-15 LAB — HEMOGLOBIN A1C
Est. average glucose Bld gHb Est-mCnc: 166 mg/dL
Hgb A1c MFr Bld: 7.4 % — ABNORMAL HIGH (ref 4.8–5.6)

## 2022-01-26 ENCOUNTER — Other Ambulatory Visit: Payer: Self-pay

## 2022-01-26 ENCOUNTER — Encounter: Payer: Self-pay | Admitting: Family Medicine

## 2022-01-26 ENCOUNTER — Ambulatory Visit (INDEPENDENT_AMBULATORY_CARE_PROVIDER_SITE_OTHER): Payer: Medicare HMO | Admitting: Family Medicine

## 2022-01-26 VITALS — BP 166/75 | HR 62 | Temp 97.7°F | Ht 71.0 in | Wt 151.0 lb

## 2022-01-26 DIAGNOSIS — N1832 Chronic kidney disease, stage 3b: Secondary | ICD-10-CM | POA: Diagnosis not present

## 2022-01-26 DIAGNOSIS — E1121 Type 2 diabetes mellitus with diabetic nephropathy: Secondary | ICD-10-CM | POA: Diagnosis not present

## 2022-01-26 DIAGNOSIS — E785 Hyperlipidemia, unspecified: Secondary | ICD-10-CM

## 2022-01-26 DIAGNOSIS — G5602 Carpal tunnel syndrome, left upper limb: Secondary | ICD-10-CM | POA: Diagnosis not present

## 2022-01-26 DIAGNOSIS — I10 Essential (primary) hypertension: Secondary | ICD-10-CM | POA: Diagnosis not present

## 2022-01-26 DIAGNOSIS — E1122 Type 2 diabetes mellitus with diabetic chronic kidney disease: Secondary | ICD-10-CM

## 2022-01-26 DIAGNOSIS — E1169 Type 2 diabetes mellitus with other specified complication: Secondary | ICD-10-CM | POA: Diagnosis not present

## 2022-01-26 MED ORDER — AMLODIPINE BESYLATE 10 MG PO TABS: 10.0000 mg | ORAL_TABLET | Freq: Every day | ORAL | 5 refills | Status: DC

## 2022-01-26 NOTE — Progress Notes (Signed)
? ?  Subjective:  ? ? Patient ID: Charles Medina, male    DOB: 03/06/1941, 81 y.o.   MRN: [id number] ? ?Hypertension ?This is a chronic problem. Treatments tried: amlodipine.  ?Hyperlipidemia ?This is a chronic problem. Treatments tried: atorvastatin.  ?Diabetes ?He has type 2 diabetes mellitus. Current diabetic treatments: sitagliptin.  ? ?Did discuss his medication ?Discussed healthy diet ?Discussed regular activity ?We also went over his labs including his elevated creatinine and low GFR ? ? ?Review of Systems ? ?   ?Objective:  ? Physical Exam ? ?General-in no acute distress ?Eyes-no discharge ?Lungs-respiratory rate normal, CTA ?CV-no murmurs,RRR ?Extremities skin warm dry no edema ?Neuro grossly normal ?Behavior normal, alert ? ? ? ?   ?Assessment & Plan:  ?1. Diabetic nephropathy associated with type 2 diabetes mellitus ([place]) ?Diabetes fair control.  Continue Januvia watch diet stay active ? ?2. Hyperlipidemia associated with type 2 diabetes mellitus ([place]) ?Cholesterol good control continue cholesterol medicine healthy diet ? ?3. Type 2 diabetes mellitus with stage 3b chronic kidney disease, without long-term current use of insulin ([place]) ?Creatinine is gone up GFR is gone down.  This has been gradually progressive over the past couple years.  Now at the point where consultation with nephrology necessary.  It is felt that his issue is reflection of diabetes hypertension and age ? ?4. Carpal tunnel syndrome of left wrist ?To wear a brace at nighttime ? ?Blood pressure mildly elevated bump up dose of amlodipine to 10 mg recommend follow-up office visit 4 weeks ? ? ?

## 2022-01-26 NOTE — Patient Instructions (Addendum)
Hi Charles Medina ? ?It was good seeing you today. ?Your diabetes and cholesterol are under good control.  Continue your Januvia for your diabetes and your a atorvastatin for your cholesterol.  These medications are doing well.  Continue a healthy diet ? ?Your blood pressure is elevated today-continue to eat healthy and stay active.  I recommend going up on your amlodipine.  Your new dose is 10 mg daily.  I request that you follow-up again in 4 weeks time so we can recheck your blood pressure. ?If you feel you are having any problems let me know. ? ?The intermittent numbness you are having in your hand is called carpal tunnel syndrome.  Currently the treatment would be wearing a brace at nighttime on your wrist to help prevent it from bending in your sleep.  When you follow-up in a month please let me know how you are doing. ? ?Finally your blood work does show decline in your kidney functions.  This is called chronic kidney disease and is due to combination of age, hypertension, and diabetes.  It would be wise to get the input of a nephrologist-which is a kidney doctor specialist-to help see if anything else needs to be done to help preserve your kidney function as best as possible.  Our office will send them the referral and they will call you to set up an appointment.  If you do not hear from them over the course of the next 2 weeks let me know. ? ?We will see you back in 4 weeks.  TakeCare-Dr. Nicki Reaper ? ?Carpal Tunnel Syndrome ?Carpal tunnel syndrome is a condition that causes pain, weakness, and numbness in your hand and arm. Numbness is when you cannot feel an area in your body. The carpal tunnel is a narrow area that is on the palm side of your wrist. Repeated wrist motion or certain diseases may cause swelling in the tunnel. This swelling can pinch the main nerve in the wrist. This nerve is called the median nerve. ?What are the causes? ?This condition may be caused by: ?Moving your hand and wrist over and over again  while doing a task. ?Injury to the wrist. ?Arthritis. ?A sac of fluid (cyst) or abnormal growth (tumor) in the carpal tunnel. ?Fluid buildup during pregnancy. ?Use of tools that vibrate. ?Sometimes the cause is not known. ?What increases the risk? ?The following factors may make you more likely to have this condition: ?Having a job that makes you do these things: ?Move your hand over and over again. ?Work with tools that vibrate, such as drills or sanders. ?Being a woman. ?Having diabetes, obesity, thyroid problems, or kidney failure. ?What are the signs or symptoms? ?Symptoms of this condition include: ?A tingling feeling in your fingers. ?Tingling or loss of feeling in your hand. ?Pain in your entire arm. This pain may get worse when you bend your wrist and elbow for a long time. ?Pain in your wrist that goes up your arm to your shoulder. ?Pain that goes down into your palm or fingers. ?Weakness in your hands. You may find it hard to grab and hold items. ?You may feel worse at night. ?How is this treated? ?This condition may be treated with: ?Lifestyle changes. You will be asked to stop or change the activity that caused your problem. ?Doing exercises and activities that make bones, muscles, and tendons stronger (physical therapy). ?Learning how to use your hand again (occupational therapy). ?Medicines for pain and swelling. You may have injections in your  wrist. ?A wrist splint or brace. ?Surgery. ?Follow these instructions at home: ?If you have a splint or brace: ?Wear the splint or brace as told by your doctor. Take it off only as told by your doctor. ?Loosen the splint if your fingers: ?Tingle. ?Become numb. ?Turn cold and blue. ?Keep the splint or brace clean. ?If the splint or brace is not waterproof: ?Do not let it get wet. ?Cover it with a watertight covering when you take a bath or a shower. ?Managing pain, stiffness, and swelling ?If told, put ice on the painful area: ?If you have a removable splint or  brace, remove it as told by your doctor. ?Put ice in a plastic bag. ?Place a towel between your skin and the bag. ?Leave the ice on for 20 minutes, 2-3 times per day. Do not fall asleep with the cold pack on your skin. ?Take off the ice if your skin turns bright red. This is very important. If you cannot feel pain, heat, or cold, you have a greater risk of damage to the area. ?Move your fingers often to reduce stiffness and swelling. ?General instructions ?Take over-the-counter and prescription medicines only as told by your doctor. ?Rest your wrist from any activity that may cause pain. If needed, talk with your boss at work about changes that can help your wrist heal. ?Do exercises as told by your doctor, physical therapist, or occupational therapist. ?Keep all follow-up visits. ?Contact a doctor if: ?You have new symptoms. ?Medicine does not help your pain. ?Your symptoms get worse. ?Get help right away if: ?You have very bad numbness or tingling in your wrist or hand. ?Summary ?Carpal tunnel syndrome is a condition that causes pain in your hand and arm. ?It is often caused by repeated wrist motions. ?Lifestyle changes and medicines are used to treat this problem. Surgery may help in very bad cases. ?Follow your doctor's instructions about wearing a splint, resting your wrist, keeping follow-up visits, and calling for help. ?This information is not intended to replace advice given to you by your health care provider. Make sure you discuss any questions you have with your health care provider. ?Document Revised: 03/05/2020 Document Reviewed: 03/05/2020 ?Elsevier Patient Education ? [place]. ? ?

## 2022-01-28 ENCOUNTER — Ambulatory Visit: Payer: Medicare HMO | Admitting: Family Medicine

## 2022-02-23 ENCOUNTER — Ambulatory Visit (INDEPENDENT_AMBULATORY_CARE_PROVIDER_SITE_OTHER): Payer: Medicare HMO | Admitting: Family Medicine

## 2022-02-23 VITALS — BP 140/64 | HR 61 | Temp 97.0°F | Ht 71.0 in | Wt 150.0 lb

## 2022-02-23 DIAGNOSIS — E1169 Type 2 diabetes mellitus with other specified complication: Secondary | ICD-10-CM | POA: Diagnosis not present

## 2022-02-23 DIAGNOSIS — N1832 Chronic kidney disease, stage 3b: Secondary | ICD-10-CM | POA: Diagnosis not present

## 2022-02-23 DIAGNOSIS — Z79899 Other long term (current) drug therapy: Secondary | ICD-10-CM | POA: Diagnosis not present

## 2022-02-23 DIAGNOSIS — E785 Hyperlipidemia, unspecified: Secondary | ICD-10-CM | POA: Diagnosis not present

## 2022-02-23 DIAGNOSIS — I714 Abdominal aortic aneurysm, without rupture, unspecified: Secondary | ICD-10-CM

## 2022-02-23 DIAGNOSIS — G5602 Carpal tunnel syndrome, left upper limb: Secondary | ICD-10-CM | POA: Diagnosis not present

## 2022-02-23 DIAGNOSIS — E1122 Type 2 diabetes mellitus with diabetic chronic kidney disease: Secondary | ICD-10-CM | POA: Diagnosis not present

## 2022-02-23 DIAGNOSIS — I10 Essential (primary) hypertension: Secondary | ICD-10-CM

## 2022-02-23 NOTE — Progress Notes (Signed)
? ?  Subjective:  ? ? Patient ID: Charles Medina, male    DOB: [birth year], 81 y.o.   MRN: [id number] ? ?HPI ? ?Patient here for 4 week follow up. ?Here for follow-up regarding blood pressure ?We did bump up the dose of amlodipine to 10 mg ?We also referred him to nephrology ?Patient does relate intermittent numbness and tingling in the left hand but denies any weakness into the hand.  This been going on for well over a month and happens every single day he does wear a brace at nighttime ?Carpal tunnel syndrome of left wrist ? ?Primary hypertension ? ?Review of Systems ? ?   ?Objective:  ? Physical Exam ? ?General-in no acute distress ?Eyes-no discharge ?Lungs-respiratory rate normal, CTA ?CV-no murmurs,RRR ?Extremities skin warm dry no edema ?Neuro grossly normal ?Behavior normal, alert ? ? ? ?   ?Assessment & Plan:  ?1. Carpal tunnel syndrome of left wrist ?Referral for nerve conduction study.  If abnormal referral to orthopedics-patient made mention of a doctor who comes to [facility] from [place] if we utilize orthopedics we will look into that ?- Ambulatory referral to Neurology ? ?2. Primary hypertension ?Blood pressure decent control.  Continue current measures.  With the diastolic in the 93A I have chosen not to increase his medications currently ? ?3. Type 2 diabetes mellitus with stage 3b chronic kidney disease, without long-term current use of insulin ([place]) ?Under decent control but concerning for kidney disease consultation with nephrology ? ?4. Hyperlipidemia associated with type 2 diabetes mellitus ([place]) ?Continue cholesterol medicine watch diet ? ?5. High risk medication use ?Tolerating medicines well ?Follow-up by September ?Patient does have a aortic aneurysm has a graft not having any symptoms currently no abdominal pain ?

## 2022-02-24 ENCOUNTER — Other Ambulatory Visit (HOSPITAL_COMMUNITY): Payer: Self-pay | Admitting: Nephrology

## 2022-02-24 ENCOUNTER — Other Ambulatory Visit: Payer: Self-pay | Admitting: Nephrology

## 2022-02-24 DIAGNOSIS — R809 Proteinuria, unspecified: Secondary | ICD-10-CM | POA: Diagnosis not present

## 2022-02-24 DIAGNOSIS — I129 Hypertensive chronic kidney disease with stage 1 through stage 4 chronic kidney disease, or unspecified chronic kidney disease: Secondary | ICD-10-CM | POA: Diagnosis not present

## 2022-02-24 DIAGNOSIS — E1122 Type 2 diabetes mellitus with diabetic chronic kidney disease: Secondary | ICD-10-CM

## 2022-02-24 DIAGNOSIS — D696 Thrombocytopenia, unspecified: Secondary | ICD-10-CM | POA: Diagnosis not present

## 2022-02-24 DIAGNOSIS — D638 Anemia in other chronic diseases classified elsewhere: Secondary | ICD-10-CM

## 2022-02-24 DIAGNOSIS — E1129 Type 2 diabetes mellitus with other diabetic kidney complication: Secondary | ICD-10-CM | POA: Diagnosis not present

## 2022-02-24 DIAGNOSIS — N189 Chronic kidney disease, unspecified: Secondary | ICD-10-CM | POA: Diagnosis not present

## 2022-02-24 DIAGNOSIS — I714 Abdominal aortic aneurysm, without rupture, unspecified: Secondary | ICD-10-CM | POA: Diagnosis not present

## 2022-03-03 ENCOUNTER — Telehealth: Payer: Self-pay | Admitting: Family Medicine

## 2022-03-03 DIAGNOSIS — M79643 Pain in unspecified hand: Secondary | ICD-10-CM

## 2022-03-03 NOTE — Telephone Encounter (Signed)
May have referral 

## 2022-03-03 NOTE — Telephone Encounter (Signed)
Patient is having a lot of hand pain and would like a referral to Dr. Joni Fears in [place]. This is his number [phone] ?

## 2022-03-04 ENCOUNTER — Ambulatory Visit (HOSPITAL_COMMUNITY)
Admission: RE | Admit: 2022-03-04 | Discharge: 2022-03-04 | Disposition: A | Discharge status: Home / Self Care | Payer: Medicare HMO | Source: Ambulatory Visit | Attending: Nephrology | Admitting: Nephrology

## 2022-03-04 DIAGNOSIS — E559 Vitamin D deficiency, unspecified: Secondary | ICD-10-CM | POA: Diagnosis not present

## 2022-03-04 DIAGNOSIS — R809 Proteinuria, unspecified: Secondary | ICD-10-CM

## 2022-03-04 DIAGNOSIS — E1129 Type 2 diabetes mellitus with other diabetic kidney complication: Secondary | ICD-10-CM | POA: Diagnosis not present

## 2022-03-04 DIAGNOSIS — N3289 Other specified disorders of bladder: Secondary | ICD-10-CM | POA: Diagnosis not present

## 2022-03-04 DIAGNOSIS — E1122 Type 2 diabetes mellitus with diabetic chronic kidney disease: Secondary | ICD-10-CM

## 2022-03-04 DIAGNOSIS — I714 Abdominal aortic aneurysm, without rupture, unspecified: Secondary | ICD-10-CM | POA: Diagnosis not present

## 2022-03-04 DIAGNOSIS — D696 Thrombocytopenia, unspecified: Secondary | ICD-10-CM | POA: Diagnosis not present

## 2022-03-04 DIAGNOSIS — I129 Hypertensive chronic kidney disease with stage 1 through stage 4 chronic kidney disease, or unspecified chronic kidney disease: Secondary | ICD-10-CM | POA: Diagnosis not present

## 2022-03-04 DIAGNOSIS — D638 Anemia in other chronic diseases classified elsewhere: Secondary | ICD-10-CM | POA: Insufficient documentation

## 2022-03-04 DIAGNOSIS — N189 Chronic kidney disease, unspecified: Secondary | ICD-10-CM | POA: Diagnosis not present

## 2022-03-04 NOTE — Telephone Encounter (Signed)
Referral placed and pt is aware. 

## 2022-03-09 ENCOUNTER — Encounter: Payer: Self-pay | Admitting: Orthopaedic Surgery

## 2022-03-09 ENCOUNTER — Ambulatory Visit: Payer: Medicare HMO | Admitting: Orthopaedic Surgery

## 2022-03-09 VITALS — Ht 71.0 in | Wt 160.0 lb

## 2022-03-09 DIAGNOSIS — G5602 Carpal tunnel syndrome, left upper limb: Secondary | ICD-10-CM | POA: Diagnosis not present

## 2022-03-09 NOTE — Progress Notes (Addendum)
? ?Office Visit Note ?  ?Patient: Charles Medina           ?Date of Birth: May 15, 1941           ?MRN: [id number] ?Visit Date: 03/09/2022 ?             ?Requested by: Kathyrn Drown, MD ?[address] ?[address] ?[place],  [place] [address] ?PCP: Kathyrn Drown, MD ? ? ?Assessment & Plan: ?Visit Diagnoses: left hand numbness ? ?Plan: Charles Medina is a pleasant 81 year old gentleman with a 36-monthhistory of numbing and tingling of his left hand that is most prominent in the thumb index finger and the medial side of the middle finger.  He is right-handed.  He is also a diabetic.  He denies any injuries.  This awakens him at night and his hand goes numb when he is driving and other activities.  He admits he uses his hands a lot for work.  He has tried a brace but it is not improved his symptoms findings that are consistent with carpal tunnel syndrome.  We explained the natural history of this.  We would like to go forward with electrodiagnostic studies to define this better.  Explained to him that this was not improved he could go forward with a carpal tunnel release we will follow-up with him once the study is done ? ?Follow-Up Instructions: No follow-ups on file.  ? ?Orders:  ?No orders of the defined types were placed in this encounter. ? ?No orders of the defined types were placed in this encounter. ? ? ? ? Procedures: ?No procedures performed ? ? ?Clinical Data: ?No additional findings. ? ? ?Subjective: ?Chief Complaint  ?Patient presents with  ? Left Hand - Pain, Numbness  ?Patient presents today for left hand pain. He said that it started about 2-381monthago. No known injury. He has numbness in the tips of his fingers. He has also been experiencing pain. His PCP told him to use a brace, but that does not help. He is not taking anything for pain.  ? ?HPI ? ?Review of Systems  ?All other systems reviewed and are negative. ? ? ?Objective: ?Vital Signs: There were no vitals taken for this visit. ? ?Physical Exam ?Pulmonary:  ?    Effort: Pulmonary effort is normal.  ?Skin: ?   General: Skin is warm and dry.  ?Psychiatric:     ?   Mood and Affect: Mood normal.     ?   Behavior: Behavior normal.  ? ? ?Ortho Exam ?Examination of his left hand he has palpable pulses and his hand is warm.  He does have muscle atrophy over the thenar eminence.  Especially when compared to the unaffected side.  He can oppose his thumb to pinky but is weaker than the right side.  He has a strongly positive Tinel's sign.  He has altered sensation of the thumb index finger and medial side of the long finger. ?Specialty Comments:  ?No specialty comments available. ? ?Imaging: ?No results found. ? ? ?PMFS History: ?Patient Active Problem List  ? Diagnosis Date Noted  ? Hyperlipidemia associated with type 2 diabetes mellitus (HCMcLean09/13/2022  ? Thrombocytopenia (HCWauk[facility]11/16/2021  ? Diabetic nephropathy associated with type 2 diabetes mellitus (HCPrairie Creek06/24/2020  ? AAA (abdominal aortic aneurysm) (HCTremonton08/18/2016  ? AAA (abdominal aortic aneurysm) without rupture (HCPlentywood08/03/2015  ? Prostate cancer (HCP[place]07/27/2016  ? Type 2 diabetes mellitus with stage 3b chronic kidney disease, without long-term current  use of insulin ([place]) 03/19/2015  ? History of prostate cancer 03/19/2015  ? Hallux valgus with bunions 03/19/2015  ? Type II or unspecified type diabetes mellitus without mention of complication, uncontrolled 01/28/2013  ? Hypertension 01/28/2013  ? ?Past Medical History:  ?Diagnosis Date  ? AAA (abdominal aortic aneurysm)   ? Arthritis   ? Diabetes mellitus without complication ([place])   ? Hypertension   ? Prostate cancer ([place])   ? Type 2 diabetes mellitus ([place])   ?  ?Family History  ?Family history unknown: Yes  ?  ?Past Surgical History:  ?Procedure Laterality Date  ? ABDOMINAL AORTIC ENDOVASCULAR STENT GRAFT N/A 06/25/2015  ? Procedure: ABDOMINAL AORTIC ENDOVASCULAR STENT GRAFT;  Surgeon: Conrad [place], MD;  Location: [facility];  Service: Vascular;  Laterality: N/A;  ? BIOPSY   12/20/2019  ? Procedure: BIOPSY;  Surgeon: Rogene Houston, MD;  [facility];  Service: Endoscopy;;  esophagus  ? COLONOSCOPY  11/14/2012  ? Procedure: COLONOSCOPY;  Surgeon: Rogene Houston, MD;  [facility];  Service: Endoscopy;  Laterality: N/A;  830  ? COLONOSCOPY WITH PROPOFOL N/A 12/20/2019  ? Procedure: COLONOSCOPY WITH PROPOFOL;  Surgeon: Rogene Houston, MD;  [facility];  Service: Endoscopy;  Laterality: N/A;  940  ? ESOPHAGOGASTRODUODENOSCOPY (EGD) WITH PROPOFOL N/A 12/20/2019  ? Procedure: ESOPHAGOGASTRODUODENOSCOPY (EGD) WITH PROPOFOL;  Surgeon: Rogene Houston, MD;  [facility];  Service: Endoscopy;  Laterality: N/A;  ? POLYPECTOMY  12/20/2019  ? Procedure: POLYPECTOMY;  Surgeon: Rogene Houston, MD;  [facility];  Service: Endoscopy;;  colon  ? PROSTATECTOMY  2011  ? ?Social History  ? ?Occupational History  ? Not on file  ?Tobacco Use  ? Smoking status: Former  ?  Packs/day: 0.50  ?  Years: 23.00  ?  Pack years: 11.50  ?  Types: Cigarettes  ?  Start date: 05/08/1956  ?  Quit date: 11/15/1971  ?  Years since quitting: 50.3  ? Smokeless tobacco: Never  ?Substance and Sexual Activity  ? Alcohol use: No  ?  Alcohol/week: 0.0 standard drinks  ? Drug use: No  ? Sexual activity: Yes  ? ? ? ? ? ? ?

## 2022-03-09 NOTE — Addendum Note (Signed)
Addended by: Lendon Collar on: 03/09/2022 10:25 AM ? ? Modules accepted: Orders ? ?

## 2022-03-22 DIAGNOSIS — G5602 Carpal tunnel syndrome, left upper limb: Secondary | ICD-10-CM | POA: Diagnosis not present

## 2022-03-24 DIAGNOSIS — R809 Proteinuria, unspecified: Secondary | ICD-10-CM | POA: Diagnosis not present

## 2022-03-24 DIAGNOSIS — N189 Chronic kidney disease, unspecified: Secondary | ICD-10-CM | POA: Diagnosis not present

## 2022-03-24 DIAGNOSIS — D638 Anemia in other chronic diseases classified elsewhere: Secondary | ICD-10-CM | POA: Diagnosis not present

## 2022-03-24 DIAGNOSIS — R768 Other specified abnormal immunological findings in serum: Secondary | ICD-10-CM | POA: Diagnosis not present

## 2022-03-24 DIAGNOSIS — E1122 Type 2 diabetes mellitus with diabetic chronic kidney disease: Secondary | ICD-10-CM | POA: Diagnosis not present

## 2022-03-24 DIAGNOSIS — I129 Hypertensive chronic kidney disease with stage 1 through stage 4 chronic kidney disease, or unspecified chronic kidney disease: Secondary | ICD-10-CM | POA: Diagnosis not present

## 2022-03-24 DIAGNOSIS — E1129 Type 2 diabetes mellitus with other diabetic kidney complication: Secondary | ICD-10-CM | POA: Diagnosis not present

## 2022-03-24 DIAGNOSIS — R778 Other specified abnormalities of plasma proteins: Secondary | ICD-10-CM | POA: Diagnosis not present

## 2022-04-12 DIAGNOSIS — E119 Type 2 diabetes mellitus without complications: Secondary | ICD-10-CM | POA: Diagnosis not present

## 2022-04-14 ENCOUNTER — Telehealth: Payer: Self-pay | Admitting: Orthopaedic Surgery

## 2022-04-14 NOTE — Telephone Encounter (Signed)
Pt called wanting to speak with someone about his carpal tunnel.   CB [phone]

## 2022-04-15 NOTE — Telephone Encounter (Signed)
Called and spoke with patient. He did not need anything. He is scheduled for upcoming surgery and had no questions.

## 2022-04-21 ENCOUNTER — Other Ambulatory Visit: Payer: Self-pay | Admitting: Orthopaedic Surgery

## 2022-04-21 DIAGNOSIS — G5602 Carpal tunnel syndrome, left upper limb: Secondary | ICD-10-CM | POA: Diagnosis not present

## 2022-04-21 MED ORDER — HYDROCODONE-ACETAMINOPHEN 5-325 MG PO TABS: 1.0000 | ORAL_TABLET | Freq: Four times a day (QID) | ORAL | 0 refills | Status: DC | PRN

## 2022-04-28 ENCOUNTER — Encounter: Payer: Self-pay | Admitting: Orthopaedic Surgery

## 2022-04-28 ENCOUNTER — Ambulatory Visit (INDEPENDENT_AMBULATORY_CARE_PROVIDER_SITE_OTHER): Payer: Medicare HMO | Admitting: Orthopaedic Surgery

## 2022-04-28 DIAGNOSIS — G5602 Carpal tunnel syndrome, left upper limb: Secondary | ICD-10-CM

## 2022-05-04 ENCOUNTER — Telehealth: Payer: Self-pay | Admitting: Orthopaedic Surgery

## 2022-05-04 NOTE — Telephone Encounter (Signed)
Pt would like refill on pain medicine.

## 2022-05-04 NOTE — Telephone Encounter (Signed)
Looks like it's a [facility] patient

## 2022-05-05 ENCOUNTER — Other Ambulatory Visit: Payer: Self-pay | Admitting: Physician Assistant

## 2022-05-05 ENCOUNTER — Telehealth: Payer: Self-pay | Admitting: Orthopaedic Surgery

## 2022-05-05 MED ORDER — TRAMADOL HCL 50 MG PO TABS: 50.0000 mg | ORAL_TABLET | Freq: Four times a day (QID) | ORAL | 0 refills | Status: AC | PRN

## 2022-05-05 NOTE — Telephone Encounter (Signed)
Bevely Palmer sent in a new prescription

## 2022-05-05 NOTE — Telephone Encounter (Signed)
Patient called in requesting refill of Hydrocodone please advise

## 2022-05-11 ENCOUNTER — Encounter: Payer: Self-pay | Admitting: Orthopaedic Surgery

## 2022-05-11 ENCOUNTER — Ambulatory Visit (INDEPENDENT_AMBULATORY_CARE_PROVIDER_SITE_OTHER): Payer: Medicare HMO | Admitting: Orthopaedic Surgery

## 2022-05-11 DIAGNOSIS — G5602 Carpal tunnel syndrome, left upper limb: Secondary | ICD-10-CM

## 2022-05-11 DIAGNOSIS — G5601 Carpal tunnel syndrome, right upper limb: Secondary | ICD-10-CM | POA: Insufficient documentation

## 2022-05-11 NOTE — Progress Notes (Signed)
Office Visit Note   Patient: Charles Medina           Date of Birth: [birth year]           MRN: [id number] Visit Date: 05/11/2022              Requested by: Kathyrn Drown, MD [address],  [place] [address] PCP: Kathyrn Drown, MD  No chief complaint on file.     HPI: Patient is a pleasant 81 year old gentleman who is 3 weeks status post left carpal tunnel release he is doing well.  Denies fever or chills.  Still has some numbness in his digits but he is not having problems at night and is able to sleep.  Not taking anything for pain.  Feels like there is a difference from his preoperative pain that he is having less pain and numbness.  "I feel like my feeling is coming back".  Assessment & Plan: Visit Diagnoses: No diagnosis found.  Plan: 3 weeks status post left carpal tunnel release doing quite well.  Sutures removed without difficulty he has a well-healed incision there is no erythema no swelling.  He should continue to use his splint for another week.  Follow-up in 1 month.  Follow-Up Instructions: Return in about 1 month (around 06/11/2022).   Ortho Exam  Patient is alert, oriented, no adenopathy, well-dressed, normal affect, normal respiratory effort. Examination of his left wrist well-healed surgical incision he has some slight dehiscence of the very topical skin layer but no evidence of deep dehiscence.  There is no drainage no redness he has good capillary refill  Imaging: No results found. No images are attached to the encounter.  Labs: Lab Results  Component Value Date   HGBA1C 7.4 (H) 01/14/2022   HGBA1C 8.1 (H) 10/15/2021   HGBA1C 8.3 (H) 06/23/2021     Lab Results  Component Value Date   ALBUMIN 4.3 06/23/2021   ALBUMIN 4.6 02/25/2020   ALBUMIN 4.4 08/21/2019    Lab Results  Component Value Date   MG 2.0 01/27/2021   MG 1.5 (L) 06/25/2015   No results found for: "VD25OH"  No results found for: "PREALBUMIN"    Latest Ref Rng &  Units 10/15/2021    9:22 AM 08/21/2019    9:14 AM 09/19/2018    8:54 AM  CBC EXTENDED  WBC 3.4 - 10.8 x10E3/uL 5.1  6.6  6.4   RBC 4.14 - 5.80 x10E6/uL 4.12  4.19  4.34   Hemoglobin 13.0 - 17.7 g/dL 12.8  12.7  13.2   HCT 37.5 - 51.0 % 37.4  39.1  40.1   Platelets 150 - 450 x10E3/uL 139  139  151   NEUT# 1.4 - 7.0 x10E3/uL 3.2  4.8  4.7   Lymph# 0.7 - 3.1 x10E3/uL 1.2  1.1  1.0      There is no height or weight on file to calculate BMI.  Orders:  No orders of the defined types were placed in this encounter.  No orders of the defined types were placed in this encounter.    Procedures: No procedures performed  Clinical Data: No additional findings.  ROS:  All other systems negative, except as noted in the HPI. Review of Systems  Objective: Vital Signs: There were no vitals taken for this visit.  Specialty Comments:  No specialty comments available.  PMFS History: Patient Active Problem List   Diagnosis Date Noted   Carpal tunnel syndrome, left upper  limb 03/09/2022   Hyperlipidemia associated with type 2 diabetes mellitus ([place]) 07/20/2021   Thrombocytopenia ([place]) 09/22/2020   Diabetic nephropathy associated with type 2 diabetes mellitus ([place]) 05/01/2019   AAA (abdominal aortic aneurysm) ([place]) 06/25/2015   AAA (abdominal aortic aneurysm) without rupture ([place]) 06/12/2015   Prostate cancer ([place]) 06/03/2015   Type 2 diabetes mellitus with stage 3b chronic kidney disease, without long-term current use of insulin ([place]) 03/19/2015   History of prostate cancer 03/19/2015   Hallux valgus with bunions 03/19/2015   Type II or unspecified type diabetes mellitus without mention of complication, uncontrolled 01/28/2013   Hypertension 01/28/2013   Past Medical History:  Diagnosis Date   AAA (abdominal aortic aneurysm) (HCC)    Arthritis    Diabetes mellitus without complication (HCC)    Hypertension    Prostate cancer ([place])    Type 2 diabetes mellitus ([place])     Family  History  Family history unknown: Yes    Past Surgical History:  Procedure Laterality Date   ABDOMINAL AORTIC ENDOVASCULAR STENT GRAFT N/A 06/25/2015   Procedure: ABDOMINAL AORTIC ENDOVASCULAR STENT GRAFT;  Surgeon: Conrad [place], MD;  Location: [facility];  Service: Vascular;  Laterality: N/A;   BIOPSY  12/20/2019   Procedure: BIOPSY;  Surgeon: Rogene Houston, MD;  [facility];  Service: Endoscopy;;  esophagus   COLONOSCOPY  11/14/2012   Procedure: COLONOSCOPY;  Surgeon: Rogene Houston, MD;  [facility];  Service: Endoscopy;  Laterality: N/A;  830   COLONOSCOPY WITH PROPOFOL N/A 12/20/2019   Procedure: COLONOSCOPY WITH PROPOFOL;  Surgeon: Rogene Houston, MD;  [facility];  Service: Endoscopy;  Laterality: N/A;  940   ESOPHAGOGASTRODUODENOSCOPY (EGD) WITH PROPOFOL N/A 12/20/2019   Procedure: ESOPHAGOGASTRODUODENOSCOPY (EGD) WITH PROPOFOL;  Surgeon: Rogene Houston, MD;  [facility];  Service: Endoscopy;  Laterality: N/A;   POLYPECTOMY  12/20/2019   Procedure: POLYPECTOMY;  Surgeon: Rogene Houston, MD;  [facility];  Service: Endoscopy;;  colon   PROSTATECTOMY  2011   Social History   Occupational History   Not on file  Tobacco Use   Smoking status: Former    Packs/day: 0.50    Years: 23.00    Total pack years: 11.50    Types: Cigarettes    Start date: 05/08/1956    Quit date: 11/15/1971    Years since quitting: 50.5   Smokeless tobacco: Never  Substance and Sexual Activity   Alcohol use: No    Alcohol/week: 0.0 standard drinks of alcohol   Drug use: No   Sexual activity: Yes

## 2022-05-17 ENCOUNTER — Other Ambulatory Visit: Payer: Self-pay | Admitting: Family Medicine

## 2022-06-09 ENCOUNTER — Other Ambulatory Visit (HOSPITAL_COMMUNITY)
Admission: RE | Admit: 2022-06-09 | Discharge: 2022-06-09 | Disposition: A | Discharge status: Home / Self Care | Payer: Medicare HMO | Source: Ambulatory Visit | Attending: Nephrology | Admitting: Nephrology

## 2022-06-09 DIAGNOSIS — N170 Acute kidney failure with tubular necrosis: Secondary | ICD-10-CM | POA: Diagnosis not present

## 2022-06-09 DIAGNOSIS — E875 Hyperkalemia: Secondary | ICD-10-CM | POA: Diagnosis not present

## 2022-06-09 DIAGNOSIS — N2581 Secondary hyperparathyroidism of renal origin: Secondary | ICD-10-CM | POA: Diagnosis not present

## 2022-06-09 DIAGNOSIS — E1129 Type 2 diabetes mellitus with other diabetic kidney complication: Secondary | ICD-10-CM | POA: Diagnosis not present

## 2022-06-09 DIAGNOSIS — N189 Chronic kidney disease, unspecified: Secondary | ICD-10-CM | POA: Diagnosis not present

## 2022-06-09 DIAGNOSIS — R809 Proteinuria, unspecified: Secondary | ICD-10-CM | POA: Diagnosis not present

## 2022-06-09 DIAGNOSIS — Z8679 Personal history of other diseases of the circulatory system: Secondary | ICD-10-CM | POA: Diagnosis not present

## 2022-06-09 DIAGNOSIS — E1122 Type 2 diabetes mellitus with diabetic chronic kidney disease: Secondary | ICD-10-CM | POA: Diagnosis not present

## 2022-06-09 DIAGNOSIS — I952 Hypotension due to drugs: Secondary | ICD-10-CM | POA: Diagnosis not present

## 2022-06-09 DIAGNOSIS — D638 Anemia in other chronic diseases classified elsewhere: Secondary | ICD-10-CM | POA: Diagnosis not present

## 2022-06-09 LAB — RENAL FUNCTION PANEL
Albumin: 4.2 g/dL (ref 3.5–5.0)
Anion gap: 7 (ref 5–15)
BUN: 35 mg/dL — ABNORMAL HIGH (ref 8–23)
CO2: 24 mmol/L (ref 22–32)
Calcium: 9.4 mg/dL (ref 8.9–10.3)
Chloride: 107 mmol/L (ref 98–111)
Creatinine, Ser: 2.5 mg/dL — ABNORMAL HIGH (ref 0.61–1.24)
GFR, Estimated: 25 mL/min — ABNORMAL LOW (ref >60)
Glucose, Bld: 185 mg/dL — ABNORMAL HIGH (ref 70–99)
Phosphorus: 3.3 mg/dL (ref 2.5–4.6)
Potassium: 5.4 mmol/L — ABNORMAL HIGH (ref 3.5–5.1)
Sodium: 138 mmol/L (ref 135–145)

## 2022-06-09 LAB — CBC
HCT: 36.3 % — ABNORMAL LOW (ref 39.0–52.0)
Hemoglobin: 11.8 g/dL — ABNORMAL LOW (ref 13.0–17.0)
MCH: 31.6 pg (ref 26.0–34.0)
MCHC: 32.5 g/dL (ref 30.0–36.0)
MCV: 97.3 fL (ref 80.0–100.0)
Platelets: 133 10*3/uL — ABNORMAL LOW (ref 150–400)
RBC: 3.73 MIL/uL — ABNORMAL LOW (ref 4.22–5.81)
RDW: 13.5 % (ref 11.5–15.5)
WBC: 5.7 10*3/uL (ref 4.0–10.5)
nRBC: 0 % (ref 0.0–0.2)

## 2022-06-09 LAB — PROTEIN / CREATININE RATIO, URINE
Creatinine, Urine: 288.55 mg/dL
Protein Creatinine Ratio: 0.05 mg/mg{Cre} (ref 0.00–0.15)
Total Protein, Urine: 15 mg/dL

## 2022-06-13 ENCOUNTER — Ambulatory Visit (INDEPENDENT_AMBULATORY_CARE_PROVIDER_SITE_OTHER): Payer: Medicare HMO | Admitting: Physician Assistant

## 2022-06-13 ENCOUNTER — Encounter: Payer: Self-pay | Admitting: Physician Assistant

## 2022-06-13 DIAGNOSIS — G5602 Carpal tunnel syndrome, left upper limb: Secondary | ICD-10-CM

## 2022-06-13 NOTE — Progress Notes (Signed)
Office Visit Note   Patient: Charles Medina           Date of Birth: 21-Jan-1941           MRN: [id number] Visit Date: 06/13/2022              Requested by: Kathyrn Drown, MD [place],  [place] [id number] PCP: Kathyrn Drown, MD  Postoperative visit left carpal tunnel release    HPI: Mr. Charles Medina is a pleasant 81 year old right-hand-dominant gentleman, who is 7 weeks status post left carpal tunnel release.  He feels recovered and pleased with the results.  He says he still just has a little bit of numbing but it is improving.  Assessment & Plan: Visit Diagnoses:  1. Carpal tunnel syndrome, left upper limb     Plan: 7 weeks status post carpal tunnel release with Dr. Durward Fortes.  Patient is doing very well.  Still has just a tiny bit of numbing in his fingers but is not functionally a bother to him.  He is got good strength and good opposition of his fingers he has no swelling and a well-healed surgical incision.  He may follow-up as needed  Follow-Up Instructions:   Ortho Exam  Patient is alert, oriented, no adenopathy, well-dressed, normal affect, normal respiratory effort. Examination of his left wrist well-healed surgical incision.  No redness no swelling no cellulitis.  He can oppose all his fingers without difficulty.  Grip strength is intact and painless he has brisk capillary refill in his fingers which are warm and pink  Imaging: No results found. No images are attached to the encounter.  Labs: Lab Results  Component Value Date   HGBA1C 7.4 (H) 01/14/2022   HGBA1C 8.1 (H) 10/15/2021   HGBA1C 8.3 (H) 06/23/2021     Lab Results  Component Value Date   ALBUMIN 4.2 06/09/2022   ALBUMIN 4.3 06/23/2021   ALBUMIN 4.6 02/25/2020    Lab Results  Component Value Date   MG 2.0 01/27/2021   MG 1.5 (L) 06/25/2015   No results found for: "VD25OH"  No results found for: "PREALBUMIN"    Latest Ref Rng & Units 06/09/2022    9:16 AM 10/15/2021    9:22  AM 08/21/2019    9:14 AM  CBC EXTENDED  WBC 4.0 - 10.5 K/uL 5.7  5.1  6.6   RBC 4.22 - 5.81 MIL/uL 3.73  4.12  4.19   Hemoglobin 13.0 - 17.0 g/dL 11.8  12.8  12.7   HCT 39.0 - 52.0 % 36.3  37.4  39.1   Platelets 150 - 400 K/uL 133  139  139   NEUT# 1.4 - 7.0 x10E3/uL  3.2  4.8   Lymph# 0.7 - 3.1 x10E3/uL  1.2  1.1      There is no height or weight on file to calculate BMI.  Orders:  No orders of the defined types were placed in this encounter.  No orders of the defined types were placed in this encounter.    Procedures: No procedures performed  Clinical Data: No additional findings.  ROS:  All other systems negative, except as noted in the HPI. Review of Systems  Objective: Vital Signs: There were no vitals taken for this visit.  Specialty Comments:  No specialty comments available.  PMFS History: Patient Active Problem List   Diagnosis Date Noted   Carpal tunnel syndrome, right upper limb 05/11/2022   Carpal tunnel syndrome, left upper limb 03/09/2022  Hyperlipidemia associated with type 2 diabetes mellitus ([place]) 07/20/2021   Thrombocytopenia ([place]) 09/22/2020   Diabetic nephropathy associated with type 2 diabetes mellitus ([place]) 05/01/2019   AAA (abdominal aortic aneurysm) ([place]) 06/25/2015   AAA (abdominal aortic aneurysm) without rupture ([place]) 06/12/2015   Prostate cancer ([place]) 06/03/2015   Type 2 diabetes mellitus with stage 3b chronic kidney disease, without long-term current use of insulin ([place]) 03/19/2015   History of prostate cancer 03/19/2015   Hallux valgus with bunions 03/19/2015   Type II or unspecified type diabetes mellitus without mention of complication, uncontrolled 01/28/2013   Hypertension 01/28/2013   Past Medical History:  Diagnosis Date   AAA (abdominal aortic aneurysm) (HCC)    Arthritis    Diabetes mellitus without complication (HCC)    Hypertension    Prostate cancer ([place])    Type 2 diabetes mellitus ([place])     Family History   Family history unknown: Yes    Past Surgical History:  Procedure Laterality Date   ABDOMINAL AORTIC ENDOVASCULAR STENT GRAFT N/A 06/25/2015   Procedure: ABDOMINAL AORTIC ENDOVASCULAR STENT GRAFT;  Surgeon: Conrad Alta Sierra, MD;  Location: [facility];  Service: Vascular;  Laterality: N/A;   BIOPSY  12/20/2019   Procedure: BIOPSY;  Surgeon: Rogene Houston, MD;  [facility];  Service: Endoscopy;;  esophagus   COLONOSCOPY  11/14/2012   Procedure: COLONOSCOPY;  Surgeon: Rogene Houston, MD;  [facility];  Service: Endoscopy;  Laterality: N/A;  830   COLONOSCOPY WITH PROPOFOL N/A 12/20/2019   Procedure: COLONOSCOPY WITH PROPOFOL;  Surgeon: Rogene Houston, MD;  [facility];  Service: Endoscopy;  Laterality: N/A;  940   ESOPHAGOGASTRODUODENOSCOPY (EGD) WITH PROPOFOL N/A 12/20/2019   Procedure: ESOPHAGOGASTRODUODENOSCOPY (EGD) WITH PROPOFOL;  Surgeon: Rogene Houston, MD;  [facility];  Service: Endoscopy;  Laterality: N/A;   POLYPECTOMY  12/20/2019   Procedure: POLYPECTOMY;  Surgeon: Rogene Houston, MD;  [facility];  Service: Endoscopy;;  colon   PROSTATECTOMY  2011   Social History   Occupational History   Not on file  Tobacco Use   Smoking status: Former    Packs/day: 0.50    Years: 23.00    Total pack years: 11.50    Types: Cigarettes    Start date: 05/08/1956    Quit date: 11/15/1971    Years since quitting: 50.6   Smokeless tobacco: Never  Substance and Sexual Activity   Alcohol use: No    Alcohol/week: 0.0 standard drinks of alcohol   Drug use: No   Sexual activity: Yes

## 2022-07-05 DIAGNOSIS — R809 Proteinuria, unspecified: Secondary | ICD-10-CM | POA: Diagnosis not present

## 2022-07-05 DIAGNOSIS — I952 Hypotension due to drugs: Secondary | ICD-10-CM | POA: Diagnosis not present

## 2022-07-05 DIAGNOSIS — N189 Chronic kidney disease, unspecified: Secondary | ICD-10-CM | POA: Diagnosis not present

## 2022-07-05 DIAGNOSIS — E1129 Type 2 diabetes mellitus with other diabetic kidney complication: Secondary | ICD-10-CM | POA: Diagnosis not present

## 2022-07-05 DIAGNOSIS — E875 Hyperkalemia: Secondary | ICD-10-CM | POA: Diagnosis not present

## 2022-07-05 DIAGNOSIS — Z8679 Personal history of other diseases of the circulatory system: Secondary | ICD-10-CM | POA: Diagnosis not present

## 2022-07-05 DIAGNOSIS — N170 Acute kidney failure with tubular necrosis: Secondary | ICD-10-CM | POA: Diagnosis not present

## 2022-07-05 DIAGNOSIS — E1122 Type 2 diabetes mellitus with diabetic chronic kidney disease: Secondary | ICD-10-CM | POA: Diagnosis not present

## 2022-07-05 DIAGNOSIS — D638 Anemia in other chronic diseases classified elsewhere: Secondary | ICD-10-CM | POA: Diagnosis not present

## 2022-07-07 DIAGNOSIS — N189 Chronic kidney disease, unspecified: Secondary | ICD-10-CM | POA: Diagnosis not present

## 2022-07-07 DIAGNOSIS — E1129 Type 2 diabetes mellitus with other diabetic kidney complication: Secondary | ICD-10-CM | POA: Diagnosis not present

## 2022-07-07 DIAGNOSIS — E875 Hyperkalemia: Secondary | ICD-10-CM | POA: Diagnosis not present

## 2022-07-07 DIAGNOSIS — E1122 Type 2 diabetes mellitus with diabetic chronic kidney disease: Secondary | ICD-10-CM | POA: Diagnosis not present

## 2022-07-07 DIAGNOSIS — R809 Proteinuria, unspecified: Secondary | ICD-10-CM | POA: Diagnosis not present

## 2022-07-07 DIAGNOSIS — D638 Anemia in other chronic diseases classified elsewhere: Secondary | ICD-10-CM | POA: Diagnosis not present

## 2022-07-07 DIAGNOSIS — N170 Acute kidney failure with tubular necrosis: Secondary | ICD-10-CM | POA: Diagnosis not present

## 2022-07-19 DIAGNOSIS — E1122 Type 2 diabetes mellitus with diabetic chronic kidney disease: Secondary | ICD-10-CM | POA: Diagnosis not present

## 2022-07-19 DIAGNOSIS — R809 Proteinuria, unspecified: Secondary | ICD-10-CM | POA: Diagnosis not present

## 2022-07-19 DIAGNOSIS — N189 Chronic kidney disease, unspecified: Secondary | ICD-10-CM | POA: Diagnosis not present

## 2022-07-19 DIAGNOSIS — D638 Anemia in other chronic diseases classified elsewhere: Secondary | ICD-10-CM | POA: Diagnosis not present

## 2022-07-19 DIAGNOSIS — N170 Acute kidney failure with tubular necrosis: Secondary | ICD-10-CM | POA: Diagnosis not present

## 2022-07-19 DIAGNOSIS — E875 Hyperkalemia: Secondary | ICD-10-CM | POA: Diagnosis not present

## 2022-07-19 DIAGNOSIS — E1129 Type 2 diabetes mellitus with other diabetic kidney complication: Secondary | ICD-10-CM | POA: Diagnosis not present

## 2022-07-25 ENCOUNTER — Other Ambulatory Visit: Payer: Self-pay | Admitting: Family Medicine

## 2022-07-26 ENCOUNTER — Ambulatory Visit (INDEPENDENT_AMBULATORY_CARE_PROVIDER_SITE_OTHER): Payer: Medicare HMO | Admitting: Family Medicine

## 2022-07-26 VITALS — BP 133/72 | Ht 71.0 in | Wt 156.4 lb

## 2022-07-26 DIAGNOSIS — E1121 Type 2 diabetes mellitus with diabetic nephropathy: Secondary | ICD-10-CM

## 2022-07-26 DIAGNOSIS — E1169 Type 2 diabetes mellitus with other specified complication: Secondary | ICD-10-CM | POA: Diagnosis not present

## 2022-07-26 DIAGNOSIS — Z23 Encounter for immunization: Secondary | ICD-10-CM

## 2022-07-26 DIAGNOSIS — N1832 Chronic kidney disease, stage 3b: Secondary | ICD-10-CM

## 2022-07-26 DIAGNOSIS — E785 Hyperlipidemia, unspecified: Secondary | ICD-10-CM

## 2022-07-26 DIAGNOSIS — E1122 Type 2 diabetes mellitus with diabetic chronic kidney disease: Secondary | ICD-10-CM

## 2022-07-26 DIAGNOSIS — I10 Essential (primary) hypertension: Secondary | ICD-10-CM | POA: Diagnosis not present

## 2022-07-26 MED ORDER — AMLODIPINE BESYLATE 10 MG PO TABS
10.0000 mg | ORAL_TABLET | Freq: Every day | ORAL | 1 refills | Status: DC
Start: 2022-07-26 — End: 2023-05-03

## 2022-07-26 MED ORDER — SITAGLIPTIN PHOSPHATE 50 MG PO TABS: 50.0000 mg | ORAL_TABLET | Freq: Every day | ORAL | 1 refills | Status: DC

## 2022-07-26 MED ORDER — ATORVASTATIN CALCIUM 80 MG PO TABS
80.0000 mg | ORAL_TABLET | Freq: Every day | ORAL | 1 refills | Status: DC
Start: 2022-07-26 — End: 2023-04-12

## 2022-07-26 NOTE — Progress Notes (Signed)
   Subjective:    Patient ID: Charles Medina, male    DOB: Feb 09, 1941, 81 y.o.   MRN: [id number]  Diabetes He presents for his follow-up diabetic visit. He has type 2 diabetes mellitus. Risk factors for coronary artery disease include diabetes mellitus, hypertension and dyslipidemia. Current diabetic treatments: januvia.   Patient was stating that he is trying his best to eating healthy and staying physically active Goes to the gym multiple times a week Takes his medicine regular basis Sees renal doctor on a regular basis   Review of Systems     Objective:   Physical Exam  General-in no acute distress Eyes-no discharge Lungs-respiratory rate normal, CTA CV-no murmurs,RRR Extremities skin warm dry no edema Neuro grossly normal Behavior normal, alert No with edema in the ankles      Assessment & Plan:  1. Primary hypertension HTN- patient seen for follow-up regarding HTN.   Diet, medication compliance, appropriate labs and refills were completed.   Importance of keeping blood pressure under good control to lessen the risk of complications discussed Regular follow-up visits discussed  - Flu Vaccine QUAD High Dose(Fluad) - Basic metabolic panel - Lipid panel - Hepatic Function Panel - Hemoglobin A1c  2. Hyperlipidemia associated with type 2 diabetes mellitus (HCC) Hyperlipidemia-importance of diet, weight control, activity, compliance with medications discussed.   Recent labs reviewed.   Any additional labs or refills ordered.   Importance of keeping under good control discussed. Regular follow-up visits discussed  - Flu Vaccine QUAD High Dose(Fluad) - Basic metabolic panel - Lipid panel - Hepatic Function Panel - Hemoglobin A1c  3. Diabetic nephropathy associated with type 2 diabetes mellitus ([place]) The patient was seen today as part of a comprehensive visit for diabetes. The importance of keeping her A1c at or below 7 range was discussed.  Discussed diet, activity,  and medication compliance Emphasized healthy eating primarily with vegetables fruits and if utilizing meats lean meats such as chicken or fish grilled baked broiled Avoid sugary drinks Minimize and avoid processed foods Fit in regular physical activity preferably 25 to 30 minutes 4 times per week Standard follow-up visit recommended.  Patient aware lack of control and follow-up increases risk of diabetic complications. Regular follow-up visits Yearly ophthalmology Yearly foot exam  - Flu Vaccine QUAD High Dose(Fluad) - Basic metabolic panel - Lipid panel - Hepatic Function Panel - Hemoglobin A1c  4. Immunization due Today - Flu Vaccine QUAD High Dose(Fluad)  5. Type 2 diabetes mellitus with stage 3b chronic kidney disease, without long-term current use of insulin (HCC) Importance of healthy diet to minimize risk of kidney progression discussed.  We will do a follow-up visit in approximately 5 to 6 months sooner if any problems - Basic metabolic panel - Lipid panel - Hepatic Function Panel - Hemoglobin A1c

## 2022-07-27 ENCOUNTER — Telehealth: Payer: Self-pay | Admitting: Family Medicine

## 2022-07-27 ENCOUNTER — Encounter: Payer: Self-pay | Admitting: Family Medicine

## 2022-07-27 LAB — HEMOGLOBIN A1C
Est. average glucose Bld gHb Est-mCnc: 177 mg/dL
Hgb A1c MFr Bld: 7.8 % — ABNORMAL HIGH (ref 4.8–5.6)

## 2022-07-27 LAB — HEPATIC FUNCTION PANEL
ALT: 24 IU/L (ref 0–44)
AST: 19 IU/L (ref 0–40)
Albumin: 4.6 g/dL (ref 3.7–4.7)
Alkaline Phosphatase: 108 IU/L (ref 44–121)
Bilirubin Total: 0.4 mg/dL (ref 0.0–1.2)
Bilirubin, Direct: 0.12 mg/dL (ref 0.00–0.40)
Total Protein: 7.1 g/dL (ref 6.0–8.5)

## 2022-07-27 LAB — LIPID PANEL
Chol/HDL Ratio: 3.6 ratio (ref 0.0–5.0)
Cholesterol, Total: 128 mg/dL (ref 100–199)
HDL: 36 mg/dL — ABNORMAL LOW (ref >39)
LDL Chol Calc (NIH): 78 mg/dL (ref 0–99)
Triglycerides: 66 mg/dL (ref 0–149)
VLDL Cholesterol Cal: 14 mg/dL (ref 5–40)

## 2022-07-27 LAB — BASIC METABOLIC PANEL
BUN/Creatinine Ratio: 11 (ref 10–24)
BUN: 21 mg/dL (ref 8–27)
CO2: 22 mmol/L (ref 20–29)
Calcium: 9.3 mg/dL (ref 8.6–10.2)
Chloride: 103 mmol/L (ref 96–106)
Creatinine, Ser: 1.95 mg/dL — ABNORMAL HIGH (ref 0.76–1.27)
Glucose: 171 mg/dL — ABNORMAL HIGH (ref 70–99)
Potassium: 5 mmol/L (ref 3.5–5.2)
Sodium: 139 mmol/L (ref 134–144)
eGFR: 34 mL/min/{1.73_m2} — ABNORMAL LOW (ref >59)

## 2022-07-27 NOTE — Telephone Encounter (Signed)
Patient is wanting a generic brand for Sitagliptin 50 mg 90 day supply too expensive costing $65 and his next refill will be $85. [employer]

## 2022-07-28 NOTE — Telephone Encounter (Signed)
So unfortunately his kidney condition prevents him from being on metformin which is dirt cheap Previously he was getting this medicine monthly if it is more affordable for him to just do this as a monthly medicine I would recommend switching it to a monthly medicine If he still says he cannot afford it please consult [employer] or any of the other [facility] social service helpers to see if there is pharmacy support that could be done

## 2022-08-01 NOTE — Telephone Encounter (Signed)
Possibly needs Theatre stage manager with [facility] or similar might be able to help him with the cost unfortunately with his kidney function he is pretty much stuck with what I have prescribed-FYI

## 2022-08-01 NOTE — Telephone Encounter (Signed)
Patient states he could pay for it he just doesn't want to pay that much for one bottle of medication but he will check around and look through good Rx to see if he can fin it cheaper for next month and let us know.

## 2022-08-26 ENCOUNTER — Telehealth: Payer: Self-pay | Admitting: Family Medicine

## 2022-08-26 DIAGNOSIS — E1122 Type 2 diabetes mellitus with diabetic chronic kidney disease: Secondary | ICD-10-CM

## 2022-08-26 MED ORDER — SITAGLIPTIN PHOSPHATE 50 MG PO TABS: 50.0000 mg | ORAL_TABLET | Freq: Every day | ORAL | 0 refills | Status: DC

## 2022-08-26 NOTE — Telephone Encounter (Signed)
Patient aware.

## 2022-08-26 NOTE — Telephone Encounter (Signed)
Patient went to [facility] to pick up prescription for Januivia 50 mg the cost for 90 day supply was $1000.00 dollars patient couldn't afford medication he has been out for two days now. He wants to know is there a cheaper brand he can take that he can afford.

## 2022-08-26 NOTE — Telephone Encounter (Signed)
Prescriptions sent electronically to pharmacy. Left message to return call

## 2022-08-26 NOTE — Telephone Encounter (Signed)
Patient needs new prescription for sitagliptin 50 mg sent to [facility] 90 day supply its cheaper than [employer]

## 2022-08-31 NOTE — Telephone Encounter (Signed)
Patient called back to check on this

## 2022-09-01 NOTE — Telephone Encounter (Signed)
This is a complex issue He cannot afford the 1 medicine we prescribed.  He should be able to afford glipizide 2.5 mg XL  1 daily.  It would be necessary for him to monitor his sugars twice daily for the first few weeks that he is on this medicine to make sure he is not having low sugar spells.  It is also important for him not to skip meals.  If he is having any low sugar spells such as below 90 or causing him to feel shaky or tremulous to stop the medicine. I would also like the patient to do a follow-up office visit somewhere within the next 2 to 3 weeks to see how things are going

## 2022-09-02 MED ORDER — GLIPIZIDE 5 MG PO TABS: ORAL_TABLET | ORAL | 0 refills | Status: DC

## 2022-09-02 NOTE — Telephone Encounter (Signed)
Patient has been advised per provider notes and recommendations, he verbalizes understanding

## 2022-09-20 ENCOUNTER — Other Ambulatory Visit: Payer: Self-pay | Admitting: Family Medicine

## 2022-09-20 ENCOUNTER — Ambulatory Visit (INDEPENDENT_AMBULATORY_CARE_PROVIDER_SITE_OTHER): Payer: Medicare HMO

## 2022-09-20 VITALS — Ht 71.0 in | Wt 156.0 lb

## 2022-09-20 DIAGNOSIS — Z0000 Encounter for general adult medical examination without abnormal findings: Secondary | ICD-10-CM

## 2022-09-20 NOTE — Patient Instructions (Addendum)
Charles Medina , Thank you for taking time to come for your Medicare Wellness Visit. I appreciate your ongoing commitment to your health goals. Please review the following plan we discussed and let me know if I can assist you in the future.   Screening recommendations/referrals: Colonoscopy: 12/20/19 Recommended yearly ophthalmology/optometry visit for glaucoma screening and checkup Recommended yearly dental visit for hygiene and checkup  Vaccinations: Influenza vaccine: 07/26/22 Pneumococcal vaccine: 06/26/15 Tdap vaccine: n/d Shingles vaccine: Shingrix 08/26/19   Covid-19: 01/07/20, 02/04/20  Advanced directives: no  Conditions/risks identified: none  Next appointment: Follow up in one year for your annual wellness visit. 09/22/23 @ 9:15 am by phone  Preventive Care 65 Years and Older, Male Preventive care refers to lifestyle choices and visits with your health care provider that can promote health and wellness. What does preventive care include? A yearly physical exam. This is also called an annual well check. Dental exams once or twice a year. Routine eye exams. Ask your health care provider how often you should have your eyes checked. Personal lifestyle choices, including: Daily care of your teeth and gums. Regular physical activity. Eating a healthy diet. Avoiding tobacco and drug use. Limiting alcohol use. Practicing safe sex. Taking low doses of aspirin every day. Taking vitamin and mineral supplements as recommended by your health care provider. What happens during an annual well check? The services and screenings done by your health care provider during your annual well check will depend on your age, overall health, lifestyle risk factors, and family history of disease. Counseling  Your health care provider may ask you questions about your: Alcohol use. Tobacco use. Drug use. Emotional well-being. Home and relationship well-being. Sexual activity. Eating habits. History  of falls. Memory and ability to understand (cognition). Work and work Statistician. Screening  You may have the following tests or measurements: Height, weight, and BMI. Blood pressure. Lipid and cholesterol levels. These may be checked every 5 years, or more frequently if you are over 7 years old. Skin check. Lung cancer screening. You may have this screening every year starting at age 55 if you have a 30-pack-year history of smoking and currently smoke or have quit within the past 15 years. Fecal occult blood test (FOBT) of the stool. You may have this test every year starting at age 35. Flexible sigmoidoscopy or colonoscopy. You may have a sigmoidoscopy every 5 years or a colonoscopy every 10 years starting at age 78. Prostate cancer screening. Recommendations will vary depending on your family history and other risks. Hepatitis C blood test. Hepatitis B blood test. Sexually transmitted disease (STD) testing. Diabetes screening. This is done by checking your blood sugar (glucose) after you have not eaten for a while (fasting). You may have this done every 1-3 years. Abdominal aortic aneurysm (AAA) screening. You may need this if you are a current or former smoker. Osteoporosis. You may be screened starting at age 57 if you are at high risk. Talk with your health care provider about your test results, treatment options, and if necessary, the need for more tests. Vaccines  Your health care provider may recommend certain vaccines, such as: Influenza vaccine. This is recommended every year. Tetanus, diphtheria, and acellular pertussis (Tdap, Td) vaccine. You may need a Td booster every 10 years. Zoster vaccine. You may need this after age 71. Pneumococcal 13-valent conjugate (PCV13) vaccine. One dose is recommended after age 29. Pneumococcal polysaccharide (PPSV23) vaccine. One dose is recommended after age 10. Talk to your health care  provider about which screenings and vaccines you need  and how often you need them. This information is not intended to replace advice given to you by your health care provider. Make sure you discuss any questions you have with your health care provider. Document Released: 11/20/2015 Document Revised: 07/13/2016 Document Reviewed: 08/25/2015 Elsevier Interactive Patient Education  2017 [place] Prevention in the Home Falls can cause injuries. They can happen to people of all ages. There are many things you can do to make your home safe and to help prevent falls. What can I do on the outside of my home? Regularly fix the edges of walkways and driveways and fix any cracks. Remove anything that might make you trip as you walk through a door, such as a raised step or threshold. Trim any bushes or trees on the path to your home. Use bright outdoor lighting. Clear any walking paths of anything that might make someone trip, such as rocks or tools. Regularly check to see if handrails are loose or broken. Make sure that both sides of any steps have handrails. Any raised decks and porches should have guardrails on the edges. Have any leaves, snow, or ice cleared regularly. Use sand or salt on walking paths during winter. Clean up any spills in your garage right away. This includes oil or grease spills. What can I do in the bathroom? Use night lights. Install grab bars by the toilet and in the tub and shower. Do not use towel bars as grab bars. Use non-skid mats or decals in the tub or shower. If you need to sit down in the shower, use a plastic, non-slip stool. Keep the floor dry. Clean up any water that spills on the floor as soon as it happens. Remove soap buildup in the tub or shower regularly. Attach bath mats securely with double-sided non-slip rug tape. Do not have throw rugs and other things on the floor that can make you trip. What can I do in the bedroom? Use night lights. Make sure that you have a light by your bed that is easy  to reach. Do not use any sheets or blankets that are too big for your bed. They should not hang down onto the floor. Have a firm chair that has side arms. You can use this for support while you get dressed. Do not have throw rugs and other things on the floor that can make you trip. What can I do in the kitchen? Clean up any spills right away. Avoid walking on wet floors. Keep items that you use a lot in easy-to-reach places. If you need to reach something above you, use a strong step stool that has a grab bar. Keep electrical cords out of the way. Do not use floor polish or wax that makes floors slippery. If you must use wax, use non-skid floor wax. Do not have throw rugs and other things on the floor that can make you trip. What can I do with my stairs? Do not leave any items on the stairs. Make sure that there are handrails on both sides of the stairs and use them. Fix handrails that are broken or loose. Make sure that handrails are as long as the stairways. Check any carpeting to make sure that it is firmly attached to the stairs. Fix any carpet that is loose or worn. Avoid having throw rugs at the top or bottom of the stairs. If you do have throw rugs, attach them to the  floor with carpet tape. Make sure that you have a light switch at the top of the stairs and the bottom of the stairs. If you do not have them, ask someone to add them for you. What else can I do to help prevent falls? Wear shoes that: Do not have high heels. Have rubber bottoms. Are comfortable and fit you well. Are closed at the toe. Do not wear sandals. If you use a stepladder: Make sure that it is fully opened. Do not climb a closed stepladder. Make sure that both sides of the stepladder are locked into place. Ask someone to hold it for you, if possible. Clearly mark and make sure that you can see: Any grab bars or handrails. First and last steps. Where the edge of each step is. Use tools that help you move  around (mobility aids) if they are needed. These include: Canes. Walkers. Scooters. Crutches. Turn on the lights when you go into a dark area. Replace any light bulbs as soon as they burn out. Set up your furniture so you have a clear path. Avoid moving your furniture around. If any of your floors are uneven, fix them. If there are any pets around you, be aware of where they are. Review your medicines with your doctor. Some medicines can make you feel dizzy. This can increase your chance of falling. Ask your doctor what other things that you can do to help prevent falls. This information is not intended to replace advice given to you by your health care provider. Make sure you discuss any questions you have with your health care provider. Document Released: 08/20/2009 Document Revised: 03/31/2016 Document Reviewed: 11/28/2014 Elsevier Interactive Patient Education  2017 [employer].

## 2022-09-20 NOTE — Progress Notes (Signed)
Virtual Visit via Telephone Note  I connected with  Charles Medina on 09/20/22 at 10:00 AM EST by telephone and verified that I am speaking with the correct person using two identifiers.  Location: Patient: home Provider: [facility] Persons participating in the virtual visit: patient/Nurse Health Advisor   I discussed the limitations, risks, security and privacy concerns of performing an evaluation and management service by telephone and the availability of in person appointments. The patient expressed understanding and agreed to proceed.  Interactive audio and video telecommunications were attempted between this nurse and patient, however failed, due to patient having technical difficulties OR patient did not have access to video capability.  We continued and completed visit with audio only.  Some vital signs may be absent or patient reported.   Dionisio Keone, LPN  Subjective:   Charles Medina is a 81 y.o. male who presents for Medicare Annual/Subsequent preventive examination.  Review of Systems     Cardiac Risk Factors include: advanced age (>25mn, >>6women);diabetes mellitus;hypertension;dyslipidemia;male gender     Objective:    There were no vitals filed for this visit. There is no height or weight on file to calculate BMI.     09/20/2022   10:09 AM 09/14/2021    9:54 AM 06/02/2016   11:05 AM 04/11/2016    4:22 PM 08/07/2015    1:25 PM 06/25/2015    2:42 PM 06/23/2015   12:20 PM  Advanced Directives  Does Patient Have a Medical Advance Directive? No Yes Yes No;Yes Yes Yes Yes  Type of ACorporate treasurerof AA[place]Living will [place]leasant GapLiving will Living will Living will;Healthcare Power of [place] will  Does patient want to make changes to medical advance directive?     No - Patient declined  No - Patient declined  Copy of [place] Chart?  No  - copy requested   No - copy requested  No - copy requested  Would patient like information on creating a medical advance directive? No - Patient declined No - Patient declined         Current Medications (verified) Outpatient Encounter Medications as of 09/20/2022  Medication Sig   amLODipine (NORVASC) 10 MG tablet Take 1 tablet (10 mg total) by mouth daily.   atorvastatin (LIPITOR) 80 MG tablet Take 1 tablet (80 mg total) by mouth daily.   glipiZIDE (GLUCOTROL) 5 MG tablet TAKE HALF A TAB DAILY - MONITOR BLOOD SUGARS   Multiple Vitamins-Minerals (EMERGEN-C IMMUNE PO) Take 1 packet by mouth daily as needed (immune health/energy).   sildenafil (REVATIO) 20 MG tablet TAKE 3 TO 5 TABLETS AS NEEDED AND INSTRUCTED.   sodium zirconium cyclosilicate (LOKELMA) 10 g PACK packet Take 10 g by mouth , dissolved into beverage (three) 3 times daily for 2 days  Then take 10 g by mouth dissolved into beverage once daily for 7 days   telmisartan (MICARDIS) 20 MG tablet Take by mouth.   famotidine (PEPCID) 40 MG tablet Take 1 tablet (40 mg total) by mouth at bedtime. (Patient not taking: Reported on 09/20/2022)   sitaGLIPtin (JANUVIA) 50 MG tablet Take 1 tablet (50 mg total) by mouth daily. (Patient not taking: Reported on 09/20/2022)   traMADol (ULTRAM) 50 MG tablet Take 1 tablet (50 mg total) by mouth every 6 (six) hours as needed. (Patient not taking: Reported on 09/20/2022)   No facility-administered encounter medications on file as  of 09/20/2022.    Allergies (verified) Patient has no known allergies.   History: Past Medical History:  Diagnosis Date   AAA (abdominal aortic aneurysm) (HCC)    Arthritis    Diabetes mellitus without complication ([place])    Hypertension    Prostate cancer ([place])    Type 2 diabetes mellitus ([place])    Past Surgical History:  Procedure Laterality Date   ABDOMINAL AORTIC ENDOVASCULAR STENT GRAFT N/A 06/25/2015   Procedure: ABDOMINAL AORTIC ENDOVASCULAR STENT GRAFT;   Surgeon: Conrad Fortuna [place], MD;  Location: [place];  Service: Vascular;  Laterality: N/A;   BIOPSY  12/20/2019   Procedure: BIOPSY;  Surgeon: Rogene Houston, MD;  [facility];  Service: Endoscopy;;  esophagus   COLONOSCOPY  11/14/2012   Procedure: COLONOSCOPY;  Surgeon: Rogene Houston, MD;  [facility];  Service: Endoscopy;  Laterality: N/A;  830   COLONOSCOPY WITH PROPOFOL N/A 12/20/2019   Procedure: COLONOSCOPY WITH PROPOFOL;  Surgeon: Rogene Houston, MD;  [facility];  Service: Endoscopy;  Laterality: N/A;  940   ESOPHAGOGASTRODUODENOSCOPY (EGD) WITH PROPOFOL N/A 12/20/2019   Procedure: ESOPHAGOGASTRODUODENOSCOPY (EGD) WITH PROPOFOL;  Surgeon: Rogene Houston, MD;  [facility];  Service: Endoscopy;  Laterality: N/A;   POLYPECTOMY  12/20/2019   Procedure: POLYPECTOMY;  Surgeon: Rogene Houston, MD;  [facility];  Service: Endoscopy;;  colon   PROSTATECTOMY  2011   Family History  Family history unknown: Yes   Social History   Socioeconomic History   Marital status: Widowed    Spouse name: Not on file   Number of children: 6   Years of education: Not on file   Highest education level: Not on file  Occupational History   Not on file  Tobacco Use   Smoking status: Former    Packs/day: 0.50    Years: 23.00    Total pack years: 11.50    Types: Cigarettes    Start date: 05/08/1956    Quit date: 11/15/1971    Years since quitting: 50.8   Smokeless tobacco: Never  Substance and Sexual Activity   Alcohol use: No    Alcohol/week: 0.0 standard drinks of alcohol   Drug use: No   Sexual activity: Yes  Other Topics Concern   Not on file  Social History Narrative   28 grandchildren   6 great grandchildren   Social Determinants of Health   Financial Resource Strain: Low Risk  (09/20/2022)   Overall Financial Resource Strain (CARDIA)    Difficulty of Paying Living Expenses: Not hard at all  Food Insecurity: No Food Insecurity  (09/20/2022)   Hunger Vital Sign    Worried About Running Out of Food in the Last Year: Never true    [place] in the Last Year: Never true  Transportation Needs: No Transportation Needs (09/20/2022)   PRAPARE - Hydrologist (Medical): No    Lack of Transportation (Non-Medical): No  Physical Activity: Sufficiently Active (09/20/2022)   Exercise Vital Sign    Days of Exercise per Week: 3 days    Minutes of Exercise per Session: 60 min  Stress: No Stress Concern Present (09/20/2022)   Cross    Feeling of Stress : Not at all  Social Connections: Moderately Integrated (09/20/2022)   Social Connection and Isolation Panel [NHANES]    Frequency of Communication with Friends and Family:  More than three times a week    Frequency of Social Gatherings with Friends and Family: More than three times a week    Attends Religious Services: More than 4 times per year    Active Member of Clubs or Organizations: Yes    Attends Archivist Meetings: More than 4 times per year    Marital Status: Widowed    Tobacco Counseling Counseling given: Not Answered   Clinical Intake:  Pre-visit preparation completed: Yes  Pain : No/denies pain     Nutritional Risks: None Diabetes: Yes CBG done?: No Did pt. bring in CBG monitor from home?: No  How often do you need to have someone help you when you read instructions, pamphlets, or other written materials from your doctor or pharmacy?: 1 - Never  Diabetic?yes Nutrition Risk Assessment:  Has the patient had any N/V/D within the last 2 months?  Yes  Does the patient have any non-healing wounds?  No  Has the patient had any unintentional weight loss or weight gain?  No   Diabetes:  Is the patient diabetic?  Yes  If diabetic, was a CBG obtained today?  No  Did the patient bring in their glucometer from home?  No  How often do you  monitor your CBG's? Once/day.   Financial Strains and Diabetes Management:  Are you having any financial strains with the device, your supplies or your medication? No .  Does the patient want to be seen by Chronic Care Management for management of their diabetes?  No  Would the patient like to be referred to a Nutritionist or for Diabetic Management?  No   Diabetic Exams:  Diabetic Eye Exam: Completed 11/11/20. Overdue for diabetic eye exam. Pt has been advised about the importance in completing this exam. .  Diabetic Foot Exam: Completed 07/20/21. Pt has been advised about the importance in completing this exam.   Interpreter Needed?: No  Information entered by :: Kirke Shaggy, LPN   Activities of Daily Living    09/20/2022   10:12 AM  In your present state of health, do you have any difficulty performing the following activities:  Hearing? 0  Vision? 0  Difficulty concentrating or making decisions? 0  Walking or climbing stairs? 0  Dressing or bathing? 0  Doing errands, shopping? 0  Preparing Food and eating ? N  Using the Toilet? N  In the past six months, have you accidently leaked urine? N  Do you have problems with loss of bowel control? N  Managing your Medications? N  Managing your Finances? N  Housekeeping or managing your Housekeeping? N    Patient Care Team: Kathyrn Drown, MD as PCP - General (Family Medicine)  Indicate any recent Medical Services you may have received from other than [facility] providers in the past year (date may be approximate).     Assessment:   This is a routine wellness examination for Charles Medina.  Hearing/Vision screen Hearing Screening - Comments:: No aids Vision Screening - Comments:: readers  Dietary issues and exercise activities discussed: Current Exercise Habits: Home exercise routine, Type of exercise: strength training/weights;walking, Time (Minutes): 60, Frequency (Times/Week): 3, Weekly Exercise (Minutes/Week): 180, Intensity:  Mild   Goals Addressed             This Visit's Progress    DIET - EAT MORE FRUITS AND VEGETABLES         Depression Screen    09/20/2022   10:06 AM 09/14/2021  9:50 AM 07/20/2021    8:33 AM 01/27/2021    8:25 AM 09/22/2020    8:38 AM 09/22/2020    8:33 AM 09/19/2018    8:10 AM  PHQ 2/9 Scores  PHQ - 2 Score 0 0 0 0 0 0 0  PHQ- 9 Score 0          Fall Risk    09/20/2022   10:12 AM 09/14/2021    9:55 AM 07/20/2021    8:32 AM 01/27/2021    8:25 AM 09/22/2020    8:25 AM  Fall Risk   Falls in the past year? 0 0 0 0 0  Number falls in past yr: 0 0 0    Injury with Fall? 0 0 0    Risk for fall due to : No Fall Risks No Fall Risks No Fall Risks    Follow up Falls prevention discussed;Falls evaluation completed Falls prevention discussed Falls evaluation completed Falls evaluation completed Falls evaluation completed    FALL RISK PREVENTION PERTAINING TO THE HOME:  Any stairs in or around the home? Yes  If so, are there any without handrails? No  Home free of loose throw rugs in walkways, pet beds, electrical cords, etc? Yes  Adequate lighting in your home to reduce risk of falls? Yes   ASSISTIVE DEVICES UTILIZED TO PREVENT FALLS:  Life alert? No  Use of a cane, walker or w/c? No  Grab bars in the bathroom? No  Shower chair or bench in shower? No  Elevated toilet seat or a handicapped toilet? No    Cognitive Function:        09/20/2022   10:12 AM  6CIT Screen  What Year? 0 points  What month? 0 points  What time? 0 points  Count back from 20 0 points  Months in reverse 0 points  Repeat phrase 0 points  Total Score 0 points    Immunizations Immunization History  Administered Date(s) Administered   DT (Pediatric) 12/12/2013   Fluad Quad(high Dose 65+) 09/22/2020, 07/26/2022   Influenza,inj,Quad PF,6+ Mos 08/27/2014, 09/21/2015, 08/11/2016, 10/02/2017, 09/19/2018, 07/23/2019   Influenza-Unspecified 09/14/2021   Moderna Sars-Covid-2 Vaccination  01/07/2020, 02/04/2020   Pneumococcal Conjugate-13 05/13/2014   Pneumococcal Polysaccharide-23 06/26/2015   Zoster Recombinat (Shingrix) 08/26/2019    TDAP status: Due, Education has been provided regarding the importance of this vaccine. Advised may receive this vaccine at local pharmacy or Health Dept. Aware to provide a copy of the vaccination record if obtained from local pharmacy or Health Dept. Verbalized acceptance and understanding.  Flu Vaccine status: Up to date  Pneumococcal vaccine status: Up to date  Covid-19 vaccine status: Completed vaccines  Qualifies for Shingles Vaccine? Yes   Zostavax completed No   Shingrix Completed?: No.    Education has been provided regarding the importance of this vaccine. Patient has been advised to call insurance company to determine out of pocket expense if they have not yet received this vaccine. Advised may also receive vaccine at local pharmacy or Health Dept. Verbalized acceptance and understanding.  Screening Tests Health Maintenance  Topic Date Due   TETANUS/TDAP  Never done   Zoster Vaccines- Shingrix (2 of 2) 10/21/2019   OPHTHALMOLOGY EXAM  11/11/2021   FOOT EXAM  07/20/2022   HEMOGLOBIN A1C  01/24/2023   Diabetic kidney evaluation - Urine ACR  06/10/2023   Diabetic kidney evaluation - GFR measurement  07/27/2023   Medicare Annual Wellness (AWV)  09/21/2023   COLONOSCOPY (Pts 45-74yr Insurance coverage  will need to be confirmed)  12/19/2024   Pneumonia Vaccine 18+ Years old  Completed   INFLUENZA VACCINE  Completed   HPV VACCINES  Aged Out   COVID-19 Vaccine  Discontinued    Health Maintenance  Health Maintenance Due  Topic Date Due   TETANUS/TDAP  Never done   Zoster Vaccines- Shingrix (2 of 2) 10/21/2019   OPHTHALMOLOGY EXAM  11/11/2021   FOOT EXAM  07/20/2022    Colorectal cancer screening: Type of screening: Colonoscopy. Completed 12/20/19. Repeat every 5 years- aged out  Lung Cancer Screening: (Low Dose CT  Chest recommended if Age 64-80 years, 30 pack-year currently smoking OR have quit w/in 15years.) does not qualify.   Additional Screening:  Hepatitis C Screening: does not qualify; Completed no  Vision Screening: Recommended annual ophthalmology exams for early detection of glaucoma and other disorders of the eye. Is the patient up to date with their annual eye exam?  Yes  Who is the provider or what is the name of the office in which the patient attends annual eye exams? [facility] If pt is not established with a provider, would they like to be referred to a provider to establish care? No .   Dental Screening: Recommended annual dental exams for proper oral hygiene  Community Resource Referral / Chronic Care Management: CRR required this visit?  No   CCM required this visit?  No      Plan:     I have personally reviewed and noted the following in the patient's chart:   Medical and social history Use of alcohol, tobacco or illicit drugs  Current medications and supplements including opioid prescriptions. Patient is not currently taking opioid prescriptions. Functional ability and status Nutritional status Physical activity Advanced directives List of other physicians Hospitalizations, surgeries, and ER visits in previous 12 months Vitals Screenings to include cognitive, depression, and falls Referrals and appointments  In addition, I have reviewed and discussed with patient certain preventive protocols, quality metrics, and best practice recommendations. A written personalized care plan for preventive services as well as general preventive health recommendations were provided to patient.     Dionisio Hance, LPN   28/41/3244   Nurse Notes:

## 2022-09-26 ENCOUNTER — Telehealth: Payer: Self-pay

## 2022-09-26 NOTE — Telephone Encounter (Signed)
Caller name: IHOR MEINZER  On DPR?: Yes  Call back number: [phone] (mobile)  Provider they see: Kathyrn Drown, MD  Reason for call:Pt is wanting to see if Dr Nicki Reaper will change his diabetic reading he wants something that will go on back of his arm so he want prick his finger anymore?

## 2022-09-28 ENCOUNTER — Other Ambulatory Visit: Payer: Self-pay

## 2022-09-28 DIAGNOSIS — N1832 Chronic kidney disease, stage 3b: Secondary | ICD-10-CM

## 2022-09-28 MED ORDER — BLOOD GLUCOSE MONITOR KIT: PACK | 1 refills | Status: AC

## 2022-09-28 NOTE — Telephone Encounter (Signed)
Patient has been informed per drs recommendations , requested a new glucometer and supplies to be sent to pharmacy.

## 2022-09-28 NOTE — Telephone Encounter (Signed)
Under Medicare guidelines continuous glucose monitoring such as what he is referring will only be covered by individuals who are on insulin shots.  See how often Charles Medina is pricking his finger currently.  We may be able to cut down the frequency

## 2022-10-06 ENCOUNTER — Other Ambulatory Visit: Payer: Self-pay | Admitting: Family Medicine

## 2023-01-24 ENCOUNTER — Ambulatory Visit: Payer: Medicare HMO | Admitting: Family Medicine

## 2023-02-15 ENCOUNTER — Ambulatory Visit (INDEPENDENT_AMBULATORY_CARE_PROVIDER_SITE_OTHER): Payer: Medicare HMO | Admitting: Family Medicine

## 2023-02-15 ENCOUNTER — Other Ambulatory Visit (HOSPITAL_COMMUNITY)
Admission: RE | Admit: 2023-02-15 | Discharge: 2023-02-15 | Disposition: A | Discharge status: Home / Self Care | Payer: Medicare HMO | Source: Ambulatory Visit | Attending: Family Medicine | Admitting: Family Medicine

## 2023-02-15 ENCOUNTER — Emergency Department (HOSPITAL_COMMUNITY)
Admission: EM | Admit: 2023-02-15 | Discharge: 2023-02-15 | Disposition: A | Discharge status: Home / Self Care | Payer: Medicare HMO | Attending: Emergency Medicine | Admitting: Emergency Medicine

## 2023-02-15 ENCOUNTER — Other Ambulatory Visit: Payer: Self-pay

## 2023-02-15 VITALS — BP 117/78 | HR 96 | Ht 71.0 in | Wt 133.2 lb

## 2023-02-15 DIAGNOSIS — E1121 Type 2 diabetes mellitus with diabetic nephropathy: Secondary | ICD-10-CM | POA: Diagnosis not present

## 2023-02-15 DIAGNOSIS — Z79899 Other long term (current) drug therapy: Secondary | ICD-10-CM | POA: Diagnosis not present

## 2023-02-15 DIAGNOSIS — I10 Essential (primary) hypertension: Secondary | ICD-10-CM | POA: Insufficient documentation

## 2023-02-15 DIAGNOSIS — R739 Hyperglycemia, unspecified: Secondary | ICD-10-CM

## 2023-02-15 DIAGNOSIS — R634 Abnormal weight loss: Secondary | ICD-10-CM

## 2023-02-15 DIAGNOSIS — N1832 Chronic kidney disease, stage 3b: Secondary | ICD-10-CM | POA: Diagnosis not present

## 2023-02-15 DIAGNOSIS — E119 Type 2 diabetes mellitus without complications: Secondary | ICD-10-CM | POA: Insufficient documentation

## 2023-02-15 DIAGNOSIS — Z7984 Long term (current) use of oral hypoglycemic drugs: Secondary | ICD-10-CM | POA: Insufficient documentation

## 2023-02-15 DIAGNOSIS — Z8546 Personal history of malignant neoplasm of prostate: Secondary | ICD-10-CM | POA: Insufficient documentation

## 2023-02-15 DIAGNOSIS — E1165 Type 2 diabetes mellitus with hyperglycemia: Secondary | ICD-10-CM | POA: Insufficient documentation

## 2023-02-15 DIAGNOSIS — E1122 Type 2 diabetes mellitus with diabetic chronic kidney disease: Secondary | ICD-10-CM

## 2023-02-15 LAB — POCT URINALYSIS DIP (CLINITEK)
Bilirubin, UA: NEGATIVE
Blood, UA: NEGATIVE
Glucose, UA: >=1000 mg/dL — AB
Leukocytes, UA: NEGATIVE
Nitrite, UA: NEGATIVE
Spec Grav, UA: 1.015 (ref 1.010–1.025)
Urobilinogen, UA: 0.2 E.U./dL
pH, UA: 5 (ref 5.0–8.0)

## 2023-02-15 LAB — BASIC METABOLIC PANEL
Anion gap: 11 (ref 5–15)
Anion gap: 13 (ref 5–15)
BUN: 48 mg/dL — ABNORMAL HIGH (ref 8–23)
BUN: 48 mg/dL — ABNORMAL HIGH (ref 8–23)
CO2: 22 mmol/L (ref 22–32)
CO2: 23 mmol/L (ref 22–32)
Calcium: 9.6 mg/dL (ref 8.9–10.3)
Calcium: 9.1 mg/dL (ref 8.9–10.3)
Chloride: 91 mmol/L — ABNORMAL LOW (ref 98–111)
Chloride: 90 mmol/L — ABNORMAL LOW (ref 98–111)
Creatinine, Ser: 2.7 mg/dL — ABNORMAL HIGH (ref 0.61–1.24)
Creatinine, Ser: 2.7 mg/dL — ABNORMAL HIGH (ref 0.61–1.24)
GFR, Estimated: 23 mL/min — ABNORMAL LOW (ref >60)
GFR, Estimated: 23 mL/min — ABNORMAL LOW (ref >60)
Glucose, Bld: 582 mg/dL (ref 70–99)
Glucose, Bld: 626 mg/dL (ref 70–99)
Potassium: 5.7 mmol/L — ABNORMAL HIGH (ref 3.5–5.1)
Potassium: 5.2 mmol/L — ABNORMAL HIGH (ref 3.5–5.1)
Sodium: 124 mmol/L — ABNORMAL LOW (ref 135–145)
Sodium: 126 mmol/L — ABNORMAL LOW (ref 135–145)

## 2023-02-15 LAB — HEPATIC FUNCTION PANEL
ALT: 32 U/L (ref 0–44)
AST: 28 U/L (ref 15–41)
Albumin: 4.1 g/dL (ref 3.5–5.0)
Alkaline Phosphatase: 112 U/L (ref 38–126)
Bilirubin, Direct: 0.2 mg/dL (ref 0.0–0.2)
Indirect Bilirubin: 0.9 mg/dL (ref 0.3–0.9)
Total Bilirubin: 1.1 mg/dL (ref 0.3–1.2)
Total Protein: 7.4 g/dL (ref 6.5–8.1)

## 2023-02-15 LAB — CBC WITH DIFFERENTIAL/PLATELET
Abs Immature Granulocytes: 0.03 10*3/uL (ref 0.00–0.07)
Basophils Absolute: 0 10*3/uL (ref 0.0–0.1)
Basophils Relative: 1 %
Eosinophils Absolute: 0 10*3/uL (ref 0.0–0.5)
Eosinophils Relative: 0 %
HCT: 37.9 % — ABNORMAL LOW (ref 39.0–52.0)
Hemoglobin: 12.6 g/dL — ABNORMAL LOW (ref 13.0–17.0)
Immature Granulocytes: 1 %
Lymphocytes Relative: 19 %
Lymphs Abs: 1.2 10*3/uL (ref 0.7–4.0)
MCH: 30.9 pg (ref 26.0–34.0)
MCHC: 33.2 g/dL (ref 30.0–36.0)
MCV: 92.9 fL (ref 80.0–100.0)
Monocytes Absolute: 0.6 10*3/uL (ref 0.1–1.0)
Monocytes Relative: 10 %
Neutro Abs: 4.4 10*3/uL (ref 1.7–7.7)
Neutrophils Relative %: 69 %
Platelets: 151 10*3/uL (ref 150–400)
RBC: 4.08 MIL/uL — ABNORMAL LOW (ref 4.22–5.81)
RDW: 13.2 % (ref 11.5–15.5)
WBC: 6.3 10*3/uL (ref 4.0–10.5)
nRBC: 0 % (ref 0.0–0.2)

## 2023-02-15 LAB — CBG MONITORING, ED
Glucose-Capillary: 575 mg/dL (ref 70–99)
Glucose-Capillary: 419 mg/dL — ABNORMAL HIGH (ref 70–99)

## 2023-02-15 LAB — CBC
HCT: 35.4 % — ABNORMAL LOW (ref 39.0–52.0)
Hemoglobin: 11.9 g/dL — ABNORMAL LOW (ref 13.0–17.0)
MCH: 31.2 pg (ref 26.0–34.0)
MCHC: 33.6 g/dL (ref 30.0–36.0)
MCV: 92.7 fL (ref 80.0–100.0)
Platelets: 126 10*3/uL — ABNORMAL LOW (ref 150–400)
RBC: 3.82 MIL/uL — ABNORMAL LOW (ref 4.22–5.81)
RDW: 13.2 % (ref 11.5–15.5)
WBC: 5.2 10*3/uL (ref 4.0–10.5)
nRBC: 0 % (ref 0.0–0.2)

## 2023-02-15 LAB — GLUCOSE, POCT (MANUAL RESULT ENTRY): POC Glucose: 518 mg/dl — AB (ref 70–99)

## 2023-02-15 MED ORDER — SODIUM ZIRCONIUM CYCLOSILICATE 5 G PO PACK
10.0000 g | PACK | ORAL | Status: AC
  Administered 2023-02-15: 10 g via ORAL
  Filled 2023-02-15: qty 2

## 2023-02-15 MED ORDER — SODIUM CHLORIDE 0.9 % IV BOLUS
1000.0000 mL | Freq: Once | INTRAVENOUS | Status: AC
  Administered 2023-02-15: 1000 mL via INTRAVENOUS

## 2023-02-15 MED ORDER — INSULIN ASPART 100 UNIT/ML IJ SOLN
10.0000 [IU] | Freq: Once | INTRAMUSCULAR | Status: AC
  Administered 2023-02-15: 10 [IU] via SUBCUTANEOUS
  Filled 2023-02-15: qty 1

## 2023-02-15 NOTE — Progress Notes (Signed)
   Subjective:    Patient ID: Charles Medina, male    DOB: 10-07-1941, 82 y.o.   MRN: [id number]  HPI Patient arrives today for 6 month follow up diabetes. Patient needs refill on sitagliptin.  Patient states he has been out of his medicine for several weeks.  He relates increased thirst increased urination but has noticed that he is lost some weight.  States his energy level doing okay.  Denies any chest tightness pressure pain or shortness of breath denies any fever chills hematuria or hematochezia Appetite overall doing well still working out at the gym  Review of Systems       Physical Exam  General-in no acute distress Eyes-no discharge Lungs-respiratory rate normal, CTA CV-no murmurs,RRR Extremities skin warm dry no edema Neuro grossly normal Behavior normal, alert       Assessment & Plan:  1. Type 2 diabetes mellitus without complication, without long-term current use of insulin Patient states he ran out of his medicines.  He has noticed over the past few months increased thirst increased urination.  States he has lost some weight.  He states he is trying to eat healthy and stay physically active.  He is dramatically down on weight compared to where he was very concerning.  His glucose here in the office over 500.  We will send him for stat labs.  More than likely will need to go to the ER depending on what his kidney function, CO2 and potassium show - Glucose (CBG) - CBC with Differential - Basic Metabolic Panel - Hepatic Function Panel - Hemoglobin A1c - POCT URINALYSIS DIP (CLINITEK)  2. Type 2 diabetes mellitus with stage 3b chronic kidney disease, without long-term current use of insulin In my opinion I believe he will need to be on insulin.  Patient does not want to go on insulin.  Await the results of the lab work  3. Primary hypertension Pressure drops when he stands I believe he is probably to some degree dehydrated we will wait to see what labs are showing  recommend reducing amlodipine down to 5 mg  4. Diabetic nephropathy associated with type 2 diabetes mellitus Check kidney function await results  5. Weight loss, unintentional I believe the weight loss is from catabolism related to untreated diabetes.  Follow-up-his stat lab work came back showing significant elevation of glucose and dramatic increase of his creatinine and potassium he needs to go to the emergency department for further evaluation and treatment  Patient reluctant to go to the ER but does agree that that would be the best step.  His daughter will help him.  ER was called I did discuss case with the ER doctor.

## 2023-02-15 NOTE — ED Provider Notes (Signed)
[place] EMERGENCY DEPARTMENT AT [facility] Provider Note   CSN: [id number] Arrival date & time: 02/15/23  1343     History  Chief Complaint  Patient presents with   Hyperglycemia    Charles RidgesDavid E Medina is a 82 y.o. male with medical history of diabetes type 2, hypertension, prostate cancer, arthritis, AAA.  Patient presents to ED for evaluation of hyperglycemia. Patient reports that he was seen at his PCPs office this morning and advised to come to the ED due to his hyperglycemia.  Patient reports that his sugar was in the 500s at PCP.  Patient reports he has been without his Januvia for the last 1 month secondary to issues at his pharmacy.  Patient reports that he has had excessive urination, excessive hunger in the last 1 week.  Patient denies excessive thirst.  Patient requesting cup of water in triage.  Patient denies any fevers, nausea, vomiting, abdominal pain, chest pain, shortness of breath.  Patient alert and oriented x 3.  Patient overall nontoxic appearance.   Hyperglycemia Associated symptoms: polyuria        Home Medications Prior to Admission medications   Medication Sig Start Date End Date Taking? Authorizing Provider  amLODipine (NORVASC) 10 MG tablet Take 1 tablet (10 mg total) by mouth daily. 07/26/22   Babs SciaraLuking, Scott A, MD  atorvastatin (LIPITOR) 80 MG tablet Take 1 tablet (80 mg total) by mouth daily. 07/26/22   Babs SciaraLuking, Scott A, MD  blood glucose meter kit and supplies KIT Dispense based on patient and insurance preference. Use up to three times daily to check blood sugars 09/28/22   Babs SciaraLuking, Scott A, MD  famotidine (PEPCID) 40 MG tablet Take 1 tablet (40 mg total) by mouth at bedtime. 12/23/19   Rehman, Joline MaxcyNajeeb U, MD  glipiZIDE (GLUCOTROL) 5 MG tablet TAKE HALF A TAB DAILY - MONITOR BLOOD SUGARS 09/02/22   Babs SciaraLuking, Scott A, MD  Multiple Vitamins-Minerals (EMERGEN-C IMMUNE PO) Take 1 packet by mouth daily as needed (immune health/energy).    [provider]  sildenafil (REVATIO) 20 MG tablet TAKE 3 TO 5 TABLETS AS NEEDED AND INSTRUCTED. 10/06/22   Babs SciaraLuking, Scott A, MD  sitaGLIPtin (JANUVIA) 50 MG tablet Take 1 tablet (50 mg total) by mouth daily. Patient not taking: Reported on 02/15/2023 08/26/22   Babs SciaraLuking, Scott A, MD  sodium zirconium cyclosilicate (LOKELMA) 10 g PACK packet Take 10 g by mouth , dissolved into beverage (three) 3 times daily for 2 days  Then take 10 g by mouth dissolved into beverage once daily for 7 days 10/18/21   Babs SciaraLuking, Scott A, MD  telmisartan (MICARDIS) 20 MG tablet Take by mouth. 07/07/22 07/07/23  [provider]  traMADol (ULTRAM) 50 MG tablet Take 1 tablet (50 mg total) by mouth every 6 (six) hours as needed. 05/05/22   Persons, [place]ali[place] Anne, PA      Allergies    Patient has no known allergies.    Review of Systems   Review of Systems  Endocrine: Positive for polyphagia and polyuria.  All other systems reviewed and are negative.   Physical Exam Updated Vital Signs BP 96/76 (BP Location: Right Arm)   Pulse 84   Temp (!) 97.5 F (36.4 C)   Resp 18   Ht 5\' 11"  (1.803 m)   Wt 60.3 kg   SpO2 95%   BMI 18.55 kg/m  Physical Exam Vitals and nursing note reviewed.  Constitutional:      General: He is not  in acute distress.    Appearance: He is not ill-appearing, toxic-appearing or diaphoretic.  HENT:     Head: Normocephalic and atraumatic.     Nose: Nose normal. No congestion.     Mouth/Throat:     Mouth: Mucous membranes are moist.     Pharynx: Oropharynx is clear.  Eyes:     Extraocular Movements: Extraocular movements intact.     Conjunctiva/sclera: Conjunctivae normal.     Pupils: Pupils are equal, round, and reactive to light.  Cardiovascular:     Rate and Rhythm: Normal rate and regular rhythm.  Pulmonary:     Breath sounds: Normal breath sounds. No wheezing.  Abdominal:     General: Abdomen is flat. Bowel sounds are normal.     Palpations: Abdomen is soft.     Tenderness: There is no  abdominal tenderness.  Musculoskeletal:     Cervical back: Normal range of motion and neck supple. No tenderness.     Right lower leg: No edema.     Left lower leg: No edema.  Skin:    General: Skin is warm and dry.     Capillary Refill: Capillary refill takes less than 2 seconds.  Neurological:     Mental Status: He is alert and oriented to person, place, and time.     ED Results / Procedures / Treatments   Labs (all labs ordered are listed, but only abnormal results are displayed) Labs Reviewed  BASIC METABOLIC PANEL - Abnormal; Notable for the following components:      Result Value   Sodium 126 (*)    Potassium 5.2 (*)    Chloride 90 (*)    Glucose, Bld 626 (*)    BUN 48 (*)    Creatinine, Ser 2.70 (*)    GFR, Estimated 23 (*)    All other components within normal limits  CBC - Abnormal; Notable for the following components:   RBC 3.82 (*)    Hemoglobin 11.9 (*)    HCT 35.4 (*)    Platelets 126 (*)    All other components within normal limits  CBG MONITORING, ED - Abnormal; Notable for the following components:   Glucose-Capillary 575 (*)    All other components within normal limits  CBG MONITORING, ED - Abnormal; Notable for the following components:   Glucose-Capillary 419 (*)    All other components within normal limits  URINALYSIS, ROUTINE W REFLEX MICROSCOPIC  CBG MONITORING, ED  CBG MONITORING, ED    EKG None  Radiology No results found.  Procedures Procedures   Medications Ordered in ED Medications  sodium chloride 0.9 % bolus 1,000 mL (0 mLs Intravenous Stopped 02/15/23 1814)  insulin aspart (novoLOG) injection 10 Units (10 Units Subcutaneous Given 02/15/23 1644)  sodium zirconium cyclosilicate (LOKELMA) packet 10 g (10 g Oral Given 02/15/23 1642)    ED Course/ Medical Decision Making/ A&P  Medical Decision Making Amount and/or Complexity of Data Reviewed Labs: ordered.  Risk Prescription drug management.   82 year old male presents to ED  for evaluation.  Please see HPI for further details.  On examination patient afebrile and nontachycardic.  Lung sounds are clear bilaterally, he is not hypoxic.  His abdomen is soft and compressible throughout.  Neurological examination at baseline.  Patient nontoxic in appearance.  CBC without leukocytosis, baseline hemoglobin of 11.9.  BMP with decreased sodium to 126, elevated potassium to 5.2, glucose 626, creatinine 2.7 however this is baseline.  Anion gap 13, not elevated, patient not in  DKA. EKG nonischemic, no peaked T waves.  Patient sodium most likely due to pseudohyponatremia in the setting of hyperglycemia.  Patient potassium was lowered utilizing 10 g Lokelma.  Patient was provided 10 units of insulin subcutaneously.  Patient was provided 1 L fluid.  On reassessment patient CBG is decreased down to 419.  Patient requesting discharge.  Patient will be encouraged to continue hydrating himself at home with fluid.  The patient PCP contacted ED providers prior to the patient being sent over and advised that if patient was discharged he can follow-up in the morning with his PCP.  The patient will be advised to follow-up with PCP in the morning.  Patient was advised to return to the ED with any new or worsening signs or symptoms.  Patient and patient granddaughter at bedside voiced understanding of instructions.  Patient case discussed with attending Dr. Jeraldine Loots who voices agreement plan of management.  Patient stable for discharge.   Final Clinical Impression(s) / ED Diagnoses Final diagnoses:  Hyperglycemia    Rx / DC Orders ED Discharge Orders     None         Charles Medina 02/15/23 1840    Bethann Berkshire, MD 02/17/23 1253

## 2023-02-15 NOTE — ED Triage Notes (Signed)
Pt was at PCP for a regular check up and blood sugar read high and was advised to come to ER. Pt states she has been very thirsty and urinating a lot. Pt states he has been out of one of his diabetic medications for 1 month. Denies any pain.

## 2023-02-15 NOTE — ED Provider Triage Note (Signed)
Emergency Medicine Provider Triage Evaluation Note  Charles Medina , a 82 y.o. male  was evaluated in triage.  Pt complains of high sugars.  Patient reports that he was seen at his PCPs office this morning and advised to come to the ED due to his hyperglycemia.  Patient reports that his sugar was in the 500s at PCP.  Patient reports he has been without his Januvia for the last 1 month secondary to issues at his pharmacy.  Patient reports that he has had excessive urination, excessive hunger in the last 1 week.  Patient denies excessive thirst.  Patient requesting cup of water in triage.  Patient denies any fevers, nausea, vomiting, abdominal pain, chest pain, shortness of breath.  Patient alert and oriented x 3.  Review of Systems  Positive:  Negative:   Physical Exam  BP 96/76 (BP Location: Right Arm)   Pulse 84   Temp (!) 97.5 F (36.4 C)   Resp 18   Ht 5\' 11"  (1.803 m)   Wt 60.3 kg   SpO2 95%   BMI 18.55 kg/m  Gen:   Awake, no distress   Resp:  Normal effort  MSK:   Moves extremities without difficulty  Other:    Medical Decision Making  Medically screening exam initiated at 2:39 PM.  Appropriate orders placed.  AUTHER CHAPLA was informed that the remainder of the evaluation will be completed by another provider, this initial triage assessment does not replace that evaluation, and the importance of remaining in the ED until their evaluation is complete.     Al Decant, PA-C 02/15/23 1440

## 2023-02-15 NOTE — Discharge Instructions (Signed)
Return to the ED with any new or worsening signs or symptoms Please continue pushing fluid as an outpatient Please read attached guide concerning hyperglycemia Please follow-up with Dr. Gerda Diss in the morning Please continue monitoring blood sugars at home

## 2023-02-16 ENCOUNTER — Other Ambulatory Visit: Payer: Self-pay | Admitting: Family Medicine

## 2023-02-16 ENCOUNTER — Telehealth: Payer: Self-pay | Admitting: Family Medicine

## 2023-02-16 ENCOUNTER — Ambulatory Visit (INDEPENDENT_AMBULATORY_CARE_PROVIDER_SITE_OTHER): Payer: Medicare HMO | Admitting: Family Medicine

## 2023-02-16 ENCOUNTER — Other Ambulatory Visit: Payer: Self-pay

## 2023-02-16 VITALS — BP 119/63 | HR 78 | Wt 139.2 lb

## 2023-02-16 DIAGNOSIS — E119 Type 2 diabetes mellitus without complications: Secondary | ICD-10-CM

## 2023-02-16 DIAGNOSIS — E1121 Type 2 diabetes mellitus with diabetic nephropathy: Secondary | ICD-10-CM | POA: Diagnosis not present

## 2023-02-16 LAB — GLUCOSE, POCT (MANUAL RESULT ENTRY): POC Glucose: 575 mg/dl — AB (ref 70–99)

## 2023-02-16 MED ORDER — PEN NEEDLES 31G X 8 MM MISC
1.0000 | Freq: Every day | 2 refills | Status: AC
Start: 2023-02-16 — End: ?

## 2023-02-16 MED ORDER — LANTUS SOLOSTAR 100 UNIT/ML ~~LOC~~ SOPN: PEN_INJECTOR | SUBCUTANEOUS | 5 refills | Status: DC

## 2023-02-16 NOTE — Telephone Encounter (Signed)
We stop the glipizide stop the Januvia I sent in Lantus he will start off with 6 units Recommend checking his sugar every single morning Recommend nurse visit this afternoon to initiate his insulin 6 units to day then each evening he will give 6 units starting on Friday Then he will do a nurse visit again Monday or Tuesday and bring his readings with him and we will adjust the Lantus further  Also to important to instruct him to try to eat healthy minimize starches in the diet especially breads and potatoes and processed foods Trying to maximize vegetables and fruits and lean meats Also go ahead with diabetic counselor referral

## 2023-02-16 NOTE — Telephone Encounter (Signed)
Patient was told to call when got up to call office to let you know he is doing ok. He wants to know if he should continue current medication or were prescribing new medication.

## 2023-02-16 NOTE — Progress Notes (Signed)
   Subjective:    Patient ID: Charles Medina, male    DOB: 09-25-1941, 82 y.o.   MRN: [id number]  HPI Patient arrives to go over insulin. Very nice patient Brings his insulin in today He has lost significant amount of weight His sugars are quite elevated He was not ketotic He is doing the best he can with healthy eating but his food choices are not the best for diabetes We went over multiple choices that he could try for breakfast lunch and dinner Talked about utilizing water avoiding sugary drinks He does drink some milk In addition to this we also discussed monitoring his sugars at least once per day and that if he has low sugars below 100 to notify us He was educated what symptoms of hypoglycemia was He will follow his sugars closely and follow-up with [place] early next week May need additional measures may need consultation with endocrinology His sugar was well over 500 today He is very alert   Review of Systems     Objective:   Physical Exam  General-in no acute distress Eyes-no discharge Lungs-respiratory rate normal, CTA CV-no murmurs,RRR Extremities skin warm dry no edema Neuro grossly normal Behavior normal, alert  Nurse did do insulin education in coached him through his first injection     Assessment & Plan:  Probable type 2 diabetes but given his CKD oral regimens are no longer the best route Given the elevated glucose will go ahead with Lantus 8 units each evening will touch base with him frequently to find out what his glucose readings were he will follow-up on Monday Diabetic education as well Printouts given to the patient I did review over with him healthy choices that he could try for breakfast lunch dinner as well as liquids and snacks.

## 2023-02-16 NOTE — Telephone Encounter (Signed)
Patient coming in today 02/16/23 at 4 pm to see Dr Lorin Picket

## 2023-02-17 LAB — HEMOGLOBIN A1C
Hgb A1c MFr Bld: >15.5 % — ABNORMAL HIGH (ref 4.8–5.6)
Mean Plasma Glucose: >398 mg/dL

## 2023-02-18 ENCOUNTER — Telehealth: Payer: Self-pay | Admitting: Family Medicine

## 2023-02-18 NOTE — Telephone Encounter (Signed)
Front Patient was instructed when he came in for diabetic education to follow-up on Monday at 11 AM I realize my schedule is already full Please pend him in his visit will take too long is just to make sure he is on the right track and help with his education and diabetes Thank you so much

## 2023-02-19 NOTE — Telephone Encounter (Signed)
I did speak with the patient on Saturday.  He was utilizing 8 units I encouraged him to go ahead and go up to 10 units his morning sugar on Saturday was in the upper 300s, he has a follow-up on Monday with at 11 AM as a work in

## 2023-02-20 ENCOUNTER — Ambulatory Visit (INDEPENDENT_AMBULATORY_CARE_PROVIDER_SITE_OTHER): Payer: Medicare HMO | Admitting: Family Medicine

## 2023-02-20 ENCOUNTER — Telehealth: Payer: Self-pay

## 2023-02-20 VITALS — BP 125/71 | HR 69 | Wt 136.0 lb

## 2023-02-20 DIAGNOSIS — E1121 Type 2 diabetes mellitus with diabetic nephropathy: Secondary | ICD-10-CM

## 2023-02-20 LAB — GLUCOSE, POCT (MANUAL RESULT ENTRY): POC Glucose: 264 mg/dl — AB (ref 70–99)

## 2023-02-20 MED ORDER — INSULIN PEN NEEDLE 31G X 4 MM MISC: 6 refills | Status: AC

## 2023-02-20 NOTE — Telephone Encounter (Signed)
     Patient  visit on 02/15/2023  at [facility] was for hyperglycemia. Patient declined to participate.  Have you been able to follow up with your primary care physician?  The patient was or was not able to obtain any needed medicine or equipment.  Are there diet recommendations that you are having difficulty following?  Patient expresses understanding of discharge instructions and education provided has no other needs at this time.    Charles Medina [facility]  [facility] Population Health Community Resource Care Guide   [email or web address]  ?? [phone]   Website: [email or web address]  [email or web address]

## 2023-02-20 NOTE — Progress Notes (Signed)
   Subjective:    Patient ID: Charles Medina, male    DOB: 03-Feb-1941, 82 y.o.   MRN: [id number]  HPI Patient arrives to check sugar and evaluate medication. Denies any low spells Eating healthier Patient with diabetes Watching his diet Using the insulin Numbers are starting to come down No low sugars   Review of Systems     Objective:   Physical Exam  General-in no acute distress Eyes-no discharge Lungs-respiratory rate normal, CTA CV-no murmurs,RRR Extremities skin warm dry no edema Neuro grossly normal Behavior normal, alert       Assessment & Plan:  Glucose still elevated but is coming down to mid 250s currently Stick with 10 units each evening Will touch base with him in 48 hours Goal is to get morning sugars between 100-130 Follow-up 4 weeks

## 2023-02-22 ENCOUNTER — Telehealth: Payer: Self-pay

## 2023-02-22 NOTE — Telephone Encounter (Signed)
Called and spoke with patient his blood sugar this morning was 214. Spoke with Dr Lorin Picket and he advised patient to keep treatment the same and have patient call and update nurse/CMA Monday or Tuesday. Patient verbalized understanding.

## 2023-02-28 ENCOUNTER — Telehealth: Payer: Self-pay | Admitting: Family Medicine

## 2023-02-28 NOTE — Telephone Encounter (Signed)
He was on 10 units May go up to 11 units Send [place] readings within 1 week please

## 2023-02-28 NOTE — Telephone Encounter (Signed)
Patient brought in Blood sugar readings (4/21) 301 (4/22) 266 ( 4/23) 137

## 2023-02-28 NOTE — Telephone Encounter (Signed)
Patient brought in blood sugar readings this morning (4/21)   301 (4/22)    266 (4/23)    137

## 2023-03-01 NOTE — Telephone Encounter (Signed)
Patient advised of provider's recommendations and verbalized understanding. 

## 2023-03-08 ENCOUNTER — Other Ambulatory Visit: Payer: Self-pay | Admitting: Family Medicine

## 2023-03-08 ENCOUNTER — Telehealth: Payer: Self-pay | Admitting: Family Medicine

## 2023-03-08 NOTE — Telephone Encounter (Signed)
Patient dropped off sugar reading for you to review in your folder. He has appointment 5/14 for followup

## 2023-03-12 NOTE — Telephone Encounter (Signed)
I reviewed over the glucose readings he may go up to 12 units of insulin Please keep follow-up visits as planned on May 14 If further troubles or problems before then please notify us The goal is to see his morning sugars between 100 and 150

## 2023-03-17 NOTE — Telephone Encounter (Signed)
Patient advised per Dr Lorin Picket:  I reviewed over the glucose readings he may go up to 12 units of insulin Please keep follow-up visits as planned on May 14 If further troubles or problems before then please notify us The goal is to see his morning sugars between 100 and 15    Patient verbalized understanding.

## 2023-03-21 ENCOUNTER — Ambulatory Visit (INDEPENDENT_AMBULATORY_CARE_PROVIDER_SITE_OTHER): Payer: Medicare HMO | Admitting: Family Medicine

## 2023-03-21 VITALS — BP 128/82 | Wt 144.4 lb

## 2023-03-21 DIAGNOSIS — E1121 Type 2 diabetes mellitus with diabetic nephropathy: Secondary | ICD-10-CM

## 2023-03-21 DIAGNOSIS — Z794 Long term (current) use of insulin: Secondary | ICD-10-CM | POA: Diagnosis not present

## 2023-03-21 NOTE — Addendum Note (Signed)
Addended by: Margaretha Sheffield on: 03/21/2023 01:18 PM   Modules accepted: Orders

## 2023-03-21 NOTE — Progress Notes (Signed)
   Subjective:    Patient ID: Charles Medina, male    DOB: 30-Nov-1940, 82 y.o.   MRN: [id number]  HPI  Patient arrives for a 4 week follow up on sugars. Patient states he is doing well and currently on 12 units of insulin A couple times his sugar was borderline low He denies any major setbacks His readings overall in the low 100s but one of the readings was 82 Another reading was in the 200s after he had had a couple hotdogs Review of Systems     Objective:   Physical Exam General-in no acute distress Eyes-no discharge Lungs-respiratory rate normal, CTA CV-no murmurs,RRR Extremities skin warm dry no edema Neuro grossly normal Behavior normal, alert  Follow-up in approximately 8 weeks      Assessment & Plan:  Diabetes He would benefit from continuous glucose monitor He states he would prefer Dexcom with the monitor reading We will touch base with Vanice Sarah through a consult

## 2023-03-22 ENCOUNTER — Telehealth: Payer: Self-pay

## 2023-03-22 NOTE — Progress Notes (Signed)
   Care Guide Note  03/22/2023 Name: [name] MRN: [id number] DOB: 18-Jul-1941  Referred by: Babs Sciara, MD Reason for referral : Care Coordination (Outreach to schedule with Pharm d )   Charles Medina is a 82 y.o. year old male who is a primary care patient of Luking, Jonna Coup, MD. Clent Ridges was referred to the pharmacist for assistance related to DM.    Successful contact was made with the patient to discuss pharmacy services including being ready for the pharmacist to call at least 5 minutes before the scheduled appointment time, to have medication bottles and any blood sugar or blood pressure readings ready for review. The patient agreed to meet with the pharmacist via with the pharmacist via telephone visit on (date/time).  04/05/2023  Penne Lash, RMA Care Guide [facility]  [place], [place] [address] Direct Dial: [phone] [email or web address]

## 2023-04-05 ENCOUNTER — Other Ambulatory Visit: Payer: Medicare HMO | Admitting: Pharmacist

## 2023-04-05 ENCOUNTER — Telehealth: Payer: Self-pay | Admitting: Family Medicine

## 2023-04-05 NOTE — Progress Notes (Signed)
04/05/2023 Name: Charles Medina MRN: [id number] DOB: 1941/05/17  Type 2 Diabetes   Charles Medina is a 82 y.o. year old male who presented for a telephone visit.   They were referred to the pharmacist by their PCP for assistance in managing diabetes and continuous glucose monitoring .    Subjective:  Care Team: Primary Care Provider: Babs Sciara, MD ; Next Scheduled Visit: 05/16/23  Medication Access/Adherence  Current Pharmacy:  [facility] [place] [place] - [address] [place] [address] Phone: [phone] Fax: [phone]  [facility] - [place], [place] - [address] [place] [address] Phone: [phone] Fax: [phone]   Patient reports affordability concerns with their medications: No  Patient reports access/transportation concerns to their pharmacy: No  Patient reports adherence concerns with their medications:  No     Diabetes:  Current medications:  lantus 11 units daily Medications tried in the past: [place] (stopped due to cost)  Current glucose readings: fasting -->126, 113, 122, 79, 71, 97, 128, 115, 83, 95, 118, 127, 103, 135, 92 Using accuchek guide meter; testing 1 time daily (fasting)  -Patient is injecting insulin and would greatly benefit from a continuous glucose monitoring system (I.e. libre or dexcom)  Patient denies hypoglycemic s/sx including dizziness, shakiness, sweating. Patient reports hyperglycemic symptoms including polyuria, polydipsia, polyphagia, nocturia, neuropathy, blurred vision.  Current physical activity: works out at [employer] in [employer] [place]  Current medication access support: [employer] medicare   Objective:  Lab Results  Component Value Date   HGBA1C >15.5 (H) 02/15/2023    Lab Results  Component Value Date   CREATININE 2.70 (H) 02/15/2023   BUN 48 (H) 02/15/2023   NA 126 (L) 02/15/2023   K 5.2 (H) 02/15/2023   CL 90 (L) 02/15/2023   CO2 23  02/15/2023    Lab Results  Component Value Date   CHOL 128 07/26/2022   HDL 36 (L) 07/26/2022   LDLCALC 78 07/26/2022   TRIG 66 07/26/2022   CHOLHDL 3.6 07/26/2022    Medications Reviewed Today     Reviewed by Charles Sheffield, RN (Registered Nurse) on 03/21/23 at 1123  Med List Status: <None>   Medication Order Taking? Sig Documenting Provider Last Dose Status Informant  ACCU-CHEK GUIDE test strip [id number]  USE AS DIRECTED TO TEST BLOOD SUGAR 3 TIMES DAILY. Charles Sciara, MD  Active   amLODipine (NORVASC) 10 MG tablet [id number] No Take 1 tablet (10 mg total) by mouth daily. Charles Sciara, MD Taking Active   atorvastatin (LIPITOR) 80 MG tablet [id number] No Take 1 tablet (80 mg total) by mouth daily. Charles Sciara, MD Taking Active   blood glucose meter kit and supplies KIT [id number] No Dispense based on patient and insurance preference. Use up to three times daily to check blood sugars Charles Medina, Charles Coup, MD Taking Active   insulin glargine (LANTUS SOLOSTAR) 100 UNIT/ML Solostar Pen [id number] No 6 units each evening may titrate up to 20 units as directed by physician Charles Sciara, MD Taking Active            Med Note Charles Medina   Tue Mar 21, 2023 11:23 AM) 12 units  Insulin Pen Needle (PEN NEEDLES) 31G X 8 MM MISC [id number] No 1 each by Does not apply route daily. Charles Sciara, MD Taking Active   Insulin Pen Needle 31G X 4 MM MISC [id number]  Patient on  long-acting insulin once per day for diabetes Charles Sciara, MD  Active   Multiple Vitamins-Minerals (EMERGEN-C IMMUNE PO) [id number] No Take 1 packet by mouth daily as needed (immune health/energy).  Patient not taking: Reported on 02/20/2023   [provider] Not Taking Active Self  sildenafil (REVATIO) 20 MG tablet [id number] No TAKE 3 TO 5 TABLETS AS NEEDED AND INSTRUCTED. Charles Sciara, MD Taking Active   telmisartan (MICARDIS) 20 MG tablet [id number] No Take by mouth. [provider] Taking Active    traMADol (ULTRAM) 50 MG tablet [id number] No Take 1 tablet (50 mg total) by mouth every 6 (six) hours as needed. Persons, Charles Medina, [place] Taking Active               Assessment/Plan:   Diabetes: - Currently uncontrolled --blood sugars improving per patient report  Would like to start GLP1 but access/support is limited (only PAP available ships to office)  Reinforced compliance of all medications especially insulin  Libre 3 CGM called into [facility] to explore coverage/copay (patient amenable to CGM)  May have to add meal time insulin/another agent (I.e GLP1)   Blood pressure controlled continue amlodipine/telmisartan - Reviewed long term cardiovascular and renal outcomes of uncontrolled blood sugar - Reviewed goal A1c, goal fasting, and goal 2 hour post prandial glucose - Reviewed dietary modifications including FOLLOWING A HEART HEALTHY DIET/HEALTHY PLATE METHOD - Reviewed lifestyle modifications including: continue to workout at workout 3-4 times weekly as able - Recommend to check glucose daily (fasting) or if symptomatic   Follow Up Plan: 05/10/23 with PharmD; PCP on 05/16/23    Charles Medina, PharmD, BCACP Clinical Pharmacist, [facility]

## 2023-04-05 NOTE — Telephone Encounter (Signed)
Patient dropped out sugar reading this morning in your yellow folder.

## 2023-04-09 NOTE — Telephone Encounter (Signed)
Nurses-I believe the patient is currently doing 11 units.  His most recent glucoses the past several days looks good.  Should he start having low sugars below 85 he needs to let us know otherwise keep follow-up visit in July he can send [place] updates on his readings in 2 to 3 weeks thank you

## 2023-04-10 NOTE — Telephone Encounter (Signed)
He may continue 12 units but if he starts getting readings below 90 I would recommend that he reduce it to 11 units He can send [place] an update within 2 to 3 weeks time  Please update epic med list to reflect that he is utilizing 12 units each evening thank you

## 2023-04-10 NOTE — Telephone Encounter (Signed)
Med list stated patient is currently doing 12 units of insuline daily- patient was increased with last set of readings sent in

## 2023-04-10 NOTE — Telephone Encounter (Signed)
Patient advised per Dr Lorin Picket:  He may continue 12 units but if he starts getting readings below 90 I would recommend that he reduce it to 11 units He can send [place] an update within 2 to 3 weeks time    Patient verbalized understanding.

## 2023-04-12 ENCOUNTER — Other Ambulatory Visit: Payer: Self-pay | Admitting: Family Medicine

## 2023-04-17 DIAGNOSIS — E119 Type 2 diabetes mellitus without complications: Secondary | ICD-10-CM | POA: Diagnosis not present

## 2023-04-25 DIAGNOSIS — E119 Type 2 diabetes mellitus without complications: Secondary | ICD-10-CM | POA: Diagnosis not present

## 2023-04-25 DIAGNOSIS — H18413 Arcus senilis, bilateral: Secondary | ICD-10-CM | POA: Diagnosis not present

## 2023-04-25 DIAGNOSIS — H25813 Combined forms of age-related cataract, bilateral: Secondary | ICD-10-CM | POA: Diagnosis not present

## 2023-04-25 DIAGNOSIS — H35372 Puckering of macula, left eye: Secondary | ICD-10-CM | POA: Diagnosis not present

## 2023-04-25 LAB — HM DIABETES EYE EXAM

## 2023-04-26 ENCOUNTER — Encounter: Payer: Self-pay | Admitting: Pharmacist

## 2023-04-26 MED ORDER — FREESTYLE LIBRE 3 SENSOR MISC: 5 refills | Status: AC

## 2023-04-26 MED ORDER — FREESTYLE LIBRE 3 READER DEVI: 1.0000 | 1 refills | Status: AC

## 2023-05-03 ENCOUNTER — Other Ambulatory Visit: Payer: Self-pay | Admitting: Family Medicine

## 2023-05-04 ENCOUNTER — Encounter: Payer: Self-pay | Admitting: *Deleted

## 2023-05-05 ENCOUNTER — Telehealth: Payer: Self-pay

## 2023-05-05 NOTE — Telephone Encounter (Signed)
Charles Medina dropped his blood pressure reading and also said when he wakes up in the morning if his blood pressure below 70 then his fingers become numb said its not all the time. Also said the doctor put him on 11 unites of diabetic meds.  Pt call back [phone]

## 2023-05-07 NOTE — Telephone Encounter (Signed)
Nurses-please talk with Onalee Hua.  I reviewed over his glucoses some of his readings are too low.  Please verify he is he utilizing 11 units each evening?  If so he needs to reduce this down to 10 units Our goal is to have morning sugars between 90 and 125 If the readings are below 90 that is a sign he is on too much insulin.  He has a follow-up visit with [place] in approximately a week he can bring more readings at that time Please act on the following/please call the patient regarding the above Please document

## 2023-05-08 NOTE — Telephone Encounter (Signed)
Spoke with the patient and he states he is currently injecting 11 units each evening. Informed per the following ; pt verbalized understanding  please talk with Onalee Hua.  I reviewed over his glucoses some of his readings are too low.  Please verify he is he utilizing 11 units each evening?  If so he needs to reduce this down to 10 units Our goal is to have morning sugars between 90 and 125 If the readings are below 90 that is a sign he is on too much insulin.  He has a follow-up visit with [place] in approximately a week he can bring more readings at that time

## 2023-05-10 ENCOUNTER — Other Ambulatory Visit: Payer: Medicare HMO | Admitting: Pharmacist

## 2023-05-10 ENCOUNTER — Encounter: Payer: Self-pay | Admitting: Pharmacist

## 2023-05-10 NOTE — Progress Notes (Signed)
05/10/2023 Name: Charles Medina MRN: [id number] DOB: 1941-05-27  Type 2 Diabetes   Charles Medina is a 82 y.o. year old male who presented for a telephone visit.   They were referred to the pharmacist by their PCP for assistance in managing diabetes.    Subjective:  Care Team: Primary Care Provider: Babs Sciara, MD ; Next Scheduled Visit: 05/16/23   Medication Access/Adherence  Current Pharmacy:  [facility] [place] - [address] [place] [address] Phone: [phone] Fax: [phone]  [facility] - [place], [place] - [address] [place] [address] Phone: [phone] Fax: [phone]   Patient reports affordability concerns with their medications: No  Patient reports access/transportation concerns to their pharmacy: No  Patient reports adherence concerns with their medications:  No     Diabetes:   Current medications:  lantus 11 units daily (he needs to drop down to 8-10 units due to FBG in the 70s; instructed by PCP as well) Medications tried in the past: [place] (stopped due to cost)   Current glucose readings: fasting -->126, 113, 122, 79, 71, 97, 128, 115, 83, 95, 118, 127, 103, 135, 92 Using accuchek guide meter; testing 1 time daily (fasting) Has libre 3 now, but has not set up   -Patient is injecting insulin and would greatly benefit from a continuous glucose monitoring system. His CGM was approved and he picked up from [facility] 05/05/23 ($25/month for 2 sensors; reader was $13)   Patient denies hypoglycemic s/sx including dizziness, shakiness, sweating. Patient reports hyperglycemic symptoms including polyuria, polydipsia, polyphagia, nocturia, neuropathy, blurred vision.   Current physical activity: works out at [employer] in [employer] [place]   Current medication access support: [employer] medicare   Objective:  Lab Results  Component Value Date   HGBA1C >15.5 (H)  02/15/2023    Lab Results  Component Value Date   CREATININE 2.70 (H) 02/15/2023   BUN 48 (H) 02/15/2023   NA 126 (L) 02/15/2023   K 5.2 (H) 02/15/2023   CL 90 (L) 02/15/2023   CO2 23 02/15/2023    Lab Results  Component Value Date   CHOL 128 07/26/2022   HDL 36 (L) 07/26/2022   LDLCALC 78 07/26/2022   TRIG 66 07/26/2022   CHOLHDL 3.6 07/26/2022    Medications Reviewed Today     Reviewed by Danella Maiers, [facility] (Pharmacist) on 04/05/23 at 1313  Med List Status: <None>   Medication Order Taking? Sig Documenting Provider Last Dose Status Informant  ACCU-CHEK GUIDE test strip [id number]  USE AS DIRECTED TO TEST BLOOD SUGAR 3 TIMES DAILY. Babs Sciara, MD  Active   amLODipine (NORVASC) 10 MG tablet [id number] No Take 1 tablet (10 mg total) by mouth daily. Babs Sciara, MD Taking Active   atorvastatin (LIPITOR) 80 MG tablet [id number] No Take 1 tablet (80 mg total) by mouth daily. Babs Sciara, MD Taking Active   blood glucose meter kit and supplies KIT [id number] No Dispense based on patient and insurance preference. Use up to three times daily to check blood sugars Luking, Jonna Coup, MD Taking Active   insulin glargine (LANTUS SOLOSTAR) 100 UNIT/ML Solostar Pen [id number] No 6 units each evening may titrate up to 20 units as directed by physician Babs Sciara, MD Taking Active            Med Note Margaretha Sheffield   Tue Mar 21, 2023 11:23  AM) 12 units  Insulin Pen Needle (PEN NEEDLES) 31G X 8 MM MISC [id number] No 1 each by Does not apply route daily. Babs Sciara, MD Taking Active   Insulin Pen Needle 31G X 4 MM MISC [id number]  Patient on long-acting insulin once per day for diabetes Babs Sciara, MD  Active   Multiple Vitamins-Minerals (EMERGEN-C IMMUNE PO) [id number] No Take 1 packet by mouth daily as needed (immune health/energy).  Patient not taking: Reported on 02/20/2023   [provider] Not Taking Active Self  sildenafil (REVATIO) 20 MG tablet [id number] No  TAKE 3 TO 5 TABLETS AS NEEDED AND INSTRUCTED. Babs Sciara, MD Taking Active   telmisartan (MICARDIS) 20 MG tablet [id number] No Take by mouth. [provider] Taking Active   traMADol (ULTRAM) 50 MG tablet [id number] No Take 1 tablet (50 mg total) by mouth every 6 (six) hours as needed. Persons, [place], [place] Taking Active               Assessment/Plan:  Diabetes: - Currently uncontrolled --blood sugars improving per patient report             Consider GLP1 but access/support is limited (only PAP available ships to office)             Reinforced compliance of all medications especially insulin             patient has libre 3 CGM now at home; encouraged patient to have family help set up (if unable, I can meet patient in the office)             May have to add meal time insulin/another agent (i.e GLP1) if A1c not at goal                   Blood pressure controlled continue amlodipine/telmisartan - Reviewed long term cardiovascular and renal outcomes of uncontrolled blood sugar - Reviewed goal A1c, goal fasting, and goal 2 hour post prandial glucose - Reviewed dietary modifications including FOLLOWING A HEART HEALTHY DIET/HEALTHY PLATE METHOD - Reviewed lifestyle modifications including: continue to workout at workout 3-4 times weekly as able - Recommend to check glucose daily (fasting) or if symptomatic  Follow Up Plan: PCP 05/16/23  Kieth Brightly, PharmD, BCACP Clinical Pharmacist, [facility]

## 2023-05-16 ENCOUNTER — Ambulatory Visit (INDEPENDENT_AMBULATORY_CARE_PROVIDER_SITE_OTHER): Payer: Medicare HMO | Admitting: Family Medicine

## 2023-05-16 ENCOUNTER — Ambulatory Visit: Payer: Medicare HMO | Admitting: Family Medicine

## 2023-05-16 VITALS — BP 134/76 | HR 74 | Wt 149.8 lb

## 2023-05-16 DIAGNOSIS — I10 Essential (primary) hypertension: Secondary | ICD-10-CM

## 2023-05-16 DIAGNOSIS — D696 Thrombocytopenia, unspecified: Secondary | ICD-10-CM

## 2023-05-16 DIAGNOSIS — E785 Hyperlipidemia, unspecified: Secondary | ICD-10-CM | POA: Diagnosis not present

## 2023-05-16 DIAGNOSIS — Z794 Long term (current) use of insulin: Secondary | ICD-10-CM | POA: Diagnosis not present

## 2023-05-16 DIAGNOSIS — I714 Abdominal aortic aneurysm, without rupture, unspecified: Secondary | ICD-10-CM | POA: Diagnosis not present

## 2023-05-16 DIAGNOSIS — E1122 Type 2 diabetes mellitus with diabetic chronic kidney disease: Secondary | ICD-10-CM

## 2023-05-16 DIAGNOSIS — E1169 Type 2 diabetes mellitus with other specified complication: Secondary | ICD-10-CM | POA: Diagnosis not present

## 2023-05-16 DIAGNOSIS — N1831 Chronic kidney disease, stage 3a: Secondary | ICD-10-CM | POA: Diagnosis not present

## 2023-05-16 MED ORDER — ATORVASTATIN CALCIUM 80 MG PO TABS: 80.0000 mg | ORAL_TABLET | Freq: Every day | ORAL | 1 refills | Status: DC

## 2023-05-16 MED ORDER — TELMISARTAN 20 MG PO TABS: 20.0000 mg | ORAL_TABLET | Freq: Every day | ORAL | 1 refills | Status: DC

## 2023-05-16 MED ORDER — AMLODIPINE BESYLATE 10 MG PO TABS: 10.0000 mg | ORAL_TABLET | Freq: Every day | ORAL | 1 refills | Status: DC

## 2023-05-16 NOTE — Progress Notes (Signed)
   Subjective:    Patient ID: Charles Medina, male    DOB: 11/26/1940, 82 y.o.   MRN: [id number]  HPI Patient arrives for 8 week follow up.   Patient for blood pressure check up.  The patient does have hypertension.   Patient relates dietary measures try to minimize salt The importance of healthy diet and activity were discussed Patient relates compliance  Patient here for follow-up regarding cholesterol.    Patient relates taking medication on a regular basis Denies problems with medication Importance of dietary measures discussed Regular lab work regarding lipid and liver was checked and if needing additional labs was appropriately ordered  The patient was seen today as part of a comprehensive diabetic check up. Patient has diabetes Patient relates good compliance with taking the medication. We discussed their diet and exercise activities  We also discussed the importance of notifying us if any excessively high glucoses or low sugars.     Review of Systems     Objective:   Physical Exam  General-in no acute distress Eyes-no discharge Lungs-respiratory rate normal, CTA CV-no murmurs,RRR Extremities skin warm dry no edema Neuro grossly normal Behavior normal, alert  Diabetic foot exam completed today     Assessment & Plan:   1. Type 2 diabetes mellitus with stage 3a chronic kidney disease, with long-term current use of insulin (HCC) Diabetes overall good control check A1c will lower the insulin down to 9 units to avoid hypoglycemia healthy diet recommended - Hemoglobin A1c - Microalbumin/Creatinine Ratio, Urine  2. Hyperlipidemia associated with type 2 diabetes mellitus (HCC) Continue statin healthy diet check labs - Lipid panel  3. Primary hypertension Blood pressure good control continue current measures - Basic Metabolic Panel (7)  4. Abdominal aortic aneurysm (AAA) without rupture, unspecified part (HCC) Has had this repaired in the past being followed by  vascular surgery we will reach out to them to see if they recommend [place] doing follow-up imaging or may recommend a follow-up office visit for him  5. Thrombocytopenia (HCC) Recent platelets back in April were good at 126 low but not terribly low We will do a follow-up visit later this year

## 2023-05-17 ENCOUNTER — Encounter: Payer: Self-pay | Admitting: *Deleted

## 2023-05-21 ENCOUNTER — Telehealth: Payer: Self-pay | Admitting: Family Medicine

## 2023-05-21 DIAGNOSIS — I6529 Occlusion and stenosis of unspecified carotid artery: Secondary | ICD-10-CM

## 2023-05-21 NOTE — Telephone Encounter (Signed)
Patient has been seen by vascular surgery in the past Please go ahead with consultation for follow-up Please let the patient know that we are is getting the ball rolling on this and we did communicate with vascular specialist and they recommended follow-up they will more than likely will do follow-up ultrasound imaging is on his carotid thank you Please see message below  Rhyne, Ames Coupe, PA-C  Sheba Whaling, Jonna Coup, MD It looks like he had ICA stenosis 1-39% bilaterally.  Maybe we get one more carotid duplex and if still ok, he may not need further imaging.  He should've followed up with [place] for EVAR last year.  I can send the office a message to have him f/u in our office with duplex for EVAR and carotid.  Thanks for reaching out!  Lelon Mast

## 2023-05-22 ENCOUNTER — Other Ambulatory Visit: Payer: Self-pay

## 2023-05-22 DIAGNOSIS — I714 Abdominal aortic aneurysm, without rupture, unspecified: Secondary | ICD-10-CM

## 2023-05-23 NOTE — Addendum Note (Signed)
Addended by: Margaretha Sheffield on: 05/23/2023 04:38 PM   Modules accepted: Orders

## 2023-05-24 ENCOUNTER — Other Ambulatory Visit: Payer: Self-pay | Admitting: *Deleted

## 2023-05-24 DIAGNOSIS — Z9889 Other specified postprocedural states: Secondary | ICD-10-CM

## 2023-05-24 DIAGNOSIS — R0989 Other specified symptoms and signs involving the circulatory and respiratory systems: Secondary | ICD-10-CM

## 2023-05-24 NOTE — Telephone Encounter (Signed)
Referral ordered in EPIC. Patient notified. 

## 2023-05-30 DIAGNOSIS — I10 Essential (primary) hypertension: Secondary | ICD-10-CM | POA: Diagnosis not present

## 2023-05-30 DIAGNOSIS — E1122 Type 2 diabetes mellitus with diabetic chronic kidney disease: Secondary | ICD-10-CM | POA: Diagnosis not present

## 2023-05-30 DIAGNOSIS — E1169 Type 2 diabetes mellitus with other specified complication: Secondary | ICD-10-CM | POA: Diagnosis not present

## 2023-05-30 DIAGNOSIS — Z794 Long term (current) use of insulin: Secondary | ICD-10-CM | POA: Diagnosis not present

## 2023-05-30 DIAGNOSIS — E785 Hyperlipidemia, unspecified: Secondary | ICD-10-CM | POA: Diagnosis not present

## 2023-05-30 DIAGNOSIS — N1831 Chronic kidney disease, stage 3a: Secondary | ICD-10-CM | POA: Diagnosis not present

## 2023-05-31 ENCOUNTER — Other Ambulatory Visit: Payer: Medicare HMO | Admitting: Pharmacist

## 2023-05-31 ENCOUNTER — Encounter: Payer: Self-pay | Admitting: Family Medicine

## 2023-05-31 LAB — LIPID PANEL
Chol/HDL Ratio: 2.9 ratio (ref 0.0–5.0)
Cholesterol, Total: 114 mg/dL (ref 100–199)
HDL: 39 mg/dL — ABNORMAL LOW (ref >39)
LDL Chol Calc (NIH): 56 mg/dL (ref 0–99)
Triglycerides: 101 mg/dL (ref 0–149)
VLDL Cholesterol Cal: 19 mg/dL (ref 5–40)

## 2023-05-31 LAB — BASIC METABOLIC PANEL (7)
BUN/Creatinine Ratio: 17 (ref 10–24)
BUN: 32 mg/dL — ABNORMAL HIGH (ref 8–27)
CO2: 23 mmol/L (ref 20–29)
Chloride: 104 mmol/L (ref 96–106)
Creatinine, Ser: 1.84 mg/dL — ABNORMAL HIGH (ref 0.76–1.27)
Glucose: 186 mg/dL — ABNORMAL HIGH (ref 70–99)
Potassium: 4.6 mmol/L (ref 3.5–5.2)
Sodium: 140 mmol/L (ref 134–144)
eGFR: 36 mL/min/{1.73_m2} — ABNORMAL LOW (ref >59)

## 2023-05-31 LAB — MICROALBUMIN / CREATININE URINE RATIO
Creatinine, Urine: 243.2 mg/dL
Microalb/Creat Ratio: 39 mg/g creat — ABNORMAL HIGH (ref 0–29)
Microalbumin, Urine: 94.4 ug/mL

## 2023-05-31 LAB — HEMOGLOBIN A1C
Est. average glucose Bld gHb Est-mCnc: 169 mg/dL
Hgb A1c MFr Bld: 7.5 % — ABNORMAL HIGH (ref 4.8–5.6)

## 2023-05-31 NOTE — Progress Notes (Signed)
Please mail to patient

## 2023-06-01 ENCOUNTER — Other Ambulatory Visit: Payer: Self-pay

## 2023-06-01 DIAGNOSIS — N1831 Chronic kidney disease, stage 3a: Secondary | ICD-10-CM

## 2023-06-01 DIAGNOSIS — E1169 Type 2 diabetes mellitus with other specified complication: Secondary | ICD-10-CM

## 2023-06-01 DIAGNOSIS — I10 Essential (primary) hypertension: Secondary | ICD-10-CM

## 2023-06-08 ENCOUNTER — Telehealth: Payer: Self-pay

## 2023-06-08 NOTE — Telephone Encounter (Signed)
Pt dropped off blood sugar reading placed in Dr Lorin Picket folder. Pt is saying his fingers are numb and tingle

## 2023-06-09 DIAGNOSIS — H2512 Age-related nuclear cataract, left eye: Secondary | ICD-10-CM | POA: Diagnosis not present

## 2023-06-13 ENCOUNTER — Encounter (HOSPITAL_COMMUNITY)
Admission: RE | Admit: 2023-06-13 | Discharge: 2023-06-13 | Disposition: A | Discharge status: Home / Self Care | Payer: Medicare HMO | Source: Ambulatory Visit | Attending: Optometry | Admitting: Optometry

## 2023-06-14 ENCOUNTER — Ambulatory Visit (HOSPITAL_COMMUNITY): Payer: Medicare HMO

## 2023-06-14 ENCOUNTER — Ambulatory Visit (HOSPITAL_COMMUNITY): Payer: Medicare HMO | Attending: Vascular Surgery

## 2023-06-15 NOTE — H&P (Signed)
Surgical History & Physical  Patient Name: Charles Medina  DOB: [birth year]  Surgery: Cataract extraction with intraocular lens implant phacoemulsification; Left Eye Surgeon: Pecolia Ades MD Surgery Date: 06/16/2023 Pre-Op Date: 04/25/2023  HPI: A 76 Yr. old male patient present for cataract eval per Dr. Charise Killian. 1. The patient complains of difficulty when recognizing people, glare problems, which began 2-3 months ago. The left eye is affected. The episode is constant. The condition's severity is worsening. His BS was elevated 2-3 months ago, increased to 300 x1 day. He started insulin about 1 month ago. BS 87 this morning, unsure A1C.  Medical History: Cataracts  Cancer Diabetes High Blood Pressure LDL  Review of Systems Cardiovascular High Blood Pressure Endocrine diabetic All recorded systems are negative except as noted above.  Social Former smoker   Medication Amlodipine, Telmisartan, Atorvastatin, Insulin 11 units, unsure which kind   Sx/Procedures Prostate CA surgery  Drug Allergies  NKDA  History & Physical: Heent: cataracts NECK: supple without bruits LUNGS: lungs clear to auscultation CV: regular rate and rhythm Abdomen: soft and non-tender  Impression & Plan: Assessment: 1.  CATARACT AGE-RELATED COMBINED FORMS; Both Eyes (H25.813) 2.  Hyperopia ; Both Eyes (H52.03) 3.  Diabetes Type 2 No retinopathy (E11.9) 4.  ARCUS SENILIS; Both Eyes (H18.413) 5.  Epiretinal Membrane; Left Eye (H35.372)  Plan: 1.  Cataracts are visually significant and account for the patient's complaints. Discussed all risks, benefits, procedures and recovery, including infection, loss of vision and eye, need for glasses after surgery or additional procedures. Patient understands changing glasses will not improve vision. Patient indicated understanding of procedure. All questions answered. Patient desires to have surgery, recommend phacoemulsification with intraocular lens. Patient to have  preliminary testing necessary (Argos/IOL Master, Mac OCT, TOPO) Educational materials provided:Cataract.  Plan: - Proceed with cataract surgery OS, then OD - SY60WF with best distance correction - DM without DR, does have ERM OS - Ok with lying flat, no prior eye surgery, no fuchs  2.  Per Dr. Charise Killian  3.  Reports DMT2 for 18 years. Unsure of last A1c Stressed importance of blood sugar and blood pressure control, and also yearly eye examinations. Discussed the need for ongoing proactive ocular exams and treatment, hopefully before visual symptoms develop. Diabetic correspondence sent to PCP/endocrinologist today and/or within the past year.  4.  Monitor, not visually significant  5.  Discussed diagnosis in detail with patient. Discussed treatment options with patient including vitrectomy. No treatment is required at this time. Will continue to observe condition and or symptoms.

## 2023-06-15 NOTE — Telephone Encounter (Signed)
Patient currently getting cataract surgery You may call him early next week More than likely tingling in the hands is related to possible carpal tunnel If it is not improving over the next couple weeks I recommend an office visit His glucose readings look good if his readings go lower I would recommend reducing insulin from 9 units down to 8 units otherwise may maintain as is Keep regular follow-ups

## 2023-06-16 ENCOUNTER — Encounter (HOSPITAL_COMMUNITY): Payer: Self-pay | Admitting: Optometry

## 2023-06-16 ENCOUNTER — Ambulatory Visit (HOSPITAL_COMMUNITY)
Admission: RE | Admit: 2023-06-16 | Discharge: 2023-06-16 | Disposition: A | Discharge status: Home / Self Care | Payer: Medicare HMO | Attending: Optometry | Admitting: Optometry

## 2023-06-16 ENCOUNTER — Ambulatory Visit (HOSPITAL_COMMUNITY): Payer: Medicare HMO | Admitting: Anesthesiology

## 2023-06-16 ENCOUNTER — Encounter (HOSPITAL_COMMUNITY)
Admission: RE | Disposition: A | Discharge status: Home / Self Care | Payer: Self-pay | Source: Home / Self Care | Attending: Optometry

## 2023-06-16 ENCOUNTER — Other Ambulatory Visit: Payer: Self-pay

## 2023-06-16 ENCOUNTER — Ambulatory Visit (HOSPITAL_BASED_OUTPATIENT_CLINIC_OR_DEPARTMENT_OTHER): Payer: Medicare HMO | Admitting: Anesthesiology

## 2023-06-16 DIAGNOSIS — N1831 Chronic kidney disease, stage 3a: Secondary | ICD-10-CM

## 2023-06-16 DIAGNOSIS — I129 Hypertensive chronic kidney disease with stage 1 through stage 4 chronic kidney disease, or unspecified chronic kidney disease: Secondary | ICD-10-CM | POA: Diagnosis not present

## 2023-06-16 DIAGNOSIS — I10 Essential (primary) hypertension: Secondary | ICD-10-CM | POA: Diagnosis not present

## 2023-06-16 DIAGNOSIS — H5203 Hypermetropia, bilateral: Secondary | ICD-10-CM | POA: Diagnosis not present

## 2023-06-16 DIAGNOSIS — E1136 Type 2 diabetes mellitus with diabetic cataract: Secondary | ICD-10-CM

## 2023-06-16 DIAGNOSIS — Z794 Long term (current) use of insulin: Secondary | ICD-10-CM | POA: Diagnosis not present

## 2023-06-16 DIAGNOSIS — H35372 Puckering of macula, left eye: Secondary | ICD-10-CM | POA: Diagnosis not present

## 2023-06-16 DIAGNOSIS — H2512 Age-related nuclear cataract, left eye: Secondary | ICD-10-CM | POA: Insufficient documentation

## 2023-06-16 DIAGNOSIS — E1151 Type 2 diabetes mellitus with diabetic peripheral angiopathy without gangrene: Secondary | ICD-10-CM | POA: Diagnosis not present

## 2023-06-16 DIAGNOSIS — H25812 Combined forms of age-related cataract, left eye: Secondary | ICD-10-CM

## 2023-06-16 DIAGNOSIS — H18413 Arcus senilis, bilateral: Secondary | ICD-10-CM | POA: Diagnosis not present

## 2023-06-16 DIAGNOSIS — Z87891 Personal history of nicotine dependence: Secondary | ICD-10-CM | POA: Diagnosis not present

## 2023-06-16 HISTORY — PX: CATARACT EXTRACTION W/PHACO: SHX586

## 2023-06-16 LAB — GLUCOSE, CAPILLARY: Glucose-Capillary: 105 mg/dL — ABNORMAL HIGH (ref 70–99)

## 2023-06-16 SURGERY — PHACOEMULSIFICATION, CATARACT, WITH IOL INSERTION
Anesthesia: Monitor Anesthesia Care | Site: Eye | Laterality: Left

## 2023-06-16 MED ORDER — SIGHTPATH DOSE#1 NA HYALUR & NA CHOND-NA HYALUR IO KIT
PACK | INTRAOCULAR | Status: DC | PRN
  Administered 2023-06-16: 1 via OPHTHALMIC

## 2023-06-16 MED ORDER — STERILE WATER FOR IRRIGATION IR SOLN
Status: DC | PRN
  Administered 2023-06-16: 1

## 2023-06-16 MED ORDER — FENTANYL CITRATE (PF) 100 MCG/2ML IJ SOLN
INTRAMUSCULAR | Status: AC
  Filled 2023-06-16: qty 2

## 2023-06-16 MED ORDER — POVIDONE-IODINE 5 % OP SOLN
OPHTHALMIC | Status: DC | PRN
  Administered 2023-06-16: 1 via OPHTHALMIC

## 2023-06-16 MED ORDER — PHENYLEPHRINE-KETOROLAC 1-0.3 % IO SOLN
INTRAOCULAR | Status: DC | PRN
  Administered 2023-06-16: 500 mL via OPHTHALMIC

## 2023-06-16 MED ORDER — NEOMYCIN-POLYMYXIN-DEXAMETH 3.5-10000-0.1 OP SUSP
OPHTHALMIC | Status: DC | PRN
  Administered 2023-06-16: 2 [drp] via OPHTHALMIC

## 2023-06-16 MED ORDER — LIDOCAINE HCL 3.5 % OP GEL
1.0000 | Freq: Once | OPHTHALMIC | Status: AC
  Administered 2023-06-16: 1 via OPHTHALMIC

## 2023-06-16 MED ORDER — TROPICAMIDE 1 % OP SOLN
1.0000 [drp] | OPHTHALMIC | Status: AC | PRN
  Administered 2023-06-16 (×3): 1 [drp] via OPHTHALMIC

## 2023-06-16 MED ORDER — BSS IO SOLN
INTRAOCULAR | Status: DC | PRN
  Administered 2023-06-16: 15 mL via INTRAOCULAR

## 2023-06-16 MED ORDER — PHENYLEPHRINE HCL 2.5 % OP SOLN
1.0000 [drp] | OPHTHALMIC | Status: AC | PRN
  Administered 2023-06-16 (×3): 1 [drp] via OPHTHALMIC

## 2023-06-16 MED ORDER — TETRACAINE HCL 0.5 % OP SOLN
1.0000 [drp] | OPHTHALMIC | Status: AC | PRN
  Administered 2023-06-16 (×3): 1 [drp] via OPHTHALMIC

## 2023-06-16 MED ORDER — LIDOCAINE HCL (PF) 1 % IJ SOLN
INTRAMUSCULAR | Status: DC | PRN
  Administered 2023-06-16: 1 mL

## 2023-06-16 SURGICAL SUPPLY — 15 items
CATARACT SUITE SIGHTPATH (MISCELLANEOUS) ×1
CLOTH BEACON ORANGE TIMEOUT ST (SAFETY) ×1 IMPLANT
DRSG TEGADERM 4X4.75 (GAUZE/BANDAGES/DRESSINGS) ×1 IMPLANT
EYE SHIELD UNIVERSAL CLEAR (GAUZE/BANDAGES/DRESSINGS) IMPLANT
FEE CATARACT SUITE SIGHTPATH (MISCELLANEOUS) ×1 IMPLANT
GLOVE BIOGEL PI IND STRL 7.0 (GLOVE) ×2 IMPLANT
LENS CLAREON WAGON WHEEL 22.5 (Intraocular Lens) ×1 IMPLANT
LENS IOL CLRN WGN WHL 22.5 (Intraocular Lens) IMPLANT
NDL HYPO 18GX1.5 BLUNT FILL (NEEDLE) ×1 IMPLANT
NEEDLE HYPO 18GX1.5 BLUNT FILL (NEEDLE) ×1
PAD ARMBOARD 7.5X6 YLW CONV (MISCELLANEOUS) ×1 IMPLANT
POSITIONER HEAD 8X9X4 ADT (SOFTGOODS) ×1 IMPLANT
SYR TB 1ML LL NO SAFETY (SYRINGE) ×1 IMPLANT
TAPE SURG TRANSPORE 1 IN (GAUZE/BANDAGES/DRESSINGS) IMPLANT
WATER STERILE IRR 250ML POUR (IV SOLUTION) ×1 IMPLANT

## 2023-06-16 NOTE — Anesthesia Postprocedure Evaluation (Signed)
Anesthesia Post Note  Patient: Charles Medina  Procedure(s) Performed: CATARACT EXTRACTION PHACO AND INTRAOCULAR LENS PLACEMENT (IOC) (Left: Eye)  Patient location during evaluation: Phase II Anesthesia Type: MAC Level of consciousness: awake and alert and oriented Pain management: pain level controlled Vital Signs Assessment: post-procedure vital signs reviewed and stable Respiratory status: spontaneous breathing, nonlabored ventilation and respiratory function stable Cardiovascular status: stable and blood pressure returned to baseline Postop Assessment: no apparent nausea or vomiting Anesthetic complications: no  No notable events documented.   Last Vitals:  Vitals:   06/16/23 1041 06/16/23 1302  BP: (!) 150/65 (!) 158/73  Pulse: (!) 52 76  Resp: 16 16  Temp: 36.9 C 36.9 C  SpO2: 100% 100%    Last Pain:  Vitals:   06/16/23 1302  TempSrc: Oral  PainSc: 0-No pain                  C 

## 2023-06-16 NOTE — Interval H&P Note (Signed)
History and Physical Interval Note:  06/16/2023 11:31 AM  The H and P was reviewed and updated. The patient was examined.  No changes were found after exam.  The surgical eye was marked.   

## 2023-06-16 NOTE — Anesthesia Procedure Notes (Signed)
Date/Time: 06/16/2023 12:37 PM  Performed by: Franco Nones, CRNAPre-anesthesia Checklist: Patient identified, Emergency Drugs available, Suction available, Timeout performed and Patient being monitored Patient Re-evaluated:Patient Re-evaluated prior to induction Oxygen Delivery Method: Nasal Cannula

## 2023-06-16 NOTE — Op Note (Signed)
Date of procedure: 06/16/23  Pre-operative diagnosis: Visually significant age-related nuclear cataract, Left Eye (H25.12)  Post-operative diagnosis: Visually significant age-related nuclear cataract, Left Eye  Procedure: Removal of cataract via phacoemulsification and insertion of intra-ocular lens Alcon SY60WF+22.5D into the capsular bag of the Left Eye  Attending surgeon: Ronal Fear, MD  Anesthesia: MAC, Topical Akten  Complications: None  Estimated Blood Loss: <22mL (minimal)  Specimens: None  Implants:  Implant Name Type Inv. Item Serial No. Manufacturer Lot No. LRB No. Used Action  CLAREON IOL Intraocular Lens  [id number] ALCON  Left 1 Implanted    Indications:  Visually significant age-related cataract, Left Eye  Procedure:  The patient was seen and identified in the pre-operative area. The operative eye was identified and dilated.  The operative eye was marked.  Topical anesthesia was administered to the operative eye.     The patient was then to the operative suite and placed in the supine position.  A timeout was performed confirming the patient, procedure to be performed, and all other relevant information.   The patient's face was prepped and draped in the usual fashion for intra-ocular surgery.  A lid speculum was placed into the operative eye and the surgical microscope moved into place and focused.  An inferotemporal paracentesis was created using a 20 gauge paracentesis blade.  BSS mixed with Omidria, followed by 1% lidocaine was injected into the anterior chamber.  Viscoelastic was injected into the anterior chamber.  A temporal clear-corneal main wound incision was created using a 2.35mm microkeratome.  A continuous curvilinear capsulorrhexis was initiated using an irrigating cystitome and completed using capsulorrhexis forceps.  Hydrodissection and hydrodeliniation were performed.  Viscoelastic was injected into the anterior chamber.  A phacoemulsification  handpiece and a chopper as a second instrument were used to remove the nucleus and epinucleus. The irrigation/aspiration handpiece was used to remove any remaining cortical material.   The capsular bag was reinflated with viscoelastic, checked, and found to be intact.  The intraocular lens was inserted into the capsular bag.  The irrigation/aspiration handpiece was used to remove any remaining viscoelastic.  The clear corneal wound and paracentesis wounds were then hydrated and checked with Weck-Cels to be watertight.  The lid-speculum and drape was removed, and the patient's face was cleaned with a wet and dry 4x4.  Maxitrol drops were instilled onto the eye. A clear shield was taped over the eye. The patient was taken to the post-operative care unit in good condition, having tolerated the procedure well.  Post-Op Instructions: The patient will follow up at [facility] for a same day post-operative evaluation and will receive all other orders and instructions.

## 2023-06-16 NOTE — Anesthesia Preprocedure Evaluation (Signed)
Anesthesia Evaluation  Patient identified by MRN, date of birth, ID band Patient awake    Reviewed: Allergy & Precautions, H&P , NPO status , Patient's Chart, lab work & pertinent test results  Airway Mallampati: II  TM Distance: >3 FB Neck ROM: Full    Dental no notable dental hx. (+) Edentulous Upper, Edentulous Lower   Pulmonary neg pulmonary ROS, former smoker   Pulmonary exam normal breath sounds clear to auscultation       Cardiovascular hypertension, Pt. on medications + Peripheral Vascular Disease (AAA repair)  Normal cardiovascular exam Rhythm:Regular Rate:Normal     Neuro/Psych  Neuromuscular disease  negative psych ROS   GI/Hepatic negative GI ROS, Neg liver ROS,,,  Endo/Other  negative endocrine ROSdiabetes, Well Controlled, Type 2, Insulin Dependent    Renal/GU Renal InsufficiencyRenal disease Bladder dysfunction (Prostate cancer)      Musculoskeletal  (+) Arthritis , Osteoarthritis,    Abdominal   Peds negative pediatric ROS (+)  Hematology negative hematology ROS (+)   Anesthesia Other Findings   Reproductive/Obstetrics negative OB ROS                             Anesthesia Physical Anesthesia Plan  ASA: 3  Anesthesia Plan: MAC   Post-op Pain Management: Minimal or no pain anticipated   Induction: Intravenous  PONV Risk Score and Plan: Treatment may vary due to age or medical condition  Airway Management Planned: Nasal Cannula and Natural Airway  Additional Equipment:   Intra-op Plan:   Post-operative Plan:   Informed Consent: I have reviewed the patients History and Physical, chart, labs and discussed the procedure including the risks, benefits and alternatives for the proposed anesthesia with the patient or authorized representative who has indicated his/her understanding and acceptance.     Dental advisory given  Plan Discussed with: CRNA and  Surgeon  Anesthesia Plan Comments:         Anesthesia Quick Evaluation

## 2023-06-16 NOTE — Transfer of Care (Signed)
Immediate Anesthesia Transfer of Care Note  Patient: Charles Medina  Procedure(s) Performed: CATARACT EXTRACTION PHACO AND INTRAOCULAR LENS PLACEMENT (IOC) (Left: Eye)  Patient Location: Short Stay  Anesthesia Type:MAC  Level of Consciousness: awake and patient cooperative  Airway & Oxygen Therapy: Patient Spontanous Breathing  Post-op Assessment: Report given to RN and Post -op Vital signs reviewed and stable  Post vital signs: Reviewed and stable  Last Vitals:  Vitals Value Taken Time  BP 158/73 1302  06/16/23  Temp 98.5 1302  06/16/23  Pulse 71 1302  06/16/23  Resp 16 1302  06/16/23   SpO2 100 1302  06/16/23    Last Pain:  Vitals:   06/16/23 1041  TempSrc: Oral  PainSc: 0-No pain         Complications: No notable events documented.

## 2023-06-16 NOTE — Discharge Instructions (Signed)
Please discharge patient when stable, will follow up today with Dr. Snyder at the [facility] office immediately following discharge.  Leave shield in place until visit.  All paperwork with discharge instructions will be given at the office.  [facility] Address:  [address]  [place], [place] [address]  Dr. Snyder's Phone: [phone]  

## 2023-06-19 NOTE — Telephone Encounter (Signed)
Called and left message to call office

## 2023-06-20 ENCOUNTER — Encounter (HOSPITAL_COMMUNITY): Payer: Self-pay | Admitting: Optometry

## 2023-06-20 NOTE — Telephone Encounter (Signed)
Called and spoke with patient. Patient advised per Dr Lorin Picket  More than likely tingling in the hands is related to possible carpal tunnel If it is not improving over the next couple weeks I recommend an office visit His glucose readings look good if his readings go lower I would recommend reducing insulin from 9 units down to 8 units otherwise may maintain as is Keep regular follow-ups  Patient verbalized understanding.

## 2023-07-12 ENCOUNTER — Telehealth: Payer: Self-pay | Admitting: Family Medicine

## 2023-07-12 NOTE — Telephone Encounter (Signed)
Patient came in today about his insulin what told at [facility] it couldn't be filled until 9/23 and he will be out  after tomorrow. Will he need new prescription ? Please advise

## 2023-07-13 NOTE — Telephone Encounter (Signed)
1.  Please try to explain the situation to [facility] 2.  Please connect with Vanice Sarah to see if she would have any recommendations of any low cost insulin that he could utilize for a few days or perhaps recommend should be changed to a different insulin to get it covered Or should he just go without insulin for couple weeks Obviously cost is an issue

## 2023-07-27 ENCOUNTER — Telehealth: Payer: Self-pay | Admitting: Family Medicine

## 2023-07-27 NOTE — Telephone Encounter (Signed)
Patient relates that he has a penile pump he is gena bring it by next week for me to take a look at so I can write him a prescription for this He will come by Monday at approximately 11:40 AM thank you have nurses show him to a room and I will see him for this issue  Patient has erectile dysfunction due to prostatectomy

## 2023-07-27 NOTE — Telephone Encounter (Signed)
Patient is wanting to speak with you after hours. He states very important wanting to speak directly to you

## 2023-07-28 ENCOUNTER — Other Ambulatory Visit: Payer: Self-pay | Admitting: Family Medicine

## 2023-08-02 ENCOUNTER — Other Ambulatory Visit: Payer: Self-pay | Admitting: Family Medicine

## 2023-08-04 ENCOUNTER — Other Ambulatory Visit: Payer: Self-pay | Admitting: Family Medicine

## 2023-08-10 ENCOUNTER — Telehealth: Payer: Self-pay | Admitting: Family Medicine

## 2023-08-10 NOTE — Telephone Encounter (Signed)
Called and spoke to patient and advised per Dr Lorin Picket you can call him let him know that I am working on the issue that he talked with me about if there is something else that he needs to bring up please talk with him regarding it but if that is regarding the issue that he came by I am waiting on a reply from urologist thank you so much Patient verbalized understanding.

## 2023-08-10 NOTE — Telephone Encounter (Signed)
Nurses you can call him let him know that I am working on the issue that he talked with me about if there is something else that he needs to bring up please talk with him regarding it but if that is regarding the issue that he came by I am waiting on a reply from urologist thank you so much  Feel free to send me follow-up

## 2023-08-10 NOTE — Telephone Encounter (Signed)
Patient called yesterday and would like for you to call him.

## 2023-08-17 ENCOUNTER — Encounter: Payer: Self-pay | Admitting: Family Medicine

## 2023-08-17 NOTE — Progress Notes (Signed)
Please do not mail this please put this letter on my desk or in my inbox I have additional information that needs to go with this thank you

## 2023-08-17 NOTE — Telephone Encounter (Signed)
A letter has been dictated, please forward this to me on my desk and I will put some additional information with it then he can pick this up next week thank you

## 2023-09-11 DIAGNOSIS — N1831 Chronic kidney disease, stage 3a: Secondary | ICD-10-CM | POA: Diagnosis not present

## 2023-09-11 DIAGNOSIS — I10 Essential (primary) hypertension: Secondary | ICD-10-CM | POA: Diagnosis not present

## 2023-09-11 DIAGNOSIS — E1169 Type 2 diabetes mellitus with other specified complication: Secondary | ICD-10-CM | POA: Diagnosis not present

## 2023-09-11 DIAGNOSIS — Z794 Long term (current) use of insulin: Secondary | ICD-10-CM | POA: Diagnosis not present

## 2023-09-11 DIAGNOSIS — E1122 Type 2 diabetes mellitus with diabetic chronic kidney disease: Secondary | ICD-10-CM | POA: Diagnosis not present

## 2023-09-11 DIAGNOSIS — E785 Hyperlipidemia, unspecified: Secondary | ICD-10-CM | POA: Diagnosis not present

## 2023-09-12 LAB — BASIC METABOLIC PANEL
BUN/Creatinine Ratio: 14 (ref 10–24)
BUN: 29 mg/dL — ABNORMAL HIGH (ref 8–27)
CO2: 23 mmol/L (ref 20–29)
Calcium: 9.6 mg/dL (ref 8.6–10.2)
Chloride: 105 mmol/L (ref 96–106)
Creatinine, Ser: 2.05 mg/dL — ABNORMAL HIGH (ref 0.76–1.27)
Glucose: 121 mg/dL — ABNORMAL HIGH (ref 70–99)
Potassium: 4.9 mmol/L (ref 3.5–5.2)
Sodium: 141 mmol/L (ref 134–144)
eGFR: 31 mL/min/{1.73_m2} — ABNORMAL LOW (ref >59)

## 2023-09-12 LAB — LIPID PANEL
Chol/HDL Ratio: 2.9 {ratio} (ref 0.0–5.0)
Cholesterol, Total: 129 mg/dL (ref 100–199)
HDL: 45 mg/dL (ref >39)
LDL Chol Calc (NIH): 73 mg/dL (ref 0–99)
Triglycerides: 47 mg/dL (ref 0–149)
VLDL Cholesterol Cal: 11 mg/dL (ref 5–40)

## 2023-09-12 LAB — HEMOGLOBIN A1C
Est. average glucose Bld gHb Est-mCnc: 160 mg/dL
Hgb A1c MFr Bld: 7.2 % — ABNORMAL HIGH (ref 4.8–5.6)

## 2023-09-19 ENCOUNTER — Ambulatory Visit (INDEPENDENT_AMBULATORY_CARE_PROVIDER_SITE_OTHER): Payer: Medicare HMO | Admitting: Family Medicine

## 2023-09-19 ENCOUNTER — Encounter: Payer: Self-pay | Admitting: Family Medicine

## 2023-09-19 VITALS — BP 142/74 | HR 56 | Ht 71.0 in | Wt 152.0 lb

## 2023-09-19 DIAGNOSIS — E1122 Type 2 diabetes mellitus with diabetic chronic kidney disease: Secondary | ICD-10-CM

## 2023-09-19 DIAGNOSIS — E785 Hyperlipidemia, unspecified: Secondary | ICD-10-CM | POA: Diagnosis not present

## 2023-09-19 DIAGNOSIS — I10 Essential (primary) hypertension: Secondary | ICD-10-CM

## 2023-09-19 DIAGNOSIS — Z794 Long term (current) use of insulin: Secondary | ICD-10-CM

## 2023-09-19 DIAGNOSIS — E1169 Type 2 diabetes mellitus with other specified complication: Secondary | ICD-10-CM | POA: Diagnosis not present

## 2023-09-19 DIAGNOSIS — N1831 Chronic kidney disease, stage 3a: Secondary | ICD-10-CM

## 2023-09-19 NOTE — Progress Notes (Signed)
Subjective:    Patient ID: Charles Medina, male    DOB: 04/01/1941, 82 y.o.   MRN: [id number]  Discussed the use of AI scribe software for clinical note transcription with the patient, who gave verbal consent to proceed.  History of Present Illness   The patient, a 82 year old with a history of diabetes, has been managing his health well with no recent complications. He reports maintaining an active lifestyle, including yard work and regular gym visits. His blood sugar levels have been stable, ranging from 97 to 102. He has been on an 8-unit insulin regimen for a long time, with a desire to reduce the dosage further. His appetite is good and he maintains a healthy diet, primarily consisting of chicken, [place], and fish.  The patient has been taking atorvastatin for cholesterol management, but there has been an issue with the pharmacy regarding a three-month supply. He is also on amlodipine for blood pressure control, but there seems to be a similar issue with the pharmacy regarding the supply. In the interim, he has been consuming beet juice as a natural remedy.  The patient's physical activity includes regular walking and gym visits three times a week. His weight has been steady and even improved compared to earlier in the year. Bowel movements and urination have been normal. Recent blood work showed good cholesterol levels and an improved A1c from 7.5 to 7.2. Kidney function has remained steady.  The patient has a history of seeing a kidney specialist, but he is not currently under their care. The patient's kidney function is being monitored and if it worsens, he may need to see the specialist again.         Review of Systems     Objective:    Physical Exam   VITALS: BP- 144/74 CHEST: Breath sounds clear. CARDIOVASCULAR: Heart sounds normal.           Assessment & Plan:  Assessment and Plan    Diabetes Mellitus Blood glucose levels are well controlled with morning readings  between 97-102. Currently on 8 units of insulin daily. -Continue current insulin regimen.  Hyperlipidemia Patient reports difficulty obtaining Atorvastatin due to pharmacy supply issues. -Contact pharmacy to clarify and resolve medication supply issue.  Hypertension Patient reports difficulty obtaining Amlodipine due to pharmacy supply issues. -Contact pharmacy to clarify and resolve medication supply issue.  General Health Maintenance Patient is physically active, regularly attending the gym and maintaining a healthy diet. Recent blood work shows good cholesterol control and improved HbA1c (7.2 from 7.5). -Continue current lifestyle modifications. -Plan for follow-up in four months with blood work.      1. Primary hypertension HTN- patient seen for follow-up regarding HTN.   Diet, medication compliance, appropriate labs and refills were completed.   Importance of keeping blood pressure under good control to lessen the risk of complications discussed Regular follow-up visits discussed   2. Type 2 diabetes mellitus with stage 3a chronic kidney disease, with long-term current use of insulin (HCC) The patient was seen today as part of a comprehensive visit for diabetes. The importance of keeping her A1c at or below 7 range was discussed.  Discussed diet, activity, and medication compliance Emphasized healthy eating primarily with vegetables fruits and if utilizing meats lean meats such as chicken or fish grilled baked broiled Avoid sugary drinks Minimize and avoid processed foods Fit in regular physical activity preferably 25 to 30 minutes 4 times per week Standard follow-up visit recommended.  Patient aware lack of  control and follow-up increases risk of diabetic complications. Regular follow-up visits Yearly ophthalmology Yearly foot exam   3. Hyperlipidemia associated with type 2 diabetes mellitus (HCC) Hyperlipidemia-importance of diet, weight control, activity, compliance with  medications discussed.   Recent labs reviewed.   Any additional labs or refills ordered.   Importance of keeping under good control discussed. Regular follow-up visits discussed   Follow-up within 4 months

## 2023-09-21 ENCOUNTER — Other Ambulatory Visit: Payer: Self-pay | Admitting: Family Medicine

## 2023-10-26 ENCOUNTER — Telehealth: Payer: Self-pay

## 2023-10-26 MED ORDER — ACCU-CHEK GUIDE TEST VI STRP: ORAL_STRIP | 12 refills | Status: AC

## 2023-10-26 NOTE — Telephone Encounter (Signed)
Prescription Request  10/26/2023  LOV: Visit date not found  What is the name of the medication or equipment? ACCU-CHEK GUIDE test strip   Have you contacted your pharmacy to request a refill? Yes   Which pharmacy would you like this sent to? [facility]  Patient notified that their request is being sent to the clinical staff for review and that they should receive a response within 2 business days.   Please advise at Mobile [phone] (mobile)

## 2023-11-24 ENCOUNTER — Other Ambulatory Visit: Payer: Self-pay | Admitting: Family Medicine

## 2023-12-30 ENCOUNTER — Other Ambulatory Visit: Payer: Self-pay | Admitting: Family Medicine

## 2024-01-17 ENCOUNTER — Ambulatory Visit (INDEPENDENT_AMBULATORY_CARE_PROVIDER_SITE_OTHER): Payer: Medicare HMO | Admitting: Family Medicine

## 2024-01-17 ENCOUNTER — Encounter: Payer: Self-pay | Admitting: Family Medicine

## 2024-01-17 VITALS — BP 144/64 | HR 69 | Temp 98.2°F | Ht 71.0 in | Wt 152.6 lb

## 2024-01-17 DIAGNOSIS — E1169 Type 2 diabetes mellitus with other specified complication: Secondary | ICD-10-CM

## 2024-01-17 DIAGNOSIS — E785 Hyperlipidemia, unspecified: Secondary | ICD-10-CM | POA: Diagnosis not present

## 2024-01-17 DIAGNOSIS — Z79899 Other long term (current) drug therapy: Secondary | ICD-10-CM | POA: Diagnosis not present

## 2024-01-17 DIAGNOSIS — N1831 Chronic kidney disease, stage 3a: Secondary | ICD-10-CM | POA: Diagnosis not present

## 2024-01-17 DIAGNOSIS — E1122 Type 2 diabetes mellitus with diabetic chronic kidney disease: Secondary | ICD-10-CM | POA: Diagnosis not present

## 2024-01-17 DIAGNOSIS — E1121 Type 2 diabetes mellitus with diabetic nephropathy: Secondary | ICD-10-CM

## 2024-01-17 DIAGNOSIS — Z794 Long term (current) use of insulin: Secondary | ICD-10-CM | POA: Diagnosis not present

## 2024-01-17 DIAGNOSIS — I10 Essential (primary) hypertension: Secondary | ICD-10-CM | POA: Diagnosis not present

## 2024-01-17 MED ORDER — ATORVASTATIN CALCIUM 80 MG PO TABS: 80.0000 mg | ORAL_TABLET | Freq: Every day | ORAL | 0 refills | Status: DC

## 2024-01-17 MED ORDER — ATORVASTATIN CALCIUM 80 MG PO TABS: 80.0000 mg | ORAL_TABLET | Freq: Every day | ORAL | 3 refills | Status: AC

## 2024-01-17 MED ORDER — AMLODIPINE BESYLATE 10 MG PO TABS: 10.0000 mg | ORAL_TABLET | Freq: Every day | ORAL | 1 refills | Status: DC

## 2024-01-17 MED ORDER — TELMISARTAN 20 MG PO TABS: 20.0000 mg | ORAL_TABLET | Freq: Every day | ORAL | 1 refills | Status: DC

## 2024-01-17 NOTE — Progress Notes (Signed)
   Subjective:    Patient ID: Charles Medina, male    DOB: [birth year], 83 y.o.   MRN: [id number]  Discussed the use of AI scribe software for clinical note transcription with the patient, who gave verbal consent to proceed.  History of Present Illness   The patient is an 83 year old with diabetes and hypertension who presents for routine follow-up.  His blood sugar was 121 mg/dL this morning. No hypoglycemic episodes, with levels not dropping below 80 mg/dL. He is currently taking 8 units of insulin and maintains a regular diet, primarily consuming [place], chicken, and fish.  He is taking amlodipine, atorvastatin, and telmisartan regularly for cholesterol and blood pressure management. No swelling in his legs and good urine flow.  He exercises regularly, going to the gym three times a week, and feels energetic. No recent falls or injuries. He is able to walk around and drive without issues. He also mentions flying and having a dog that he walks.  He shares that his son-in-law recently had a stroke and is in poor health, which has been a source of concern for him and his family. His daughter, who lives in [place], is also mentioned in the context of family support and travel plans.         Review of Systems     Objective:    Physical Exam   VITALS: BP- 160/73     General-in no acute distress Eyes-no discharge Lungs-respiratory rate normal, CTA CV-no murmurs,RRR Extremities skin warm dry no edema Neuro grossly normal Behavior normal, alert Blood pressure recheck 144/64      Assessment & Plan:  Assessment and Plan    Hypertension Blood pressure improved from 160/73 mmHg to 144/64 mmHg with current medication regimen. - Continue amlodipine and telmisartan. - Regular blood pressure monitoring.  Type 2 Diabetes Mellitus Morning blood sugar at 121 mg/dL, stable with 8 units of insulin and no hypoglycemic episodes. - Continue 8 units of insulin. - Maintain regular diet and  exercise routine.  Hyperlipidemia Compliant with atorvastatin for cholesterol management. - Continue atorvastatin.  General Health Maintenance Compliant with medication regimen for all conditions. - Perform blood and urine tests in May. - Schedule follow-up appointment in early August.     Patient not having any low sugar spells Tolerating cholesterol medicine daily Taking his amlodipine daily And also taking his telmisartan Will monitor closely Patient will do lab work before next follow-up visit Patient will follow-up sooner if any problems I do not recommend increasing blood pressure medicine currently and less than numbers become higher

## 2024-03-28 DIAGNOSIS — E1122 Type 2 diabetes mellitus with diabetic chronic kidney disease: Secondary | ICD-10-CM | POA: Diagnosis not present

## 2024-03-28 DIAGNOSIS — E785 Hyperlipidemia, unspecified: Secondary | ICD-10-CM | POA: Diagnosis not present

## 2024-03-28 DIAGNOSIS — Z79899 Other long term (current) drug therapy: Secondary | ICD-10-CM | POA: Diagnosis not present

## 2024-03-28 DIAGNOSIS — Z794 Long term (current) use of insulin: Secondary | ICD-10-CM | POA: Diagnosis not present

## 2024-03-28 DIAGNOSIS — E1169 Type 2 diabetes mellitus with other specified complication: Secondary | ICD-10-CM | POA: Diagnosis not present

## 2024-03-28 DIAGNOSIS — I10 Essential (primary) hypertension: Secondary | ICD-10-CM | POA: Diagnosis not present

## 2024-03-28 DIAGNOSIS — N1831 Chronic kidney disease, stage 3a: Secondary | ICD-10-CM | POA: Diagnosis not present

## 2024-03-29 ENCOUNTER — Other Ambulatory Visit: Payer: Self-pay | Admitting: Family Medicine

## 2024-03-29 LAB — BASIC METABOLIC PANEL WITH GFR
BUN/Creatinine Ratio: 14 (ref 10–24)
BUN: 28 mg/dL — ABNORMAL HIGH (ref 8–27)
CO2: 21 mmol/L (ref 20–29)
Calcium: 9.5 mg/dL (ref 8.6–10.2)
Chloride: 102 mmol/L (ref 96–106)
Creatinine, Ser: 1.94 mg/dL — ABNORMAL HIGH (ref 0.76–1.27)
Glucose: 132 mg/dL — ABNORMAL HIGH (ref 70–99)
Potassium: 5 mmol/L (ref 3.5–5.2)
Sodium: 137 mmol/L (ref 134–144)
eGFR: 34 mL/min/{1.73_m2} — ABNORMAL LOW (ref >59)

## 2024-03-29 LAB — LIPID PANEL
Chol/HDL Ratio: 3.1 ratio (ref 0.0–5.0)
Cholesterol, Total: 115 mg/dL (ref 100–199)
HDL: 37 mg/dL — ABNORMAL LOW (ref >39)
LDL Chol Calc (NIH): 66 mg/dL (ref 0–99)
Triglycerides: 52 mg/dL (ref 0–149)
VLDL Cholesterol Cal: 12 mg/dL (ref 5–40)

## 2024-03-29 LAB — HEPATIC FUNCTION PANEL
ALT: 18 IU/L (ref 0–44)
AST: 16 IU/L (ref 0–40)
Albumin: 4.3 g/dL (ref 3.7–4.7)
Alkaline Phosphatase: 111 IU/L (ref 44–121)
Bilirubin Total: 0.4 mg/dL (ref 0.0–1.2)
Bilirubin, Direct: 0.14 mg/dL (ref 0.00–0.40)
Total Protein: 7 g/dL (ref 6.0–8.5)

## 2024-03-29 LAB — MICROALBUMIN / CREATININE URINE RATIO
Creatinine, Urine: 119.9 mg/dL
Microalb/Creat Ratio: 186 mg/g{creat} — ABNORMAL HIGH (ref 0–29)
Microalbumin, Urine: 222.8 ug/mL

## 2024-03-29 LAB — HEMOGLOBIN A1C
Est. average glucose Bld gHb Est-mCnc: 174 mg/dL
Hgb A1c MFr Bld: 7.7 % — ABNORMAL HIGH (ref 4.8–5.6)

## 2024-03-31 ENCOUNTER — Ambulatory Visit: Payer: Self-pay | Admitting: Family Medicine

## 2024-06-19 ENCOUNTER — Ambulatory Visit (INDEPENDENT_AMBULATORY_CARE_PROVIDER_SITE_OTHER): Admitting: Family Medicine

## 2024-06-19 ENCOUNTER — Encounter: Payer: Self-pay | Admitting: Family Medicine

## 2024-06-19 VITALS — BP 138/74 | HR 91 | Temp 97.7°F | Ht 71.0 in | Wt 155.0 lb

## 2024-06-19 DIAGNOSIS — E785 Hyperlipidemia, unspecified: Secondary | ICD-10-CM

## 2024-06-19 DIAGNOSIS — E1169 Type 2 diabetes mellitus with other specified complication: Secondary | ICD-10-CM

## 2024-06-19 DIAGNOSIS — Z8546 Personal history of malignant neoplasm of prostate: Secondary | ICD-10-CM | POA: Diagnosis not present

## 2024-06-19 DIAGNOSIS — N1831 Chronic kidney disease, stage 3a: Secondary | ICD-10-CM | POA: Diagnosis not present

## 2024-06-19 DIAGNOSIS — E1121 Type 2 diabetes mellitus with diabetic nephropathy: Secondary | ICD-10-CM | POA: Diagnosis not present

## 2024-06-19 DIAGNOSIS — I10 Essential (primary) hypertension: Secondary | ICD-10-CM

## 2024-06-19 DIAGNOSIS — Z79899 Other long term (current) drug therapy: Secondary | ICD-10-CM | POA: Diagnosis not present

## 2024-06-19 DIAGNOSIS — E1122 Type 2 diabetes mellitus with diabetic chronic kidney disease: Secondary | ICD-10-CM

## 2024-06-19 DIAGNOSIS — Z794 Long term (current) use of insulin: Secondary | ICD-10-CM

## 2024-06-19 NOTE — Progress Notes (Signed)
 Subjective:    Patient ID: Charles Medina, male    DOB: Mar 20, 1941, 83 y.o.   MRN: [id number]  HPI  Discussed the use of AI scribe software for clinical note transcription with the patient, who gave verbal consent to proceed.  History of Present Illness   Charles Medina is an 83 year old male with diabetes who presents for a routine check-up and diabetes management.  He generally sleeps well, going to bed when it gets dark and waking up at 5 AM to walk his dog. Occasionally, he wakes up around 2 or 3 AM to urinate but is able to return to sleep easily. He walks about a mile daily and exercises on Monday, Wednesday, and Friday. He maintains a diet consisting mainly of chicken, [place], and fish, avoiding pork.  He denies any current health concerns or worries. He is compliant with his medications. His most recent A1c was 7.7, previously 7.2. He has a history of diabetes and is on medication to protect his kidneys.  He has regular eye check-ups and is planning to get his flu shot in the fall. No issues with his mood and maintains a positive outlook. No recent falls or injuries and stays active by cutting grass.      Patient for blood pressure check up.  The patient does have hypertension.   Patient relates dietary measures try to minimize salt The importance of healthy diet and activity were discussed Patient relates compliance  The patient was seen today as part of a comprehensive diabetic check up. Patient has diabetes Patient relates good compliance with taking the medication. We discussed their diet and exercise activities  We also discussed the importance of notifying us  if any excessively high glucoses or low sugars.     Review of Systems     Objective:   Physical Exam General-in no acute distress Eyes-no discharge Lungs-respiratory rate normal, CTA CV-no murmurs,RRR Extremities skin warm dry no edema Neuro grossly normal Behavior normal, alert ,this Results for orders  placed or performed in visit on 01/17/24  Hemoglobin A1c   Collection Time: 03/28/24  8:49 AM  Result Value Ref Range   Hgb A1c MFr Bld 7.7 (H) 4.8 - 5.6 %   Est. average glucose Bld gHb Est-mCnc 174 mg/dL  Basic Metabolic Panel   Collection Time: 03/28/24  8:49 AM  Result Value Ref Range   Glucose 132 (H) 70 - 99 mg/dL   BUN 28 (H) 8 - 27 mg/dL   Creatinine, Ser 8.05 (H) 0.76 - 1.27 mg/dL   eGFR 34 (L) >40 fO/fpw/8.26   BUN/Creatinine Ratio 14 10 - 24   Sodium 137 134 - 144 mmol/L   Potassium 5.0 3.5 - 5.2 mmol/L   Chloride 102 96 - 106 mmol/L   CO2 21 20 - 29 mmol/L   Calcium  9.5 8.6 - 10.2 mg/dL  Lipid Panel   Collection Time: 03/28/24  8:49 AM  Result Value Ref Range   Cholesterol, Total 115 100 - 199 mg/dL   Triglycerides 52 0 - 149 mg/dL   HDL 37 (L) >60 mg/dL   VLDL Cholesterol Cal 12 5 - 40 mg/dL   LDL Chol Calc (NIH) 66 0 - 99 mg/dL   Chol/HDL Ratio 3.1 0.0 - 5.0 ratio  Hepatic Function Panel   Collection Time: 03/28/24  8:49 AM  Result Value Ref Range   Total Protein 7.0 6.0 - 8.5 g/dL   Albumin 4.3 3.7 - 4.7 g/dL   Bilirubin Total  0.4 0.0 - 1.2 mg/dL   Bilirubin, Direct 9.85 0.00 - 0.40 mg/dL   Alkaline Phosphatase 111 44 - 121 IU/L   AST 16 0 - 40 IU/L   ALT 18 0 - 44 IU/L  Microalbumin/Creatinine Ratio, Urine   Collection Time: 03/28/24  8:49 AM  Result Value Ref Range   Creatinine, Urine 119.9 Not Estab. mg/dL   Microalbumin, Urine 777.1 Not Estab. ug/mL   Microalb/Creat Ratio 186 (H) 0 - 29 mg/g creat          Assessment & Plan:  1. Primary hypertension (Primary) Blood pressure good control continue current measures.  On ARB. - Microalbumin/Creatinine Ratio, Urine  2. Type 2 diabetes mellitus with stage 3a chronic kidney disease, with long-term current use of insulin  (HCC) Watch starches in diet check lab work before the end of the year - Hemoglobin A1c - Basic Metabolic Panel  3. Hyperlipidemia associated with type 2 diabetes mellitus  (HCC) Continue medication check lab work before the end of the year - Lipid Panel  4. Diabetic nephropathy associated with type 2 diabetes mellitus (HCC) Check lab work in December recheck urine protein if it is going up consider adding low-dose Jardiance or Farxiga - Hemoglobin A1c - Microalbumin/Creatinine Ratio, Urine  5. History of prostate cancer Check PSA original prostate cancer 2011 - PSA  6. High risk medication use Labs ordered - Hepatic Function Panel  7. Type 2 diabetes mellitus with stage 3a chronic kidney disease, without long-term current use of insulin  (HCC) See above  Patient to do follow-up in 4 to 5 months lab work in about 4 months

## 2024-06-27 ENCOUNTER — Other Ambulatory Visit: Payer: Self-pay

## 2024-06-27 MED ORDER — AMLODIPINE BESYLATE 10 MG PO TABS: 10.0000 mg | ORAL_TABLET | Freq: Every day | ORAL | 1 refills | Status: AC

## 2024-07-12 ENCOUNTER — Other Ambulatory Visit: Payer: Self-pay | Admitting: Family Medicine

## 2024-08-02 ENCOUNTER — Other Ambulatory Visit: Payer: Self-pay | Admitting: Family Medicine

## 2024-09-13 ENCOUNTER — Ambulatory Visit

## 2024-09-13 DIAGNOSIS — Z794 Long term (current) use of insulin: Secondary | ICD-10-CM | POA: Diagnosis not present

## 2024-09-13 DIAGNOSIS — E785 Hyperlipidemia, unspecified: Secondary | ICD-10-CM | POA: Diagnosis not present

## 2024-09-13 DIAGNOSIS — Z79899 Other long term (current) drug therapy: Secondary | ICD-10-CM | POA: Diagnosis not present

## 2024-09-13 DIAGNOSIS — E1121 Type 2 diabetes mellitus with diabetic nephropathy: Secondary | ICD-10-CM | POA: Diagnosis not present

## 2024-09-13 DIAGNOSIS — E1122 Type 2 diabetes mellitus with diabetic chronic kidney disease: Secondary | ICD-10-CM | POA: Diagnosis not present

## 2024-09-13 DIAGNOSIS — Z8546 Personal history of malignant neoplasm of prostate: Secondary | ICD-10-CM | POA: Diagnosis not present

## 2024-09-13 DIAGNOSIS — I10 Essential (primary) hypertension: Secondary | ICD-10-CM | POA: Diagnosis not present

## 2024-09-13 DIAGNOSIS — E1169 Type 2 diabetes mellitus with other specified complication: Secondary | ICD-10-CM | POA: Diagnosis not present

## 2024-09-13 DIAGNOSIS — N1831 Chronic kidney disease, stage 3a: Secondary | ICD-10-CM | POA: Diagnosis not present

## 2024-09-15 LAB — HEPATIC FUNCTION PANEL
ALT: 17 IU/L (ref 0–44)
AST: 18 IU/L (ref 0–40)
Albumin: 4.3 g/dL (ref 3.7–4.7)
Alkaline Phosphatase: 107 IU/L (ref 48–129)
Bilirubin Total: 0.5 mg/dL (ref 0.0–1.2)
Bilirubin, Direct: 0.17 mg/dL (ref 0.00–0.40)
Total Protein: 7.2 g/dL (ref 6.0–8.5)

## 2024-09-15 LAB — LIPID PANEL
Chol/HDL Ratio: 3.7 ratio (ref 0.0–5.0)
Cholesterol, Total: 117 mg/dL (ref 100–199)
HDL: 32 mg/dL — ABNORMAL LOW (ref >39)
LDL Chol Calc (NIH): 74 mg/dL (ref 0–99)
Triglycerides: 46 mg/dL (ref 0–149)
VLDL Cholesterol Cal: 11 mg/dL (ref 5–40)

## 2024-09-15 LAB — BASIC METABOLIC PANEL WITH GFR
BUN/Creatinine Ratio: 13 (ref 10–24)
BUN: 24 mg/dL (ref 8–27)
CO2: 21 mmol/L (ref 20–29)
Calcium: 9.4 mg/dL (ref 8.6–10.2)
Chloride: 103 mmol/L (ref 96–106)
Creatinine, Ser: 1.91 mg/dL — ABNORMAL HIGH (ref 0.76–1.27)
Glucose: 135 mg/dL — ABNORMAL HIGH (ref 70–99)
Potassium: 5.5 mmol/L — ABNORMAL HIGH (ref 3.5–5.2)
Sodium: 137 mmol/L (ref 134–144)
eGFR: 34 mL/min/1.73 — ABNORMAL LOW (ref >59)

## 2024-09-15 LAB — MICROALBUMIN / CREATININE URINE RATIO
Creatinine, Urine: 197.4 mg/dL
Microalb/Creat Ratio: 218 mg/g{creat} — AB (ref 0–29)
Microalbumin, Urine: 430.3 ug/mL

## 2024-09-15 LAB — PSA: Prostate Specific Ag, Serum: 2.1 ng/mL (ref 0.0–4.0)

## 2024-09-15 LAB — HEMOGLOBIN A1C
Est. average glucose Bld gHb Est-mCnc: 169 mg/dL
Hgb A1c MFr Bld: 7.5 % — ABNORMAL HIGH (ref 4.8–5.6)

## 2024-09-17 ENCOUNTER — Ambulatory Visit: Payer: Self-pay | Admitting: Family Medicine

## 2024-10-21 ENCOUNTER — Other Ambulatory Visit: Payer: Self-pay

## 2024-10-21 ENCOUNTER — Encounter: Payer: Self-pay | Admitting: Family Medicine

## 2024-10-21 ENCOUNTER — Ambulatory Visit: Admitting: Family Medicine

## 2024-10-21 VITALS — BP 128/62 | HR 72 | Temp 97.7°F | Ht 71.0 in | Wt 156.0 lb

## 2024-10-21 DIAGNOSIS — I10 Essential (primary) hypertension: Secondary | ICD-10-CM

## 2024-10-21 DIAGNOSIS — R972 Elevated prostate specific antigen [PSA]: Secondary | ICD-10-CM

## 2024-10-21 DIAGNOSIS — Z8546 Personal history of malignant neoplasm of prostate: Secondary | ICD-10-CM

## 2024-10-21 DIAGNOSIS — N1831 Chronic kidney disease, stage 3a: Secondary | ICD-10-CM

## 2024-10-21 DIAGNOSIS — E1121 Type 2 diabetes mellitus with diabetic nephropathy: Secondary | ICD-10-CM

## 2024-10-21 DIAGNOSIS — E1122 Type 2 diabetes mellitus with diabetic chronic kidney disease: Secondary | ICD-10-CM

## 2024-10-21 DIAGNOSIS — E1169 Type 2 diabetes mellitus with other specified complication: Secondary | ICD-10-CM

## 2024-10-21 NOTE — Progress Notes (Signed)
° °  Subjective:    Patient ID: Charles Medina, male    DOB: 29-May-1941, 83 y.o.   MRN: [id number]  HPI Diabetes follow up  No concerns Discussed the use of AI scribe software for clinical note transcription with the patient, who gave verbal consent to proceed. Charles Medina is an 83 year old male with chronic kidney disease who presents for follow-up of elevated PSA levels and kidney function monitoring.  His PSA levels have recently increased to 2.1. He had prostate surgery in the past and has been followed for his PSA levels, which had remained low until recently.  He has a history of chronic kidney disease.  He manages his diabetes with insulin , currently taking eight units per day, and has no episodes of hypoglycemia. His A1c is under decent control at 7.5.  He maintains an active lifestyle, walking about a mile daily with his dog and occasionally going to the gym. He reports good energy levels, regular bowel movements, and adequate urinary function, drinking plenty of water  throughout the day.  Review of Systems     Objective:   Physical Exam General-in no acute distress Eyes-no discharge Lungs-respiratory rate normal, CTA CV-no murmurs,RRR Extremities skin warm dry no edema Neuro grossly normal Behavior normal, alert        Assessment & Plan:  1. Primary hypertension (Primary) Blood pressure decent control continue current measures  2. Type 2 diabetes mellitus with stage 3a chronic kidney disease, with long-term current use of insulin  (HCC) A1c previously under good control reasonable control currently he is on insulin  but will try to avoid low sugar spells  3. Hyperlipidemia associated with type 2 diabetes mellitus (HCC) Cholesterol reasonable control continue statin  4. History of prostate cancer PSA recently has been coming up Needs urology consultation Had previous prostate cancer with prostatectomy  5. Elevated PSA See discussion above patient not having any  symptoms but needs further evaluation  6. Type 2 diabetes mellitus with stage 3b chronic kidney disease, without long-term current use of insulin  (HCC) Urine protein is starting to come up in addition to this have noted creatinine being elevated Needs further evaluation by nephrology Currently he is taking ARB Have held off on SGLT2  Follow-up again in approximately 5 to 6 months follow-up sooner problems

## 2024-10-29 NOTE — Progress Notes (Signed)
 Pharmacy Quality Measure Review  This patient is appearing on a report for being at risk of failing the adherence measure for cholesterol (statin) medications this calendar year.   Medication: Atorvastatin  80 mg Last fill date: 10/09/24 for 90 day supply, sold 10/09/24  Insurance report was not up to date. No action needed at this time.   Jenkins Graces, PharmD PGY1 Pharmacy Resident

## 2024-11-08 ENCOUNTER — Other Ambulatory Visit: Payer: Self-pay | Admitting: Family Medicine

## 2025-04-21 ENCOUNTER — Ambulatory Visit: Admitting: Family Medicine
